# Patient Record
Sex: Male | Born: 1940 | Race: White | Hispanic: No | Marital: Married | Smoking: Former smoker
Health system: Southern US, Community
[De-identification: ages and names within clinical notes are randomized; demographics above are authoritative.]

## PROBLEM LIST (undated history)

## (undated) DIAGNOSIS — M1990 Unspecified osteoarthritis, unspecified site: Secondary | ICD-10-CM

## (undated) DIAGNOSIS — N390 Urinary tract infection, site not specified: Secondary | ICD-10-CM

## (undated) DIAGNOSIS — I10 Essential (primary) hypertension: Secondary | ICD-10-CM

## (undated) DIAGNOSIS — G510 Bell's palsy: Secondary | ICD-10-CM

## (undated) DIAGNOSIS — D3610 Benign neoplasm of peripheral nerves and autonomic nervous system, unspecified: Secondary | ICD-10-CM

## (undated) DIAGNOSIS — H9190 Unspecified hearing loss, unspecified ear: Secondary | ICD-10-CM

## (undated) DIAGNOSIS — C801 Malignant (primary) neoplasm, unspecified: Secondary | ICD-10-CM

## (undated) DIAGNOSIS — N400 Enlarged prostate without lower urinary tract symptoms: Secondary | ICD-10-CM

## (undated) DIAGNOSIS — R42 Dizziness and giddiness: Secondary | ICD-10-CM

## (undated) DIAGNOSIS — N1330 Unspecified hydronephrosis: Secondary | ICD-10-CM

## (undated) DIAGNOSIS — Z87448 Personal history of other diseases of urinary system: Secondary | ICD-10-CM

## (undated) DIAGNOSIS — R350 Frequency of micturition: Secondary | ICD-10-CM

## (undated) DIAGNOSIS — G839 Paralytic syndrome, unspecified: Secondary | ICD-10-CM

## (undated) DIAGNOSIS — Z87898 Personal history of other specified conditions: Secondary | ICD-10-CM

## (undated) DIAGNOSIS — N200 Calculus of kidney: Secondary | ICD-10-CM

## (undated) HISTORY — DX: Personal history of other diseases of urinary system: Z87.448

## (undated) HISTORY — PX: EYE SURGERY: SHX253

## (undated) HISTORY — PX: TRANSURETHRAL RESECTION OF BLADDER TUMOR WITH GYRUS (TURBT-GYRUS): SHX6458

## (undated) HISTORY — DX: Calculus of kidney: N20.0

## (undated) HISTORY — DX: Urinary tract infection, site not specified: N39.0

## (undated) HISTORY — PX: BACK SURGERY: SHX140

## (undated) HISTORY — DX: Personal history of other specified conditions: Z87.898

## (undated) HISTORY — DX: Frequency of micturition: R35.0

## (undated) HISTORY — DX: Unspecified hydronephrosis: N13.30

## (undated) HISTORY — PX: OTHER SURGICAL HISTORY: SHX169

---

## 2010-09-07 HISTORY — PX: BRAIN TUMOR EXCISION: SHX577

## 2011-09-17 DIAGNOSIS — Z981 Arthrodesis status: Secondary | ICD-10-CM | POA: Diagnosis not present

## 2011-09-17 DIAGNOSIS — M5030 Other cervical disc degeneration, unspecified cervical region: Secondary | ICD-10-CM | POA: Diagnosis not present

## 2011-09-17 DIAGNOSIS — M4802 Spinal stenosis, cervical region: Secondary | ICD-10-CM | POA: Diagnosis not present

## 2011-09-21 DIAGNOSIS — R7301 Impaired fasting glucose: Secondary | ICD-10-CM | POA: Diagnosis not present

## 2011-09-21 DIAGNOSIS — G510 Bell's palsy: Secondary | ICD-10-CM | POA: Diagnosis not present

## 2011-09-21 DIAGNOSIS — E119 Type 2 diabetes mellitus without complications: Secondary | ICD-10-CM | POA: Diagnosis not present

## 2011-09-21 DIAGNOSIS — I10 Essential (primary) hypertension: Secondary | ICD-10-CM | POA: Diagnosis not present

## 2011-09-21 DIAGNOSIS — C679 Malignant neoplasm of bladder, unspecified: Secondary | ICD-10-CM | POA: Diagnosis not present

## 2011-09-25 DIAGNOSIS — C679 Malignant neoplasm of bladder, unspecified: Secondary | ICD-10-CM | POA: Diagnosis not present

## 2011-10-08 DIAGNOSIS — H16109 Unspecified superficial keratitis, unspecified eye: Secondary | ICD-10-CM | POA: Diagnosis not present

## 2011-10-08 DIAGNOSIS — H2510 Age-related nuclear cataract, unspecified eye: Secondary | ICD-10-CM | POA: Diagnosis not present

## 2011-10-15 DIAGNOSIS — Z4789 Encounter for other orthopedic aftercare: Secondary | ICD-10-CM | POA: Diagnosis not present

## 2011-12-14 DIAGNOSIS — M5412 Radiculopathy, cervical region: Secondary | ICD-10-CM | POA: Diagnosis not present

## 2011-12-14 DIAGNOSIS — R7301 Impaired fasting glucose: Secondary | ICD-10-CM | POA: Diagnosis not present

## 2011-12-14 DIAGNOSIS — C679 Malignant neoplasm of bladder, unspecified: Secondary | ICD-10-CM | POA: Diagnosis not present

## 2011-12-14 DIAGNOSIS — Z01810 Encounter for preprocedural cardiovascular examination: Secondary | ICD-10-CM | POA: Diagnosis not present

## 2011-12-21 DIAGNOSIS — N139 Obstructive and reflux uropathy, unspecified: Secondary | ICD-10-CM | POA: Diagnosis not present

## 2011-12-21 DIAGNOSIS — N3289 Other specified disorders of bladder: Secondary | ICD-10-CM | POA: Diagnosis not present

## 2011-12-21 DIAGNOSIS — K219 Gastro-esophageal reflux disease without esophagitis: Secondary | ICD-10-CM | POA: Diagnosis not present

## 2011-12-21 DIAGNOSIS — C679 Malignant neoplasm of bladder, unspecified: Secondary | ICD-10-CM | POA: Diagnosis not present

## 2011-12-21 DIAGNOSIS — I10 Essential (primary) hypertension: Secondary | ICD-10-CM | POA: Diagnosis not present

## 2011-12-21 DIAGNOSIS — Z8551 Personal history of malignant neoplasm of bladder: Secondary | ICD-10-CM | POA: Diagnosis not present

## 2011-12-21 DIAGNOSIS — N401 Enlarged prostate with lower urinary tract symptoms: Secondary | ICD-10-CM | POA: Diagnosis not present

## 2011-12-29 DIAGNOSIS — M48061 Spinal stenosis, lumbar region without neurogenic claudication: Secondary | ICD-10-CM | POA: Diagnosis not present

## 2012-01-05 DIAGNOSIS — M6281 Muscle weakness (generalized): Secondary | ICD-10-CM | POA: Diagnosis not present

## 2012-02-02 DIAGNOSIS — M5412 Radiculopathy, cervical region: Secondary | ICD-10-CM | POA: Diagnosis not present

## 2012-03-08 DIAGNOSIS — M4802 Spinal stenosis, cervical region: Secondary | ICD-10-CM | POA: Diagnosis not present

## 2012-03-11 DIAGNOSIS — C675 Malignant neoplasm of bladder neck: Secondary | ICD-10-CM | POA: Diagnosis not present

## 2012-03-11 DIAGNOSIS — C679 Malignant neoplasm of bladder, unspecified: Secondary | ICD-10-CM | POA: Diagnosis not present

## 2012-03-14 DIAGNOSIS — R7301 Impaired fasting glucose: Secondary | ICD-10-CM | POA: Diagnosis not present

## 2012-03-14 DIAGNOSIS — I10 Essential (primary) hypertension: Secondary | ICD-10-CM | POA: Diagnosis not present

## 2012-03-14 DIAGNOSIS — M5412 Radiculopathy, cervical region: Secondary | ICD-10-CM | POA: Diagnosis not present

## 2012-03-14 DIAGNOSIS — Z01810 Encounter for preprocedural cardiovascular examination: Secondary | ICD-10-CM | POA: Diagnosis not present

## 2012-04-01 DIAGNOSIS — R7301 Impaired fasting glucose: Secondary | ICD-10-CM | POA: Diagnosis not present

## 2012-04-01 DIAGNOSIS — I10 Essential (primary) hypertension: Secondary | ICD-10-CM | POA: Diagnosis not present

## 2012-04-01 DIAGNOSIS — M5412 Radiculopathy, cervical region: Secondary | ICD-10-CM | POA: Diagnosis not present

## 2012-04-12 DIAGNOSIS — M5412 Radiculopathy, cervical region: Secondary | ICD-10-CM | POA: Diagnosis not present

## 2012-04-12 DIAGNOSIS — K219 Gastro-esophageal reflux disease without esophagitis: Secondary | ICD-10-CM | POA: Diagnosis not present

## 2012-04-12 DIAGNOSIS — R7301 Impaired fasting glucose: Secondary | ICD-10-CM | POA: Diagnosis not present

## 2012-04-12 DIAGNOSIS — M4802 Spinal stenosis, cervical region: Secondary | ICD-10-CM | POA: Diagnosis not present

## 2012-04-12 DIAGNOSIS — Z01818 Encounter for other preprocedural examination: Secondary | ICD-10-CM | POA: Diagnosis not present

## 2012-04-18 DIAGNOSIS — K219 Gastro-esophageal reflux disease without esophagitis: Secondary | ICD-10-CM | POA: Diagnosis not present

## 2012-04-18 DIAGNOSIS — I10 Essential (primary) hypertension: Secondary | ICD-10-CM | POA: Diagnosis not present

## 2012-04-18 DIAGNOSIS — M5000 Cervical disc disorder with myelopathy, unspecified cervical region: Secondary | ICD-10-CM | POA: Diagnosis not present

## 2012-04-18 DIAGNOSIS — C679 Malignant neoplasm of bladder, unspecified: Secondary | ICD-10-CM | POA: Diagnosis not present

## 2012-04-18 DIAGNOSIS — M4802 Spinal stenosis, cervical region: Secondary | ICD-10-CM | POA: Diagnosis not present

## 2012-04-18 DIAGNOSIS — M5020 Other cervical disc displacement, unspecified cervical region: Secondary | ICD-10-CM | POA: Diagnosis present

## 2012-04-18 DIAGNOSIS — Z8551 Personal history of malignant neoplasm of bladder: Secondary | ICD-10-CM | POA: Diagnosis not present

## 2012-04-18 DIAGNOSIS — E119 Type 2 diabetes mellitus without complications: Secondary | ICD-10-CM | POA: Diagnosis not present

## 2012-06-23 DIAGNOSIS — C679 Malignant neoplasm of bladder, unspecified: Secondary | ICD-10-CM | POA: Diagnosis not present

## 2012-07-29 DIAGNOSIS — Z23 Encounter for immunization: Secondary | ICD-10-CM | POA: Diagnosis not present

## 2012-09-08 DIAGNOSIS — N390 Urinary tract infection, site not specified: Secondary | ICD-10-CM | POA: Diagnosis not present

## 2012-09-08 DIAGNOSIS — C679 Malignant neoplasm of bladder, unspecified: Secondary | ICD-10-CM | POA: Diagnosis not present

## 2012-09-13 DIAGNOSIS — D333 Benign neoplasm of cranial nerves: Secondary | ICD-10-CM | POA: Diagnosis not present

## 2012-09-13 DIAGNOSIS — M5412 Radiculopathy, cervical region: Secondary | ICD-10-CM | POA: Diagnosis not present

## 2012-09-13 DIAGNOSIS — R7301 Impaired fasting glucose: Secondary | ICD-10-CM | POA: Diagnosis not present

## 2012-09-22 DIAGNOSIS — H2510 Age-related nuclear cataract, unspecified eye: Secondary | ICD-10-CM | POA: Diagnosis not present

## 2012-09-22 DIAGNOSIS — H16229 Keratoconjunctivitis sicca, not specified as Sjogren's, unspecified eye: Secondary | ICD-10-CM | POA: Diagnosis not present

## 2012-09-22 DIAGNOSIS — H16109 Unspecified superficial keratitis, unspecified eye: Secondary | ICD-10-CM | POA: Diagnosis not present

## 2012-10-06 DIAGNOSIS — H169 Unspecified keratitis: Secondary | ICD-10-CM | POA: Diagnosis not present

## 2012-10-06 DIAGNOSIS — G510 Bell's palsy: Secondary | ICD-10-CM | POA: Diagnosis not present

## 2012-10-06 DIAGNOSIS — H259 Unspecified age-related cataract: Secondary | ICD-10-CM | POA: Diagnosis not present

## 2012-10-17 DIAGNOSIS — H18459 Nodular corneal degeneration, unspecified eye: Secondary | ICD-10-CM | POA: Diagnosis not present

## 2012-12-08 DIAGNOSIS — C679 Malignant neoplasm of bladder, unspecified: Secondary | ICD-10-CM | POA: Diagnosis not present

## 2012-12-08 DIAGNOSIS — N390 Urinary tract infection, site not specified: Secondary | ICD-10-CM | POA: Diagnosis not present

## 2013-03-01 DIAGNOSIS — Z01810 Encounter for preprocedural cardiovascular examination: Secondary | ICD-10-CM | POA: Diagnosis not present

## 2013-03-01 DIAGNOSIS — I10 Essential (primary) hypertension: Secondary | ICD-10-CM | POA: Diagnosis not present

## 2013-03-01 DIAGNOSIS — C679 Malignant neoplasm of bladder, unspecified: Secondary | ICD-10-CM | POA: Diagnosis not present

## 2013-03-22 DIAGNOSIS — C679 Malignant neoplasm of bladder, unspecified: Secondary | ICD-10-CM | POA: Diagnosis not present

## 2013-03-22 DIAGNOSIS — K219 Gastro-esophageal reflux disease without esophagitis: Secondary | ICD-10-CM | POA: Diagnosis not present

## 2013-03-22 DIAGNOSIS — I10 Essential (primary) hypertension: Secondary | ICD-10-CM | POA: Diagnosis not present

## 2013-03-22 DIAGNOSIS — R7881 Bacteremia: Secondary | ICD-10-CM | POA: Diagnosis not present

## 2013-06-12 DIAGNOSIS — H16109 Unspecified superficial keratitis, unspecified eye: Secondary | ICD-10-CM | POA: Diagnosis not present

## 2013-08-11 DIAGNOSIS — C679 Malignant neoplasm of bladder, unspecified: Secondary | ICD-10-CM | POA: Diagnosis not present

## 2013-08-11 DIAGNOSIS — N390 Urinary tract infection, site not specified: Secondary | ICD-10-CM | POA: Diagnosis not present

## 2013-11-30 DIAGNOSIS — C679 Malignant neoplasm of bladder, unspecified: Secondary | ICD-10-CM | POA: Diagnosis not present

## 2013-11-30 DIAGNOSIS — N390 Urinary tract infection, site not specified: Secondary | ICD-10-CM | POA: Diagnosis not present

## 2014-01-10 DIAGNOSIS — C679 Malignant neoplasm of bladder, unspecified: Secondary | ICD-10-CM | POA: Diagnosis not present

## 2014-01-10 DIAGNOSIS — R339 Retention of urine, unspecified: Secondary | ICD-10-CM | POA: Diagnosis not present

## 2014-01-10 DIAGNOSIS — R319 Hematuria, unspecified: Secondary | ICD-10-CM | POA: Diagnosis not present

## 2014-01-10 DIAGNOSIS — N3000 Acute cystitis without hematuria: Secondary | ICD-10-CM | POA: Diagnosis not present

## 2014-01-10 DIAGNOSIS — R399 Unspecified symptoms and signs involving the genitourinary system: Secondary | ICD-10-CM | POA: Insufficient documentation

## 2014-01-10 DIAGNOSIS — Z8551 Personal history of malignant neoplasm of bladder: Secondary | ICD-10-CM | POA: Diagnosis not present

## 2014-01-10 DIAGNOSIS — N390 Urinary tract infection, site not specified: Secondary | ICD-10-CM | POA: Insufficient documentation

## 2014-01-10 DIAGNOSIS — Z8603 Personal history of neoplasm of uncertain behavior: Secondary | ICD-10-CM | POA: Insufficient documentation

## 2014-01-10 DIAGNOSIS — Z7189 Other specified counseling: Secondary | ICD-10-CM | POA: Diagnosis not present

## 2014-01-10 DIAGNOSIS — R3989 Other symptoms and signs involving the genitourinary system: Secondary | ICD-10-CM | POA: Diagnosis not present

## 2014-01-17 DIAGNOSIS — R109 Unspecified abdominal pain: Secondary | ICD-10-CM | POA: Diagnosis not present

## 2014-01-17 DIAGNOSIS — Z8551 Personal history of malignant neoplasm of bladder: Secondary | ICD-10-CM | POA: Diagnosis not present

## 2014-01-17 DIAGNOSIS — R319 Hematuria, unspecified: Secondary | ICD-10-CM | POA: Diagnosis not present

## 2014-01-17 DIAGNOSIS — N1330 Unspecified hydronephrosis: Secondary | ICD-10-CM | POA: Diagnosis not present

## 2014-01-31 DIAGNOSIS — Z8551 Personal history of malignant neoplasm of bladder: Secondary | ICD-10-CM | POA: Diagnosis not present

## 2014-01-31 DIAGNOSIS — D419 Neoplasm of uncertain behavior of unspecified urinary organ: Secondary | ICD-10-CM | POA: Diagnosis not present

## 2014-04-18 DIAGNOSIS — D496 Neoplasm of unspecified behavior of brain: Secondary | ICD-10-CM | POA: Diagnosis not present

## 2014-04-18 DIAGNOSIS — Z1331 Encounter for screening for depression: Secondary | ICD-10-CM | POA: Diagnosis not present

## 2014-04-18 DIAGNOSIS — I10 Essential (primary) hypertension: Secondary | ICD-10-CM | POA: Diagnosis not present

## 2014-04-18 DIAGNOSIS — Z9181 History of falling: Secondary | ICD-10-CM | POA: Diagnosis not present

## 2014-04-18 DIAGNOSIS — N400 Enlarged prostate without lower urinary tract symptoms: Secondary | ICD-10-CM | POA: Diagnosis not present

## 2014-04-18 DIAGNOSIS — C679 Malignant neoplasm of bladder, unspecified: Secondary | ICD-10-CM | POA: Diagnosis not present

## 2014-05-04 DIAGNOSIS — Z8551 Personal history of malignant neoplasm of bladder: Secondary | ICD-10-CM | POA: Diagnosis not present

## 2014-05-04 DIAGNOSIS — N329 Bladder disorder, unspecified: Secondary | ICD-10-CM | POA: Insufficient documentation

## 2014-05-29 DIAGNOSIS — R3129 Other microscopic hematuria: Secondary | ICD-10-CM | POA: Diagnosis not present

## 2014-05-29 DIAGNOSIS — N3090 Cystitis, unspecified without hematuria: Secondary | ICD-10-CM | POA: Diagnosis not present

## 2014-05-29 DIAGNOSIS — R319 Hematuria, unspecified: Secondary | ICD-10-CM | POA: Diagnosis not present

## 2014-05-29 DIAGNOSIS — N1330 Unspecified hydronephrosis: Secondary | ICD-10-CM | POA: Diagnosis not present

## 2014-05-29 DIAGNOSIS — Z8551 Personal history of malignant neoplasm of bladder: Secondary | ICD-10-CM | POA: Diagnosis not present

## 2014-05-29 DIAGNOSIS — N400 Enlarged prostate without lower urinary tract symptoms: Secondary | ICD-10-CM | POA: Diagnosis not present

## 2014-06-06 ENCOUNTER — Ambulatory Visit: Payer: Self-pay | Admitting: Urology

## 2014-06-06 DIAGNOSIS — Z79899 Other long term (current) drug therapy: Secondary | ICD-10-CM | POA: Diagnosis not present

## 2014-06-06 DIAGNOSIS — R3915 Urgency of urination: Secondary | ICD-10-CM | POA: Diagnosis not present

## 2014-06-06 DIAGNOSIS — Z87891 Personal history of nicotine dependence: Secondary | ICD-10-CM | POA: Diagnosis not present

## 2014-06-06 DIAGNOSIS — C679 Malignant neoplasm of bladder, unspecified: Secondary | ICD-10-CM | POA: Diagnosis not present

## 2014-06-06 DIAGNOSIS — Z889 Allergy status to unspecified drugs, medicaments and biological substances status: Secondary | ICD-10-CM | POA: Diagnosis not present

## 2014-06-06 DIAGNOSIS — N1330 Unspecified hydronephrosis: Secondary | ICD-10-CM | POA: Diagnosis not present

## 2014-06-06 DIAGNOSIS — N400 Enlarged prostate without lower urinary tract symptoms: Secondary | ICD-10-CM | POA: Diagnosis not present

## 2014-06-06 DIAGNOSIS — Z01812 Encounter for preprocedural laboratory examination: Secondary | ICD-10-CM | POA: Diagnosis not present

## 2014-06-06 DIAGNOSIS — I10 Essential (primary) hypertension: Secondary | ICD-10-CM | POA: Diagnosis not present

## 2014-06-06 DIAGNOSIS — R350 Frequency of micturition: Secondary | ICD-10-CM | POA: Diagnosis not present

## 2014-06-06 DIAGNOSIS — Z9889 Other specified postprocedural states: Secondary | ICD-10-CM | POA: Diagnosis not present

## 2014-06-06 DIAGNOSIS — G510 Bell's palsy: Secondary | ICD-10-CM | POA: Diagnosis not present

## 2014-06-06 LAB — URINALYSIS, COMPLETE
BILIRUBIN, UR: NEGATIVE
Bacteria: NONE SEEN
Blood: NEGATIVE
GLUCOSE, UR: NEGATIVE mg/dL (ref 0–75)
Ketone: NEGATIVE
NITRITE: NEGATIVE
Ph: 5 (ref 4.5–8.0)
Protein: NEGATIVE
Specific Gravity: 1.017 (ref 1.003–1.030)
Squamous Epithelial: 2

## 2014-06-06 LAB — CBC
HCT: 45.6 % (ref 40.0–52.0)
HGB: 14.8 g/dL (ref 13.0–18.0)
MCH: 31 pg (ref 26.0–34.0)
MCHC: 32.6 g/dL (ref 32.0–36.0)
MCV: 95 fL (ref 80–100)
PLATELETS: 201 10*3/uL (ref 150–440)
RBC: 4.79 10*6/uL (ref 4.40–5.90)
RDW: 12.9 % (ref 11.5–14.5)
WBC: 9.8 10*3/uL (ref 3.8–10.6)

## 2014-06-06 LAB — PROTIME-INR
INR: 1
Prothrombin Time: 13.3 secs (ref 11.5–14.7)

## 2014-06-06 LAB — BASIC METABOLIC PANEL
Anion Gap: 5 — ABNORMAL LOW (ref 7–16)
BUN: 19 mg/dL — AB (ref 7–18)
Calcium, Total: 8.5 mg/dL (ref 8.5–10.1)
Chloride: 105 mmol/L (ref 98–107)
Co2: 28 mmol/L (ref 21–32)
Creatinine: 1.2 mg/dL (ref 0.60–1.30)
EGFR (African American): >60
EGFR (Non-African Amer.): >60
Glucose: 107 mg/dL — ABNORMAL HIGH (ref 65–99)
Osmolality: 278 (ref 275–301)
POTASSIUM: 4.2 mmol/L (ref 3.5–5.1)
Sodium: 138 mmol/L (ref 136–145)

## 2014-06-06 LAB — APTT: Activated PTT: 27 secs (ref 23.6–35.9)

## 2014-06-08 LAB — URINE CULTURE

## 2014-06-13 ENCOUNTER — Ambulatory Visit: Payer: Self-pay | Admitting: Urology

## 2014-06-13 DIAGNOSIS — Z885 Allergy status to narcotic agent status: Secondary | ICD-10-CM | POA: Diagnosis not present

## 2014-06-13 DIAGNOSIS — N3020 Other chronic cystitis without hematuria: Secondary | ICD-10-CM | POA: Diagnosis not present

## 2014-06-13 DIAGNOSIS — Z809 Family history of malignant neoplasm, unspecified: Secondary | ICD-10-CM | POA: Diagnosis not present

## 2014-06-13 DIAGNOSIS — M1990 Unspecified osteoarthritis, unspecified site: Secondary | ICD-10-CM | POA: Diagnosis not present

## 2014-06-13 DIAGNOSIS — C679 Malignant neoplasm of bladder, unspecified: Secondary | ICD-10-CM | POA: Diagnosis not present

## 2014-06-13 DIAGNOSIS — Z79899 Other long term (current) drug therapy: Secondary | ICD-10-CM | POA: Diagnosis not present

## 2014-06-13 DIAGNOSIS — Z8551 Personal history of malignant neoplasm of bladder: Secondary | ICD-10-CM | POA: Diagnosis not present

## 2014-06-13 DIAGNOSIS — Z87891 Personal history of nicotine dependence: Secondary | ICD-10-CM | POA: Diagnosis not present

## 2014-06-13 DIAGNOSIS — N400 Enlarged prostate without lower urinary tract symptoms: Secondary | ICD-10-CM | POA: Diagnosis not present

## 2014-06-13 DIAGNOSIS — D414 Neoplasm of uncertain behavior of bladder: Secondary | ICD-10-CM | POA: Diagnosis not present

## 2014-06-13 DIAGNOSIS — Z87898 Personal history of other specified conditions: Secondary | ICD-10-CM | POA: Diagnosis not present

## 2014-06-20 LAB — PATHOLOGY REPORT

## 2014-06-22 DIAGNOSIS — N1330 Unspecified hydronephrosis: Secondary | ICD-10-CM | POA: Diagnosis not present

## 2014-06-22 DIAGNOSIS — Z8551 Personal history of malignant neoplasm of bladder: Secondary | ICD-10-CM | POA: Diagnosis not present

## 2014-06-22 DIAGNOSIS — N400 Enlarged prostate without lower urinary tract symptoms: Secondary | ICD-10-CM | POA: Diagnosis not present

## 2014-06-24 ENCOUNTER — Emergency Department: Payer: Self-pay | Admitting: Emergency Medicine

## 2014-06-24 DIAGNOSIS — R319 Hematuria, unspecified: Secondary | ICD-10-CM | POA: Diagnosis not present

## 2014-06-24 DIAGNOSIS — Z87891 Personal history of nicotine dependence: Secondary | ICD-10-CM | POA: Diagnosis not present

## 2014-06-24 LAB — URINALYSIS, COMPLETE
Bacteria: NONE SEEN
SPECIFIC GRAVITY: 1.022 (ref 1.003–1.030)
Squamous Epithelial: NONE SEEN
WBC UR: 803 /HPF (ref 0–5)

## 2014-06-24 LAB — BASIC METABOLIC PANEL
ANION GAP: 7 (ref 7–16)
BUN: 21 mg/dL — AB (ref 7–18)
CALCIUM: 7.8 mg/dL — AB (ref 8.5–10.1)
Chloride: 108 mmol/L — ABNORMAL HIGH (ref 98–107)
Co2: 27 mmol/L (ref 21–32)
Creatinine: 1.34 mg/dL — ABNORMAL HIGH (ref 0.60–1.30)
GFR CALC NON AF AMER: 56 — AB
GLUCOSE: 125 mg/dL — AB (ref 65–99)
OSMOLALITY: 288 (ref 275–301)
POTASSIUM: 3.9 mmol/L (ref 3.5–5.1)
SODIUM: 142 mmol/L (ref 136–145)

## 2014-06-24 LAB — CBC
HCT: 42.5 % (ref 40.0–52.0)
HGB: 13.7 g/dL (ref 13.0–18.0)
MCH: 30.7 pg (ref 26.0–34.0)
MCHC: 32.2 g/dL (ref 32.0–36.0)
MCV: 96 fL (ref 80–100)
Platelet: 164 10*3/uL (ref 150–440)
RBC: 4.46 10*6/uL (ref 4.40–5.90)
RDW: 13.1 % (ref 11.5–14.5)
WBC: 8.7 10*3/uL (ref 3.8–10.6)

## 2014-06-24 LAB — PROTIME-INR
INR: 1
Prothrombin Time: 13.1 secs (ref 11.5–14.7)

## 2014-09-21 DIAGNOSIS — R319 Hematuria, unspecified: Secondary | ICD-10-CM | POA: Diagnosis not present

## 2014-09-21 DIAGNOSIS — N3091 Cystitis, unspecified with hematuria: Secondary | ICD-10-CM | POA: Diagnosis not present

## 2014-09-21 DIAGNOSIS — Z8551 Personal history of malignant neoplasm of bladder: Secondary | ICD-10-CM | POA: Diagnosis not present

## 2014-09-26 ENCOUNTER — Ambulatory Visit: Payer: Self-pay | Admitting: Urology

## 2014-09-26 DIAGNOSIS — K7689 Other specified diseases of liver: Secondary | ICD-10-CM | POA: Diagnosis not present

## 2014-09-26 DIAGNOSIS — K760 Fatty (change of) liver, not elsewhere classified: Secondary | ICD-10-CM | POA: Diagnosis not present

## 2014-09-26 DIAGNOSIS — Z8551 Personal history of malignant neoplasm of bladder: Secondary | ICD-10-CM | POA: Diagnosis not present

## 2014-09-26 DIAGNOSIS — N200 Calculus of kidney: Secondary | ICD-10-CM | POA: Diagnosis not present

## 2014-09-26 DIAGNOSIS — N1330 Unspecified hydronephrosis: Secondary | ICD-10-CM | POA: Diagnosis not present

## 2014-10-23 DIAGNOSIS — Z23 Encounter for immunization: Secondary | ICD-10-CM | POA: Diagnosis not present

## 2014-10-23 DIAGNOSIS — N400 Enlarged prostate without lower urinary tract symptoms: Secondary | ICD-10-CM | POA: Diagnosis not present

## 2014-10-23 DIAGNOSIS — I10 Essential (primary) hypertension: Secondary | ICD-10-CM | POA: Diagnosis not present

## 2014-10-23 DIAGNOSIS — D496 Neoplasm of unspecified behavior of brain: Secondary | ICD-10-CM | POA: Diagnosis not present

## 2014-10-23 DIAGNOSIS — Z6831 Body mass index (BMI) 31.0-31.9, adult: Secondary | ICD-10-CM | POA: Diagnosis not present

## 2014-10-23 DIAGNOSIS — G510 Bell's palsy: Secondary | ICD-10-CM | POA: Diagnosis not present

## 2014-11-13 DIAGNOSIS — R7309 Other abnormal glucose: Secondary | ICD-10-CM | POA: Diagnosis not present

## 2014-11-13 DIAGNOSIS — G5600 Carpal tunnel syndrome, unspecified upper limb: Secondary | ICD-10-CM | POA: Diagnosis not present

## 2014-11-15 DIAGNOSIS — H269 Unspecified cataract: Secondary | ICD-10-CM | POA: Diagnosis not present

## 2014-11-19 DIAGNOSIS — G5601 Carpal tunnel syndrome, right upper limb: Secondary | ICD-10-CM | POA: Diagnosis not present

## 2014-11-19 DIAGNOSIS — G5602 Carpal tunnel syndrome, left upper limb: Secondary | ICD-10-CM | POA: Diagnosis not present

## 2014-12-17 ENCOUNTER — Ambulatory Visit
Admit: 2014-12-17 | Disposition: A | Discharge status: Home / Self Care | Payer: Self-pay | Attending: Gastroenterology | Admitting: Gastroenterology

## 2014-12-17 DIAGNOSIS — Z87891 Personal history of nicotine dependence: Secondary | ICD-10-CM | POA: Diagnosis not present

## 2014-12-17 DIAGNOSIS — D128 Benign neoplasm of rectum: Secondary | ICD-10-CM | POA: Diagnosis not present

## 2014-12-17 DIAGNOSIS — Z79899 Other long term (current) drug therapy: Secondary | ICD-10-CM | POA: Diagnosis not present

## 2014-12-17 DIAGNOSIS — H9190 Unspecified hearing loss, unspecified ear: Secondary | ICD-10-CM | POA: Diagnosis not present

## 2014-12-17 DIAGNOSIS — K621 Rectal polyp: Secondary | ICD-10-CM | POA: Diagnosis not present

## 2014-12-17 DIAGNOSIS — I10 Essential (primary) hypertension: Secondary | ICD-10-CM | POA: Diagnosis not present

## 2014-12-17 DIAGNOSIS — K5730 Diverticulosis of large intestine without perforation or abscess without bleeding: Secondary | ICD-10-CM | POA: Diagnosis not present

## 2014-12-17 DIAGNOSIS — Z888 Allergy status to other drugs, medicaments and biological substances status: Secondary | ICD-10-CM | POA: Diagnosis not present

## 2014-12-17 DIAGNOSIS — G510 Bell's palsy: Secondary | ICD-10-CM | POA: Diagnosis not present

## 2014-12-17 DIAGNOSIS — Z1211 Encounter for screening for malignant neoplasm of colon: Secondary | ICD-10-CM | POA: Diagnosis not present

## 2014-12-17 DIAGNOSIS — K635 Polyp of colon: Secondary | ICD-10-CM | POA: Diagnosis not present

## 2014-12-17 DIAGNOSIS — K640 First degree hemorrhoids: Secondary | ICD-10-CM | POA: Diagnosis not present

## 2014-12-17 DIAGNOSIS — D127 Benign neoplasm of rectosigmoid junction: Secondary | ICD-10-CM | POA: Diagnosis not present

## 2014-12-29 NOTE — Op Note (Signed)
PATIENT NAME:  Justin [name], Justin [name] MR#:  [id number] DATE OF BIRTH:  Jan 10, 1941  DATE OF PROCEDURE:  06/13/2014  PREOPERATIVE DIAGNOSES: History of bladder cancer, bladder lesion.  POSTOPERATIVE DIAGNOSES: History of bladder cancer, bladder lesion.  PROCEDURES PERFORMED: Cystoscopy, bladder biopsy, left retrograde pyelogram, instillation of chemotherapeutic agent mitomycin into the bladder.   ATTENDING SURGEON: Sherlynn Stalls, MD   ANESTHESIA: General anesthesia with mask LMA airway.   ESTIMATED BLOOD LOSS: Minimal.   DRAINS: A 16 French Foley catheter.   SPECIMENS: Bladder biopsy.   COMPLICATIONS: Unable to cannulate right UO.   INDICATION: This is a 74 year old male with a history of high-grade T1 bladder cancer found to have an expanding area of erythema within the bladder concerning for CIS. He was counseled to undergo cystoscopy, bladder biopsy with bilateral retrogrades in the operating room as well as instillation of mitomycin. Risks and benefits of the procedure were explained in detail. The patient agreed to proceed as planned.  DESCRIPTION OF PROCEDURE: The patient was correctly identified in the preoperative holding area and informed consent was obtained. He was brought to the operating suite and placed on the table in the supine position. At this time, universal timeout protocol was performed. All team members were identified. Venodyne boots were placed and he was administered 2 grams of IV Ancef in the perioperative period. He was then placed under general anesthesia and repositioned lower on the bed in the dorsal lithotomy position and prepped and draped in the standard surgical fashion. At this point in time, a rigid cystoscope, 22 French in diameter, was advanced per urethra into the bladder and the bladder was carefully inspected. There was a significantly erythematous patch extending from the right hemitrigone to the mid trigonal area very concerning for CIS. This had a somewhat  stellate appearance, perhaps overlying a previous resection scar from previous TURBT. The right UO was very difficult to identify in this very abnormal erythematous tissue. There were no papillary tumors noted within the entire bladder. This is the only isolated erythematous patch within the bladder. Attention was then turned to the left ureteral orifice, which was clearly identified and cannulated using a 5 [place] open-ended ureteral catheter. At this time, retrograde pyelogram was performed and the left ureter distended normally with no evidence of filling defects. There was no hydronephrosis within the left collecting system or any filling defects within the renal pelvis. At this point in time, an area felt to represent the right UO was identified; however, there was a band of tissue overlying this area with an abnormal contour, again presumably due to previous resection at the site, and it was unable to the cannulated effectively despite several attempts using a wire and open-ended ureteral stent. The decision was made not to resect this area and to pursue alternative upper tract imaging to avoid manipulation of the site. At this time, cold cup biopsy forceps were used to biopsy the erythematous patch. Approximately 5 such biopsies were taken and care was taken to avoid biopsy at the site felt to be the UO. A 26 French Olympus resectoscope was then advanced per urethra into the bladder, after serial dilation to the urethral meatus  in order to accommodate the scope. The loop was used to cauterize the biopsy sites for adequate hemostasis. Care again was taken to avoid cautery of the area felt to be the right UO. At the end of the case, the water was turned off and careful inspection of the area did reveal efflux  of clear urine from the site consistent with a patent ureteral orifice. Hemostasis was then deemed adequate and the scope was removed. A 16 French Foley catheter was advanced per urethra into the bladder  and 40 mg of mitomycin was instilled by the surgeon. The patient was then reversed from anesthesia and taken to the PACU in stable condition. There were no complications in this case. Mitomycin was drained approximately 1 hour after instillation and catheter was removed.    ____________________________ Sherlynn Stalls, MD ajb:TT D: 06/13/2014 17:25:28 ET T: 06/13/2014 20:57:58 ET JOB#: [id number]  cc: Sherlynn Stalls, MD, <Dictator> Sherlynn Stalls MD ELECTRONICALLY SIGNED 06/17/2014 9:04

## 2014-12-31 LAB — SURGICAL PATHOLOGY

## 2015-01-01 DIAGNOSIS — G5601 Carpal tunnel syndrome, right upper limb: Secondary | ICD-10-CM | POA: Diagnosis not present

## 2015-01-01 DIAGNOSIS — G5602 Carpal tunnel syndrome, left upper limb: Secondary | ICD-10-CM | POA: Diagnosis not present

## 2015-01-11 DIAGNOSIS — I10 Essential (primary) hypertension: Secondary | ICD-10-CM | POA: Diagnosis not present

## 2015-01-11 DIAGNOSIS — Z6828 Body mass index (BMI) 28.0-28.9, adult: Secondary | ICD-10-CM | POA: Diagnosis not present

## 2015-01-11 DIAGNOSIS — D333 Benign neoplasm of cranial nerves: Secondary | ICD-10-CM | POA: Diagnosis not present

## 2015-01-15 ENCOUNTER — Other Ambulatory Visit: Payer: Self-pay | Admitting: Neurosurgery

## 2015-01-15 DIAGNOSIS — G5600 Carpal tunnel syndrome, unspecified upper limb: Secondary | ICD-10-CM | POA: Diagnosis not present

## 2015-01-15 DIAGNOSIS — Z6830 Body mass index (BMI) 30.0-30.9, adult: Secondary | ICD-10-CM | POA: Diagnosis not present

## 2015-01-15 DIAGNOSIS — I10 Essential (primary) hypertension: Secondary | ICD-10-CM | POA: Diagnosis not present

## 2015-01-15 DIAGNOSIS — D333 Benign neoplasm of cranial nerves: Secondary | ICD-10-CM | POA: Diagnosis not present

## 2015-01-17 ENCOUNTER — Encounter
Admission: RE | Admit: 2015-01-17 | Discharge: 2015-01-17 | Disposition: A | Discharge status: Home / Self Care | Payer: Medicare Other | Source: Ambulatory Visit | Attending: Specialist | Admitting: Specialist

## 2015-01-17 DIAGNOSIS — Z01812 Encounter for preprocedural laboratory examination: Secondary | ICD-10-CM | POA: Insufficient documentation

## 2015-01-17 DIAGNOSIS — G5600 Carpal tunnel syndrome, unspecified upper limb: Secondary | ICD-10-CM | POA: Insufficient documentation

## 2015-01-17 HISTORY — DX: Bell's palsy: G51.0

## 2015-01-17 HISTORY — DX: Malignant (primary) neoplasm, unspecified: C80.1

## 2015-01-17 HISTORY — DX: Essential (primary) hypertension: I10

## 2015-01-17 HISTORY — DX: Paralytic syndrome, unspecified: G83.9

## 2015-01-17 HISTORY — DX: Benign prostatic hyperplasia without lower urinary tract symptoms: N40.0

## 2015-01-17 LAB — POTASSIUM: Potassium: 4.1 mmol/L (ref 3.5–5.1)

## 2015-01-17 NOTE — OR Nursing (Signed)
CLEARED BY PCP DR [name]

## 2015-01-17 NOTE — Patient Instructions (Signed)
  Your procedure is scheduled on:01/25/15 Report to Day Surgery. To find out your arrival time please call [phone] between 1PM - 3PM on 01/24/15.  Remember: Instructions that are not followed completely may result in serious medical risk, up to and including death, or upon the discretion of your surgeon and anesthesiologist your surgery may need to be rescheduled.    _x___ 1. Do not eat food or drink liquids after midnight. No gum chewing or hard candies.     __x__ 2. No Alcohol for 24 hours before or after surgery.   ____ 3. Bring all medications with you on the day of surgery if instructed.    __x__ 4. Notify your doctor if there is any change in your medical condition     (cold, fever, infections).     Do not wear jewelry, make-up, hairpins, clips or nail polish.  Do not wear lotions, powders, or perfumes. You may wear deodorant.  Do not shave 48 hours prior to surgery. Men may shave face and neck.  Do not bring valuables to the hospital.    [facility] is not responsible for any belongings or valuables.               Contacts, dentures or bridgework may not be worn into surgery.  Leave your suitcase in the car. After surgery it may be brought to your room.  For patients admitted to the hospital, discharge time is determined by your                treatment team.   Patients discharged the day of surgery will not be allowed to drive home.   Please read over the following fact sheets that you were given:   ____ Take these medicines the morning of surgery with A SIP OF WATER:    1.  2.   3.   4.  5.  6.  ____ Fleet Enema (as directed)   ____ Use CHG Soap as directed  ____ Use inhalers on the day of surgery  ____ Stop metformin 2 days prior to surgery    ____ Take 1/2 of usual insulin dose the night before surgery and none on the morning of surgery.   ____ Stop Coumadin/Plavix/aspirin on _x___ Stop Anti-inflammatories on check with Dr Tamala Julian   ____ Stop  supplements until after surgery.    ____ Bring C-Pap to the hospital.

## 2015-01-23 ENCOUNTER — Ambulatory Visit
Admission: RE | Admit: 2015-01-23 | Discharge: 2015-01-23 | Disposition: A | Discharge status: Home / Self Care | Payer: Medicare Other | Source: Ambulatory Visit | Attending: Neurosurgery | Admitting: Neurosurgery

## 2015-01-23 DIAGNOSIS — R51 Headache: Secondary | ICD-10-CM | POA: Diagnosis not present

## 2015-01-23 DIAGNOSIS — J328 Other chronic sinusitis: Secondary | ICD-10-CM | POA: Diagnosis not present

## 2015-01-23 DIAGNOSIS — D333 Benign neoplasm of cranial nerves: Secondary | ICD-10-CM | POA: Diagnosis not present

## 2015-01-23 DIAGNOSIS — D3611 Benign neoplasm of peripheral nerves and autonomic nervous system of face, head, and neck: Secondary | ICD-10-CM | POA: Diagnosis not present

## 2015-01-23 DIAGNOSIS — G9389 Other specified disorders of brain: Secondary | ICD-10-CM | POA: Insufficient documentation

## 2015-01-23 MED ORDER — GADOBENATE DIMEGLUMINE 529 MG/ML IV SOLN
18.0000 mL | Freq: Once | INTRAVENOUS | Status: AC | PRN
  Administered 2015-01-23: 18 mL via INTRAVENOUS

## 2015-01-25 ENCOUNTER — Ambulatory Visit: Payer: Medicare Other | Admitting: Certified Registered Nurse Anesthetist

## 2015-01-25 ENCOUNTER — Encounter
Admission: RE | Disposition: A | Discharge status: Home / Self Care | Payer: Self-pay | Source: Ambulatory Visit | Attending: Specialist

## 2015-01-25 ENCOUNTER — Ambulatory Visit
Admission: RE | Admit: 2015-01-25 | Discharge: 2015-01-25 | Disposition: A | Discharge status: Home / Self Care | Payer: Medicare Other | Source: Ambulatory Visit | Attending: Specialist | Admitting: Specialist

## 2015-01-25 DIAGNOSIS — Z8489 Family history of other specified conditions: Secondary | ICD-10-CM | POA: Diagnosis not present

## 2015-01-25 DIAGNOSIS — Z8551 Personal history of malignant neoplasm of bladder: Secondary | ICD-10-CM | POA: Diagnosis not present

## 2015-01-25 DIAGNOSIS — N400 Enlarged prostate without lower urinary tract symptoms: Secondary | ICD-10-CM | POA: Insufficient documentation

## 2015-01-25 DIAGNOSIS — Z885 Allergy status to narcotic agent status: Secondary | ICD-10-CM | POA: Insufficient documentation

## 2015-01-25 DIAGNOSIS — Z87891 Personal history of nicotine dependence: Secondary | ICD-10-CM | POA: Insufficient documentation

## 2015-01-25 DIAGNOSIS — I10 Essential (primary) hypertension: Secondary | ICD-10-CM | POA: Insufficient documentation

## 2015-01-25 DIAGNOSIS — G5600 Carpal tunnel syndrome, unspecified upper limb: Secondary | ICD-10-CM | POA: Diagnosis not present

## 2015-01-25 DIAGNOSIS — Z823 Family history of stroke: Secondary | ICD-10-CM | POA: Insufficient documentation

## 2015-01-25 DIAGNOSIS — G5602 Carpal tunnel syndrome, left upper limb: Secondary | ICD-10-CM | POA: Diagnosis not present

## 2015-01-25 DIAGNOSIS — Z833 Family history of diabetes mellitus: Secondary | ICD-10-CM | POA: Insufficient documentation

## 2015-01-25 DIAGNOSIS — Z79899 Other long term (current) drug therapy: Secondary | ICD-10-CM | POA: Diagnosis not present

## 2015-01-25 DIAGNOSIS — G5601 Carpal tunnel syndrome, right upper limb: Secondary | ICD-10-CM | POA: Insufficient documentation

## 2015-01-25 DIAGNOSIS — Z8249 Family history of ischemic heart disease and other diseases of the circulatory system: Secondary | ICD-10-CM | POA: Insufficient documentation

## 2015-01-25 DIAGNOSIS — G510 Bell's palsy: Secondary | ICD-10-CM | POA: Insufficient documentation

## 2015-01-25 HISTORY — PX: CARPAL TUNNEL RELEASE: SHX101

## 2015-01-25 SURGERY — CARPAL TUNNEL RELEASE
Anesthesia: General | Laterality: Bilateral | Wound class: Clean

## 2015-01-25 MED ORDER — GLYCOPYRROLATE 0.2 MG/ML IJ SOLN
INTRAMUSCULAR | Status: DC | PRN
  Administered 2015-01-25: 0.2 mg via INTRAVENOUS

## 2015-01-25 MED ORDER — MIDAZOLAM HCL 2 MG/2ML IJ SOLN
INTRAMUSCULAR | Status: DC | PRN
  Administered 2015-01-25: 2 mg via INTRAVENOUS

## 2015-01-25 MED ORDER — ONDANSETRON HCL 4 MG/2ML IJ SOLN: 4.0000 mg | Freq: Once | INTRAMUSCULAR | Status: DC | PRN

## 2015-01-25 MED ORDER — LIDOCAINE HCL (CARDIAC) 20 MG/ML IV SOLN
INTRAVENOUS | Status: DC | PRN
  Administered 2015-01-25: 100 mg via INTRAVENOUS

## 2015-01-25 MED ORDER — FENTANYL CITRATE (PF) 100 MCG/2ML IJ SOLN
INTRAMUSCULAR | Status: DC | PRN
  Administered 2015-01-25 (×6): 25 ug via INTRAVENOUS

## 2015-01-25 MED ORDER — LACTATED RINGERS IV SOLN
INTRAVENOUS | Status: DC
  Administered 2015-01-25 (×2): via INTRAVENOUS

## 2015-01-25 MED ORDER — ONDANSETRON HCL 4 MG/2ML IJ SOLN
INTRAMUSCULAR | Status: DC | PRN
  Administered 2015-01-25: 4 mg via INTRAVENOUS

## 2015-01-25 MED ORDER — FENTANYL CITRATE (PF) 100 MCG/2ML IJ SOLN: 25.0000 ug | INTRAMUSCULAR | Status: DC | PRN

## 2015-01-25 MED ORDER — KETOROLAC TROMETHAMINE 30 MG/ML IJ SOLN
INTRAMUSCULAR | Status: DC | PRN
  Administered 2015-01-25: 30 mg via INTRAVENOUS

## 2015-01-25 MED ORDER — HYDROCODONE-ACETAMINOPHEN 5-325 MG PO TABS: 1.0000 | ORAL_TABLET | Freq: Four times a day (QID) | ORAL | Status: DC | PRN

## 2015-01-25 MED ORDER — PHENYLEPHRINE HCL 10 MG/ML IJ SOLN
INTRAMUSCULAR | Status: DC | PRN
  Administered 2015-01-25: 100 ug via INTRAVENOUS
  Administered 2015-01-25 (×2): 200 ug via INTRAVENOUS
  Administered 2015-01-25: 100 ug via INTRAVENOUS
  Administered 2015-01-25 (×2): 200 ug via INTRAVENOUS

## 2015-01-25 MED ORDER — BUPIVACAINE HCL (PF) 0.5 % IJ SOLN
INTRAMUSCULAR | Status: AC
  Filled 2015-01-25: qty 30

## 2015-01-25 MED ORDER — PROPOFOL 10 MG/ML IV BOLUS
INTRAVENOUS | Status: DC | PRN
  Administered 2015-01-25: 140 mg via INTRAVENOUS

## 2015-01-25 MED ORDER — BUPIVACAINE HCL 0.5 % IJ SOLN
INTRAMUSCULAR | Status: DC | PRN
Start: 2015-01-25 — End: 2015-01-25
  Administered 2015-01-25: 12 mL

## 2015-01-25 SURGICAL SUPPLY — 25 items
BANDAGE ELASTIC 3 CLIP NS LF (GAUZE/BANDAGES/DRESSINGS) ×2 IMPLANT
BANDAGE ELASTIC 3 CLIP ST LF (GAUZE/BANDAGES/DRESSINGS) ×2 IMPLANT
BNDG ESMARK 4X12 TAN STRL LF (GAUZE/BANDAGES/DRESSINGS) ×2 IMPLANT
CANISTER SUCT 1200ML W/VALVE (MISCELLANEOUS) ×2 IMPLANT
CAST PADDING 3X4FT ST 30246 (SOFTGOODS) ×2
CHLORAPREP W/TINT 26ML (MISCELLANEOUS) ×4 IMPLANT
CUFF TOURN 18 STER (MISCELLANEOUS) ×2 IMPLANT
DRSG TEGADERM 2-3/8X2-3/4 SM (GAUZE/BANDAGES/DRESSINGS) ×2 IMPLANT
GAUZE PETRO XEROFOAM 1X8 (MISCELLANEOUS) ×2 IMPLANT
GAUZE SPONGE 4X4 12PLY STRL (GAUZE/BANDAGES/DRESSINGS) ×2 IMPLANT
GLOVE BIO SURGEON STRL SZ7.5 (GLOVE) ×4 IMPLANT
GOWN STRL REUS W/ TWL LRG LVL3 (GOWN DISPOSABLE) ×2 IMPLANT
GOWN STRL REUS W/TWL LRG LVL3 (GOWN DISPOSABLE) ×2
KIT RM TURNOVER STRD PROC AR (KITS) ×2 IMPLANT
NS IRRIG 500ML POUR BTL (IV SOLUTION) ×2 IMPLANT
PACK EXTREMITY ARMC (MISCELLANEOUS) ×2 IMPLANT
PAD CAST CTTN 3X4 STRL (SOFTGOODS) ×2 IMPLANT
PAD GROUND ADULT SPLIT (MISCELLANEOUS) ×2 IMPLANT
PAD PREP 24X41 OB/GYN DISP (PERSONAL CARE ITEMS) IMPLANT
PADDING CAST 3IN STRL (MISCELLANEOUS) ×2
PADDING CAST BLEND 3X4 STRL (MISCELLANEOUS) ×2 IMPLANT
STOCKINETTE STRL 4IN 9604848 (GAUZE/BANDAGES/DRESSINGS) ×2 IMPLANT
SUT ETHILON 4-0 (SUTURE) ×1
SUT ETHILON 4-0 FS2 18XMFL BLK (SUTURE) ×1
SUTURE ETHLN 4-0 FS2 18XMF BLK (SUTURE) ×1 IMPLANT

## 2015-01-25 NOTE — Anesthesia Postprocedure Evaluation (Signed)
  Anesthesia Post-op Note  Patient: Justin [name]  Procedure(s) Performed: Procedure(s): CARPAL TUNNEL RELEASE (Bilateral)  Anesthesia type:General LMA  Patient location: PACU  Post pain: Pain level controlled  Post assessment: Post-op Vital signs reviewed, Patient's Cardiovascular Status Stable, Respiratory Function Stable, Patent Airway and No signs of Nausea or vomiting  Post vital signs: Reviewed and stable  Last Vitals:  Filed Vitals:   01/25/15 1124  BP: 143/111  Pulse: 64  Temp:   Resp: 18    Level of consciousness: awake, alert  and patient cooperative  Complications: No apparent anesthesia complications

## 2015-01-25 NOTE — Discharge Instructions (Addendum)
Elevate arms at all times May remove entire bandages in  24 hours, bathe,get wet, apply Bandaid and wear braces prn  AMBULATORY SURGERY  DISCHARGE INSTRUCTIONS   1) The drugs that you were given will stay in your system until tomorrow so for the next 24 hours you should not:  A) Drive an automobile B) Make any legal decisions C) Drink any alcoholic beverage   2) You may resume regular meals tomorrow.  Today it is better to start with liquids and gradually work up to solid foods.  You may eat anything you prefer, but it is better to start with liquids, then soup and crackers, and gradually work up to solid foods.   3) Please notify your doctor immediately if you have any unusual bleeding, trouble breathing, redness and pain at the surgery site, drainage, fever, or pain not relieved by medication.

## 2015-01-25 NOTE — Transfer of Care (Signed)
Immediate Anesthesia Transfer of Care Note  Patient: Justin [name]  Procedure(s) Performed: Procedure(s): CARPAL TUNNEL RELEASE (Bilateral)  Patient Location: PACU  Anesthesia Type:General  Level of Consciousness: awake, alert  and oriented  Airway & Oxygen Therapy: Patient Spontanous Breathing and Patient connected to face mask oxygen  Post-op Assessment: Report given to RN and Post -op Vital signs reviewed and stable  Post vital signs: Reviewed and stable  Last Vitals:  Filed Vitals:   01/25/15 0956  BP: 159/81  Pulse: 66  Temp: 36.3 C  Resp:     Complications: No apparent anesthesia complications

## 2015-01-25 NOTE — Op Note (Signed)
[name], [name] NO.:  [id number]  MEDICAL RECORD NO.:  [id number]  LOCATION:  [facility]                         FACILITY:  [facility]  PHYSICIAN:  Margaretmary Eddy, MD        DATE OF BIRTH:  11/30/1940  DATE OF PROCEDURE:  01/25/2015 DATE OF DISCHARGE:  01/25/2015                              OPERATIVE REPORT   PREOPERATIVE DIAGNOSIS:  Bilateral carpal tunnel syndrome.  POSTOPERATIVE DIAGNOSIS:  Bilateral carpal tunnel syndrome.  PROCEDURE PERFORMED: 1. Right carpal tunnel release. 2. Left carpal tunnel release.  SURGEON:  Margaretmary Eddy, MD  ANESTHESIA:  General.  COMPLICATIONS:  None.  TOURNIQUET TIME:  16 minutes on the right and 21 minutes on the left.  DESCRIPTION OF PROCEDURE:  After adequate induction of general anesthesia, both upper extremities were  thoroughly prepped with alcohol and ChloraPrep, and draped in standard sterile fashion.  Identical procedures were performed on each side.  The extremity is wrapped out with Esmarch bandage and pneumatic tourniquet elevated to 250 mmHg. Standard volar carpal tunnel incision is made under loupe magnification and the dissection carried down to the transverse retinacular ligament. Incision in the midportion is made with the knife, and then a distal release is performed with the small scissors, and the proximal release is performed with the small scissors and the carpal tunnel scissors. There is seen to be severe compression of the median nerve on each side. Careful check for a mass lesion or other problem is done and none are seen. Careful check is made both proximally and distally to ensure that complete release has been obtained.  The wound is thoroughly irrigated multiple times.  Skin edges are closed with 4-0 nylon.  Subcutaneous tissue is infiltrated with 0.5% plain Marcaine.  A soft bulky dressing is applied, tourniquet is released.  The patient is returned to the recovery room in satisfactory condition  having tolerated the procedure quite well.          ______________________________ Margaretmary Eddy, MD    CS/MEDQ  D:  01/25/2015  T:  01/25/2015  Job:  [id number]

## 2015-01-25 NOTE — Brief Op Note (Signed)
01/25/2015  10:03 AM  PATIENT:  Micael Hampshire Stavely  74 y.o. male  PRE-OPERATIVE DIAGNOSIS:  carpal tunnel syndrome  POST-OPERATIVE DIAGNOSIS:  carpal tunnel syndrome  PROCEDURE:  Procedure(s): CARPAL TUNNEL RELEASE (Bilateral)  SURGEON:  Surgeon(s) and Role:    * Christophe Louis, MD - Primary  PHYSICIAN ASSISTANT:   ASSISTANTS: none   ANESTHESIA:   general  EBL:  Total I/O In: 1000 [I.V.:1000] Out: 5 [Blood:5]  BLOOD ADMINISTERED:none  DRAINS: none   LOCAL MEDICATIONS USED:  MARCAINE     SPECIMEN:  No Specimen  DISPOSITION OF SPECIMEN:  N/A  COUNTS:  YES  TOURNIQUET:    DICTATION: .Other Dictation: Dictation Number [id number]  PLAN OF CARE: Discharge to home after PACU  PATIENT DISPOSITION:  PACU - hemodynamically stable.   Delay start of Pharmacological VTE agent (>24hrs) due to surgical blood loss or risk of bleeding: not applicable

## 2015-01-25 NOTE — Anesthesia Preprocedure Evaluation (Signed)
Anesthesia Evaluation  Patient identified by MRN, date of birth, ID band Patient awake    Reviewed: Allergy & Precautions, H&P , NPO status , Patient's Chart, lab work & pertinent test results, reviewed documented beta blocker date and time   Airway Mallampati: III  TM Distance: >3 FB Neck ROM: full  Mouth opening: Limited Mouth Opening  Dental  (+) Edentulous Upper   Pulmonary Current Smoker, former smoker,          Cardiovascular hypertension, Rhythm:regular Rate:Normal     Neuro/Psych    GI/Hepatic   Endo/Other    Renal/GU      Musculoskeletal   Abdominal   Peds  Hematology   Anesthesia Other Findings   Reproductive/Obstetrics                             Anesthesia Physical Anesthesia Plan  ASA: III  Anesthesia Plan: General LMA   Post-op Pain Management:    Induction:   Airway Management Planned:   Additional Equipment:   Intra-op Plan:   Post-operative Plan:   Informed Consent: I have reviewed the patients History and Physical, chart, labs and discussed the procedure including the risks, benefits and alternatives for the proposed anesthesia with the patient or authorized representative who has indicated his/her understanding and acceptance.     Plan Discussed with: CRNA  Anesthesia Plan Comments:         Anesthesia Quick Evaluation

## 2015-01-25 NOTE — H&P (Signed)
  74 year old male with bilateral carpal tunnel syndrome as documented in H and P appended to chart in the form of his office note.  Procedure fully explained to patient including expected result and post op rehab.  Heart and lungs are clear.  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  Plan: bilateral carpal tunnel release this am.

## 2015-01-28 ENCOUNTER — Encounter: Payer: Self-pay | Admitting: Specialist

## 2015-02-01 DIAGNOSIS — Z6828 Body mass index (BMI) 28.0-28.9, adult: Secondary | ICD-10-CM | POA: Diagnosis not present

## 2015-02-01 DIAGNOSIS — D333 Benign neoplasm of cranial nerves: Secondary | ICD-10-CM | POA: Diagnosis not present

## 2015-03-21 ENCOUNTER — Encounter: Payer: Self-pay | Admitting: *Deleted

## 2015-03-22 ENCOUNTER — Encounter: Payer: Self-pay | Admitting: Urology

## 2015-03-22 ENCOUNTER — Ambulatory Visit (INDEPENDENT_AMBULATORY_CARE_PROVIDER_SITE_OTHER): Payer: Medicare Other | Admitting: Urology

## 2015-03-22 VITALS — BP 143/82 | HR 77 | Resp 18 | Ht 70.0 in | Wt 203.1 lb

## 2015-03-22 DIAGNOSIS — N1330 Unspecified hydronephrosis: Secondary | ICD-10-CM

## 2015-03-22 DIAGNOSIS — N200 Calculus of kidney: Secondary | ICD-10-CM

## 2015-03-22 DIAGNOSIS — Z8551 Personal history of malignant neoplasm of bladder: Secondary | ICD-10-CM

## 2015-03-22 DIAGNOSIS — R319 Hematuria, unspecified: Secondary | ICD-10-CM | POA: Diagnosis not present

## 2015-03-22 DIAGNOSIS — N400 Enlarged prostate without lower urinary tract symptoms: Secondary | ICD-10-CM

## 2015-03-22 LAB — URINALYSIS, COMPLETE
Bilirubin, UA: NEGATIVE
Glucose, UA: NEGATIVE
Ketones, UA: NEGATIVE
Nitrite, UA: NEGATIVE
PH UA: 5 (ref 5.0–7.5)
Protein, UA: NEGATIVE
Specific Gravity, UA: 1.015 (ref 1.005–1.030)
Urobilinogen, Ur: 0.2 mg/dL (ref 0.2–1.0)

## 2015-03-22 LAB — MICROSCOPIC EXAMINATION

## 2015-03-22 MED ORDER — CIPROFLOXACIN HCL 500 MG PO TABS
500.0000 mg | ORAL_TABLET | Freq: Once | ORAL | Status: AC
  Administered 2015-03-22: 500 mg via ORAL

## 2015-03-22 MED ORDER — LIDOCAINE HCL 2 % EX GEL
1.0000 "application " | Freq: Once | CUTANEOUS | Status: AC
  Administered 2015-03-22: 1 via URETHRAL

## 2015-03-22 NOTE — Progress Notes (Signed)
03/22/2015 10:29 AM   Justin [name] 03/02/1941 [id number]  Referring provider: [name], PA-C [place], [place] [address]  Chief Complaint  Patient presents with  . Cysto    HPI: 74yo M dx bladder cancer in 07/2011, HgT1 and recurrent with LgTa 03/2013.  He returns today for routine surveillance cystoscopy.   He was formally a patient of Dr. Phil Dopp in [facility]). He underwent TURBT in 07/2011 for a tumor on the right lateral wall which revealed HG T1. Records do not indicate repeat TUR or BCG. He had a recurrence of LG Ta in 03/2013 identified on quarterly surveillance cystoscopy above the right UO.   He was also managed by Dr. Harrison Mons upon moving to [facility]. Work up included IVP and RUS showing mild right hydroureteronephrosis and possible bilateral non obstructing stones. He also underwent cystoscopy in 01/2014 which showed possible erythema on the right trigone and UO was difficult to identify. Urine cytology from that time was atypical. He elected to follow this lesion and cystoscopy on 05/04/14 showed progression of this lesions noting a 2x2 area in the midline of the bladder extending off to the right trigone with surrounding neovascularity concerning for CIS.   He was taken by myself to OR on 06/13/14 for left retrograde, bladder biopsy, and instillation of mitomycin. There was significant erythema around the right UO and trigone which was biopsied extensively. The right UO was identified but noted to have a small mucosal flap overlying consistent with previous resection of the UO and was unable to be cannulated for right RTG. Efflux of urine was seen from this ureter consent with patency. Pathology showed chronic inflammation but NO evidence of CIS. Urine cytology negative x 2. Follow up CT Urogram on 09/26/14 showed bilateral lower pole nonobstructing stones measuring 2 mm in the left and 3 on the right.  He was also  noted to have very mild bilateral hydroureteronephrsois, right greater than left.  He does have history of smoking, 2-3 ppd x 4-5 years, quit 40 years ago.   He also has a history of BPH on longstanding terazosin and finasteride. Symptoms stable.   No recent gross hematuria or dysuria.     PMH: Past Medical History  Diagnosis Date  . BPH (benign prostatic hyperplasia)   . Palsy     chronic facial nerve  . Hypertension   . Bell's palsy   . Cancer     bladder  . Urinary frequency   . Urinary tract infection   . History of urinary urgency   . Kidney stone   . Hydronephrosis, bilateral     Surgical History: Past Surgical History  Procedure Laterality Date  . Gamma knife      for acoustic neuroma  . Back surgery      cervical fusion  . Cervial fusion    . Carpal tunnel release Bilateral 01/25/2015    Procedure: CARPAL TUNNEL RELEASE;  Surgeon: Christophe Louis, MD;  Location: [facility] ORS;  Service: Orthopedics;  Laterality: Bilateral;  . Plate in head    . Brain tumor excision    . Transurethral resection of bladder tumor with gyrus (turbt-gyrus)    . Eye surgery      Home Medications:    Medication List       This list is accurate as of: 03/22/15 11:59 PM.  Always use your most recent med list.  finasteride 5 MG tablet  Commonly known as:  PROSCAR  Take 5 mg by mouth daily. pm     HYDROcodone-acetaminophen 5-325 MG per tablet  Commonly known as:  NORCO  Take 1 tablet by mouth every 6 (six) hours as needed for moderate pain.     lisinopril 20 MG tablet  Commonly known as:  PRINIVIL,ZESTRIL  Take 20 mg by mouth daily. pm     terazosin 5 MG capsule  Commonly known as:  HYTRIN  Take 5 mg by mouth at bedtime. pm     TH CRANBERRY CONCENTRATE PO  Take by mouth.        Allergies:  Allergies  Allergen Reactions  . Percocet [Oxycodone-Acetaminophen] Rash    Family History: Family History  Problem Relation Age of Onset  . Cancer  Mother     Social History:  reports that he quit smoking about 36 years ago. He has never used smokeless tobacco. He reports that he does not drink alcohol or use illicit drugs.   Physical Exam: BP 143/82 mmHg  Pulse 77  Resp 18  Ht 5\' 10"  (1.778 m)  Wt 203 lb 1.6 oz (92.126 kg)  BMI 29.14 kg/m2  Constitutional:  Alert and oriented, No acute distress. HEENT: [place] AT, moist mucus membranes.  Trachea midline, no masses. Cardiovascular: No clubbing, cyanosis, or edema. Respiratory: Normal respiratory effort, no increased work of breathing. GI: Abdomen is soft, nontender, nondistended, no abdominal masses GU: No CVA tenderness. Normal phallus, orthotopic meatus. Skin: No rashes, bruises or suspicious lesions. Neurologic: Grossly intact, no focal deficits, moving all 4 extremities. Psychiatric: Normal mood and affect.  Laboratory Data: Lab Results  Component Value Date   WBC 8.7 06/24/2014   HGB 13.7 06/24/2014   HCT 42.5 06/24/2014   MCV 96 06/24/2014   PLT 164 06/24/2014    Lab Results  Component Value Date   CREATININE 1.34* 06/24/2014    Urinalysis Results for orders placed or performed in visit on 03/22/15  Microscopic Examination  Result Value Ref Range   WBC, UA 11-30 (A) 0 -  5 /hpf   RBC, UA 3-10 (A) 0 -  2 /hpf   Epithelial Cells (non renal) 0-10 0 - 10 /hpf   Bacteria, UA Moderate (A) None seen/Few  Urinalysis, Complete  Result Value Ref Range   Specific Gravity, UA 1.015 1.005 - 1.030   pH, UA 5.0 5.0 - 7.5   Color, UA Yellow Yellow   Appearance Ur Clear Clear   Leukocytes, UA 1+ (A) Negative   Protein, UA Negative Negative/Trace   Glucose, UA Negative Negative   Ketones, UA Negative Negative   RBC, UA 1+ (A) Negative   Bilirubin, UA Negative Negative   Urobilinogen, Ur 0.2 0.2 - 1.0 mg/dL   Nitrite, UA Negative Negative   Microscopic Examination See below:     Pertinent Imaging: As above, CT Urogram 09/26/14  Cystoscopy Procedure Note  Patient  identification was confirmed, informed consent was obtained, and patient was prepped using Betadine solution.  Lidocaine jelly was administered per urethral meatus.    Preoperative abx where received prior to procedure.     Pre-Procedure: - Inspection reveals a normal caliber ureteral meatus.  Procedure: The flexible cystoscope was introduced without difficulty - No urethral strictures/lesions are present. - Enlarged prostate  - Normal bladder neck - Bilateral ureteral orifices identified - Bladder mucosa  reveals persistent chronic patchy erythema on the posterior wall bladder extending towards right trigone. There is a mucosal  flap over the right UO was noted at the time of previous biopsy. The lesions appear to be highly suspicious for CIS as previously noted s/p negative biopsy.   No papillary tumors. - No bladder stones - No trabeculation  Retroflexion in remarkable.   Post-Procedure: - Patient tolerated the procedure well   Assessment & Plan:    1. History of bladder cancer Cystoscopy today again concerning for CIS although previous biopsy was negative. Will  repeat urine cytology. Given appearance of bladder, would prefer to repeat cystoscopy in 3 months rather than 6 months. -f/u urine cytology- Will call with cytology results - Urinalysis, Complete - ciprofloxacin (CIPRO) tablet 500 mg; Take 1 tablet (500 mg total) by mouth once. - lidocaine (XYLOCAINE) 2 % jelly 1 application; Place 1 application into the urethra once.  2. BPH (benign prostatic hyperplasia) Stable symptoms on finasteride in terazosin. Continue these medications.  3. Kidney stones Icidental small bilateral lower pole stones. Asymptomatic. No history of previous nephrolithiasis. We'll continue to follow.  4. 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  No filling defects. Suspect this is related to previous  resection of the UOs on previous TURBT. Renal function preserved and stable.  Will continue to follow.   Return in about 3 months (around 06/22/2015) for cystoscopy.  Hollice Espy, MD  [facility] [address], [place], [place] [address] [phone]

## 2015-03-24 ENCOUNTER — Encounter: Payer: Self-pay | Admitting: Urology

## 2015-04-22 ENCOUNTER — Other Ambulatory Visit: Payer: Self-pay | Admitting: Urology

## 2015-04-23 ENCOUNTER — Telehealth: Payer: Self-pay

## 2015-04-23 NOTE — Telephone Encounter (Signed)
-----   Message from Hollice Espy, MD sent at 04/23/2015  9:49 AM EDT ----- Cytology negative.  Will plan for cysto in 3 months as scheduled.  Please let patient know.    Hollice Espy, MD

## 2015-04-23 NOTE — Telephone Encounter (Signed)
Spoke with pt in reference to cytology results. Pt voiced understanding.  

## 2015-05-16 DIAGNOSIS — H2513 Age-related nuclear cataract, bilateral: Secondary | ICD-10-CM | POA: Diagnosis not present

## 2015-05-20 DIAGNOSIS — I10 Essential (primary) hypertension: Secondary | ICD-10-CM | POA: Diagnosis not present

## 2015-05-20 DIAGNOSIS — N400 Enlarged prostate without lower urinary tract symptoms: Secondary | ICD-10-CM | POA: Diagnosis not present

## 2015-05-20 DIAGNOSIS — Z6832 Body mass index (BMI) 32.0-32.9, adult: Secondary | ICD-10-CM | POA: Diagnosis not present

## 2015-05-20 DIAGNOSIS — Z125 Encounter for screening for malignant neoplasm of prostate: Secondary | ICD-10-CM | POA: Diagnosis not present

## 2015-05-20 DIAGNOSIS — Q845 Enlarged and hypertrophic nails: Secondary | ICD-10-CM | POA: Diagnosis not present

## 2015-05-20 DIAGNOSIS — D333 Benign neoplasm of cranial nerves: Secondary | ICD-10-CM | POA: Diagnosis not present

## 2015-05-20 DIAGNOSIS — Z1389 Encounter for screening for other disorder: Secondary | ICD-10-CM | POA: Diagnosis not present

## 2015-05-20 DIAGNOSIS — Z9181 History of falling: Secondary | ICD-10-CM | POA: Diagnosis not present

## 2015-05-28 DIAGNOSIS — H2513 Age-related nuclear cataract, bilateral: Secondary | ICD-10-CM | POA: Diagnosis not present

## 2015-05-30 ENCOUNTER — Encounter: Payer: Self-pay | Admitting: *Deleted

## 2015-06-04 ENCOUNTER — Ambulatory Visit (INDEPENDENT_AMBULATORY_CARE_PROVIDER_SITE_OTHER): Payer: Medicare Other | Admitting: Podiatry

## 2015-06-04 ENCOUNTER — Encounter: Payer: Self-pay | Admitting: Podiatry

## 2015-06-04 VITALS — BP 163/82 | HR 80 | Resp 18

## 2015-06-04 DIAGNOSIS — B351 Tinea unguium: Secondary | ICD-10-CM

## 2015-06-04 DIAGNOSIS — M79676 Pain in unspecified toe(s): Secondary | ICD-10-CM

## 2015-06-04 NOTE — Progress Notes (Signed)
   Subjective:    Patient ID: Justin [name], male    DOB: 26-Jul-1941, 74 y.o.   MRN: [id number]  HPI  74 year old male presents the office with concerns of right toenail thickening, discoloration which causes irritation shoe gear and pain. He states that he's had multiple treatments for nail fungus without any relief. His been ongoing for approximate 30 years and has been progressive. He also states that the remainder of his nails are also thick, discolored and painful. He is unable to trim the nails himself. No recent redness or drainage. No recent injury or trauma. No other complaints at this time.  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      Objective:   Physical Exam AAO x3, NAD DP/PT pulses palpable bilaterally, CRT less than 3 seconds Protective sensation intact with Simms Weinstein monofilament, vibratory sensation intact, Achilles tendon reflex intact Nails are hypertrophic, dystrophic, brittle, discolored, elongated 10. Protective the right hallux nail significantly hypertrophic, dystrophic. There is tenderness in nails 1-5 bilaterally. No surrounding erythema or drainage. No other areas of tenderness to bilateral lower extremities. MMT 5/5, ROM WNL.  No open lesions or pre-ulcerative lesions.  No overlying edema, erythema, increase in warmth to bilateral lower extremities.  No pain with calf compression, swelling, warmth, erythema bilaterally.      Assessment & Plan:  74 year old male with symptomatic onychomycosis, onychodystrophy -Treatment options discussed including all alternatives, risks, and complications -Nail sharply debrided 10 without complication/bleeding. -I discussed and nail removal of the right hallux nail given the deformity. He wishes to hold off on that at this time. -If he would like to proceed with further treatment which she was inquiring about, I  discussed them Fungi-Nail. -Follow-up in 3 months or sooner if any problems arise. In the meantime, encouraged to call the office with any questions, concerns, change in symptoms.   Celesta Gentile, DPM

## 2015-06-04 NOTE — Patient Instructions (Signed)
Over the counter medication for nail fungus is called fungi-nail

## 2015-06-06 ENCOUNTER — Encounter
Admission: RE | Disposition: A | Discharge status: Home / Self Care | Payer: Self-pay | Source: Ambulatory Visit | Attending: Ophthalmology

## 2015-06-06 ENCOUNTER — Ambulatory Visit
Admission: RE | Admit: 2015-06-06 | Discharge: 2015-06-06 | Disposition: A | Discharge status: Home / Self Care | Payer: Medicare Other | Source: Ambulatory Visit | Attending: Ophthalmology | Admitting: Ophthalmology

## 2015-06-06 ENCOUNTER — Ambulatory Visit: Payer: Medicare Other | Admitting: Certified Registered Nurse Anesthetist

## 2015-06-06 ENCOUNTER — Encounter: Payer: Self-pay | Admitting: *Deleted

## 2015-06-06 DIAGNOSIS — Z885 Allergy status to narcotic agent status: Secondary | ICD-10-CM | POA: Diagnosis not present

## 2015-06-06 DIAGNOSIS — R42 Dizziness and giddiness: Secondary | ICD-10-CM | POA: Diagnosis not present

## 2015-06-06 DIAGNOSIS — Z8551 Personal history of malignant neoplasm of bladder: Secondary | ICD-10-CM | POA: Insufficient documentation

## 2015-06-06 DIAGNOSIS — H2513 Age-related nuclear cataract, bilateral: Secondary | ICD-10-CM | POA: Diagnosis not present

## 2015-06-06 DIAGNOSIS — K219 Gastro-esophageal reflux disease without esophagitis: Secondary | ICD-10-CM | POA: Diagnosis not present

## 2015-06-06 DIAGNOSIS — G510 Bell's palsy: Secondary | ICD-10-CM | POA: Insufficient documentation

## 2015-06-06 DIAGNOSIS — H9191 Unspecified hearing loss, right ear: Secondary | ICD-10-CM | POA: Diagnosis not present

## 2015-06-06 DIAGNOSIS — H2512 Age-related nuclear cataract, left eye: Secondary | ICD-10-CM | POA: Diagnosis not present

## 2015-06-06 DIAGNOSIS — Z87891 Personal history of nicotine dependence: Secondary | ICD-10-CM | POA: Diagnosis not present

## 2015-06-06 DIAGNOSIS — I10 Essential (primary) hypertension: Secondary | ICD-10-CM | POA: Diagnosis not present

## 2015-06-06 DIAGNOSIS — D333 Benign neoplasm of cranial nerves: Secondary | ICD-10-CM | POA: Diagnosis not present

## 2015-06-06 DIAGNOSIS — M1990 Unspecified osteoarthritis, unspecified site: Secondary | ICD-10-CM | POA: Insufficient documentation

## 2015-06-06 HISTORY — DX: Benign neoplasm of peripheral nerves and autonomic nervous system, unspecified: D36.10

## 2015-06-06 HISTORY — DX: Unspecified hearing loss, unspecified ear: H91.90

## 2015-06-06 HISTORY — DX: Unspecified osteoarthritis, unspecified site: M19.90

## 2015-06-06 HISTORY — PX: CATARACT EXTRACTION W/PHACO: SHX586

## 2015-06-06 HISTORY — DX: Dizziness and giddiness: R42

## 2015-06-06 SURGERY — PHACOEMULSIFICATION, CATARACT, WITH IOL INSERTION
Anesthesia: Monitor Anesthesia Care | Site: Eye | Laterality: Left | Wound class: Clean

## 2015-06-06 MED ORDER — BSS IO SOLN
INTRAOCULAR | Status: DC | PRN
  Administered 2015-06-06: 200 mL via INTRAOCULAR

## 2015-06-06 MED ORDER — SODIUM CHLORIDE 0.9 % IV SOLN
INTRAVENOUS | Status: DC
  Administered 2015-06-06: 08:00:00 via INTRAVENOUS

## 2015-06-06 MED ORDER — MIDAZOLAM HCL 2 MG/2ML IJ SOLN
INTRAMUSCULAR | Status: DC | PRN
  Administered 2015-06-06: 1 mg via INTRAVENOUS

## 2015-06-06 MED ORDER — CYCLOPENTOLATE HCL 2 % OP SOLN
1.0000 [drp] | OPHTHALMIC | Status: AC | PRN
  Administered 2015-06-06 (×4): 1 [drp] via OPHTHALMIC

## 2015-06-06 MED ORDER — MOXIFLOXACIN HCL 0.5 % OP SOLN
OPHTHALMIC | Status: DC | PRN
  Administered 2015-06-06: 1 [drp] via OPHTHALMIC

## 2015-06-06 MED ORDER — TETRACAINE HCL 0.5 % OP SOLN
1.0000 [drp] | OPHTHALMIC | Status: DC | PRN
  Administered 2015-06-06: 1 [drp] via OPHTHALMIC

## 2015-06-06 MED ORDER — TETRACAINE HCL 0.5 % OP SOLN
OPHTHALMIC | Status: AC
  Administered 2015-06-06: 1 [drp] via OPHTHALMIC
  Filled 2015-06-06: qty 2

## 2015-06-06 MED ORDER — CEFUROXIME OPHTHALMIC INJECTION 1 MG/0.1 ML
INJECTION | OPHTHALMIC | Status: AC
  Filled 2015-06-06: qty 0.1

## 2015-06-06 MED ORDER — CYCLOPENTOLATE HCL 2 % OP SOLN
OPHTHALMIC | Status: AC
  Administered 2015-06-06: 1 [drp] via OPHTHALMIC
  Filled 2015-06-06: qty 2

## 2015-06-06 MED ORDER — EPINEPHRINE HCL 1 MG/ML IJ SOLN
INTRAMUSCULAR | Status: AC
  Filled 2015-06-06: qty 1

## 2015-06-06 MED ORDER — PHENYLEPHRINE HCL 10 % OP SOLN
OPHTHALMIC | Status: AC
  Administered 2015-06-06: 1 [drp] via OPHTHALMIC
  Filled 2015-06-06: qty 5

## 2015-06-06 MED ORDER — NA HYALUR & NA CHOND-NA HYALUR 0.4-0.35 ML IO KIT
PACK | INTRAOCULAR | Status: DC | PRN
  Administered 2015-06-06: .75 mL via INTRAOCULAR

## 2015-06-06 MED ORDER — PHENYLEPHRINE HCL 10 % OP SOLN
1.0000 [drp] | OPHTHALMIC | Status: AC | PRN
  Administered 2015-06-06 (×4): 1 [drp] via OPHTHALMIC

## 2015-06-06 MED ORDER — LIDOCAINE HCL (PF) 4 % IJ SOLN
INTRAOCULAR | Status: DC | PRN
  Administered 2015-06-06: 4 mL via OPHTHALMIC

## 2015-06-06 MED ORDER — MOXIFLOXACIN HCL 0.5 % OP SOLN
OPHTHALMIC | Status: AC
  Administered 2015-06-06: 1 [drp] via OPHTHALMIC
  Filled 2015-06-06: qty 3

## 2015-06-06 MED ORDER — CEFUROXIME OPHTHALMIC INJECTION 1 MG/0.1 ML
INJECTION | OPHTHALMIC | Status: DC | PRN
  Administered 2015-06-06: 0.1 mL via INTRACAMERAL

## 2015-06-06 MED ORDER — FENTANYL CITRATE (PF) 100 MCG/2ML IJ SOLN
INTRAMUSCULAR | Status: DC | PRN
  Administered 2015-06-06: 25 ug via INTRAVENOUS

## 2015-06-06 MED ORDER — MOXIFLOXACIN HCL 0.5 % OP SOLN
1.0000 [drp] | OPHTHALMIC | Status: AC | PRN
  Administered 2015-06-06 (×3): 1 [drp] via OPHTHALMIC

## 2015-06-06 MED ORDER — NA HYALUR & NA CHOND-NA HYALUR 0.55-0.5 ML IO KIT
PACK | INTRAOCULAR | Status: AC
  Filled 2015-06-06: qty 1.05

## 2015-06-06 MED ORDER — LIDOCAINE HCL (PF) 4 % IJ SOLN
INTRAMUSCULAR | Status: AC
  Filled 2015-06-06: qty 5

## 2015-06-06 SURGICAL SUPPLY — 22 items
CANNULA ANT/CHMB 27GA (MISCELLANEOUS) ×6 IMPLANT
CUP MEDICINE 2OZ PLAST GRAD ST (MISCELLANEOUS) ×3 IMPLANT
GLOVE BIO SURGEON STRL SZ7 (GLOVE) ×3 IMPLANT
GLOVE SURG LX 6.5 MICRO (GLOVE) ×4
GLOVE SURG LX STRL 6.5 MICRO (GLOVE) ×2 IMPLANT
GOWN STRL REUS W/ TWL LRG LVL3 (GOWN DISPOSABLE) ×2 IMPLANT
GOWN STRL REUS W/TWL LRG LVL3 (GOWN DISPOSABLE) ×4
LENS IOL ACRSF IQ PC 19.5 (Intraocular Lens) ×1 IMPLANT
LENS IOL ACRYSOF IQ POST 19.5 (Intraocular Lens) ×3 IMPLANT
NEEDLE FILTER BLUNT 18X 1/2SAF (NEEDLE) ×2
NEEDLE FILTER BLUNT 18X1 1/2 (NEEDLE) ×1 IMPLANT
PACK CATARACT (MISCELLANEOUS) ×3 IMPLANT
PACK CATARACT BRASINGTON LX (MISCELLANEOUS) ×3 IMPLANT
PACK EYE AFTER SURG (MISCELLANEOUS) ×3 IMPLANT
SOL BSS BAG (MISCELLANEOUS) ×3
SOL PREP PVP 2OZ (MISCELLANEOUS) ×3
SOLUTION BSS BAG (MISCELLANEOUS) ×1 IMPLANT
SOLUTION PREP PVP 2OZ (MISCELLANEOUS) ×1 IMPLANT
SYR 3ML LL SCALE MARK (SYRINGE) ×6 IMPLANT
SYR TB 1ML 27GX1/2 LL (SYRINGE) ×3 IMPLANT
WATER STERILE IRR 1000ML POUR (IV SOLUTION) ×3 IMPLANT
WIPE NON LINTING 3.25X3.25 (MISCELLANEOUS) ×3 IMPLANT

## 2015-06-06 NOTE — Discharge Instructions (Signed)
POST OPERATIVE INSTRUCTIONS  °DAY OF CATARACT SURGERY °Your surgery went well.  °Today, take it easy and protect the eye.  °Don’t bend over at the waist. °Don’t lift objects heavier than a jug of milk (10lbs). °Don’t let anything get in the eye other than the drops we give you. °Keep the eye shield on at all times except to put in the drops. °You may have a mild headache, soreness or scratchy sensation after surgery. °EYE DROPS AFTER SURGERY ° °        Durezol °Steroid (SHAKE WELL) Ilevro °Anti-inflammatory Vigamox °Antibiotic °  °    2 times a day Once daily 4 times a day  °   ° Wait 5 minutes between drops  °If you have questions, call [facility] °We will see you tomorrow in the eye clinic. ° °

## 2015-06-06 NOTE — Op Note (Signed)
06/06/2015  PRE-OP DIAGNOSIS: Cataract (ICD-10 H25.12) Nuclear sclerotic cataract, LEFT EYE  Post operative diagnosis: Cataract (ICD-10 H25.12) Nuclear sclerotic cataract, LEFT EYE  Procedure: Phacoemulsification with introcular lens [id number])   SURGEON: Surgeon(s) and Role:    * Lyla Glassing, MD - Primary  ANESTHESIA: Topical   ESTIMATED BLOOD LOSS: MINIMAL  COMPLICATIONS: None  OPERATIVE DESCRIPTION:  Therapeutic options were discussed with the patient preoperatively, including a discussion of risks and benefits of surgery.  Informed consent was obtained. A dilated fundus exam was performed within 6 months.   The patient was premedicated and brought to the operating room and placed on the operating table in the supine position.  Topical tetracaine was instilled.  After adequate anesthesia, the patient was prepped and draped in the usual fashion.  A wire lid speculum was inserted and the microscope was positioned.  A sideport was used to create a paracentesis site and a mixture of preservative-free lidocaine, BSS, and epinephrine was was instilled into the anterior chamber, followed by viscoelastic.  A clear corneal incision was created using a keratome blade.  Capsulorrhexis was then performed.  In situ phacoemulsification was performed.  Cortical material was removed with the irrigation-aspiration unit.  Viscoelastic was instilled to open the capsular bag.  A posterior chamber intraocular lens, model 19.5 diopters, was inserted and positioned.  Irrigation-aspiration was used to remove all viscoelastic. Intracameral cefuroxime was injected into the eye. Wounds were checked for leakage and confirmed to be secure.  Lid speculum was removed and a shield was placed over the eye.  Patient was returned to the recovery room in stable condition.  IMPLANTS:   Implant Name Type Inv. Item Serial No. Manufacturer Lot No. LRB No. Used  IMPLANT LENS - [id number] Intraocular Lens IMPLANT LENS  [id number] ALCON   Left 1     Postoperative care and discharge medication counseling was discussed with the patient or the parents prior to discharge

## 2015-06-06 NOTE — Anesthesia Postprocedure Evaluation (Signed)
  Anesthesia Post-op Note  Patient: Justin [name]  Procedure(s) Performed: Procedure(s) with comments: CATARACT EXTRACTION PHACO AND INTRAOCULAR LENS PLACEMENT (IOC) (Left) - [place]: 01:59.3 AP%: 13.7 CDE: 16.45 Lot # [id number]  Anesthesia type:MAC  Patient location: PACU  Post pain: Pain level controlled  Post assessment: Post-op Vital signs reviewed, Patient's Cardiovascular Status Stable, Respiratory Function Stable, Patent Airway and No signs of Nausea or vomiting  Post vital signs: Reviewed and stable  Last Vitals:  Filed Vitals:   06/06/15 1102  BP: 136/77  Pulse: 56  Temp: 36.7 C  Resp: 16    Level of consciousness: awake, alert  and patient cooperative  Complications: No apparent anesthesia complications

## 2015-06-06 NOTE — Anesthesia Procedure Notes (Signed)
Procedure Name: MAC Performed by: BEANE, CATHERINE Pre-anesthesia Checklist: Patient identified, Emergency Drugs available, Suction available, Patient being monitored and Timeout performed Oxygen Delivery Method: Nasal cannula       

## 2015-06-06 NOTE — Transfer of Care (Signed)
Immediate Anesthesia Transfer of Care Note  Patient: Justin [name]  Procedure(s) Performed: Procedure(s) with comments: CATARACT EXTRACTION PHACO AND INTRAOCULAR LENS PLACEMENT (IOC) (Left) - [place]: 01:59.3 AP%: 13.7 CDE: 16.45 Lot # [id number]  Patient Location: PACU  Anesthesia Type:MAC  Level of Consciousness: awake, alert  and oriented  Airway & Oxygen Therapy: Patient Spontanous Breathing  Post-op Assessment: Report given to RN and Post -op Vital signs reviewed and stable  Post vital signs: Reviewed and stable  Last Vitals:  Filed Vitals:   06/06/15 0822  BP: 134/68  Pulse: 65  Temp: 37 C  Resp: 18    Complications: No anesthesia complications

## 2015-06-06 NOTE — H&P (Signed)
The history and physical was faxed to the hospital. The history and physical was reviewed by me and no changes have occurred.   

## 2015-06-06 NOTE — Anesthesia Preprocedure Evaluation (Signed)
Anesthesia Evaluation  Patient identified by MRN, date of birth, ID band Patient awake    Reviewed: Allergy & Precautions, H&P , NPO status , Patient's Chart, lab work & pertinent test results  Airway Mallampati: III  TM Distance: >3 FB Neck ROM: limited    Dental  (+) Poor Dentition, Chipped   Pulmonary neg shortness of breath, sleep apnea , former smoker,    Pulmonary exam normal breath sounds clear to auscultation       Cardiovascular Exercise Tolerance: Good hypertension, (-) Past MI Normal cardiovascular exam Rhythm:regular Rate:Normal     Neuro/Psych negative neurological ROS  negative psych ROS   GI/Hepatic Neg liver ROS, neg GERD  ,  Endo/Other  negative endocrine ROS  Renal/GU Renal disease  negative genitourinary   Musculoskeletal  (+) Arthritis ,   Abdominal   Peds  Hematology negative hematology ROS (+)   Anesthesia Other Findings Past Medical History:   BPH (benign prostatic hyperplasia)                           Palsy                                                          Comment:chronic facial nerve   Hypertension                                                 Bell's palsy                                                 Cancer                                                         Comment:bladder   Urinary frequency                                            Urinary tract infection                                      History of urinary urgency                                   Kidney stone                                                 Hydronephrosis, bilateral  Deaf                                                           Comment:right ear   Neuroma                                                        Comment:acoustic   Dizziness                                                    Arthritis                                                    Reproductive/Obstetrics negative OB ROS                             Anesthesia Physical Anesthesia Plan  ASA: III  Anesthesia Plan: MAC   Post-op Pain Management:    Induction:   Airway Management Planned:   Additional Equipment:   Intra-op Plan:   Post-operative Plan:   Informed Consent: I have reviewed the patients History and Physical, chart, labs and discussed the procedure including the risks, benefits and alternatives for the proposed anesthesia with the patient or authorized representative who has indicated his/her understanding and acceptance.   Dental Advisory Given  Plan Discussed with: Anesthesiologist, CRNA and Surgeon  Anesthesia Plan Comments:         Anesthesia Quick Evaluation

## 2015-06-28 ENCOUNTER — Ambulatory Visit (INDEPENDENT_AMBULATORY_CARE_PROVIDER_SITE_OTHER): Payer: Medicare Other | Admitting: Urology

## 2015-06-28 ENCOUNTER — Encounter: Payer: Self-pay | Admitting: Urology

## 2015-06-28 VITALS — BP 123/70 | HR 81 | Ht 70.0 in | Wt 201.1 lb

## 2015-06-28 DIAGNOSIS — Z8603 Personal history of neoplasm of uncertain behavior: Secondary | ICD-10-CM

## 2015-06-28 DIAGNOSIS — Z87448 Personal history of other diseases of urinary system: Secondary | ICD-10-CM

## 2015-06-28 DIAGNOSIS — N400 Enlarged prostate without lower urinary tract symptoms: Secondary | ICD-10-CM

## 2015-06-28 LAB — MICROSCOPIC EXAMINATION
EPITHELIAL CELLS (NON RENAL): NONE SEEN /HPF (ref 0–10)
RENAL EPITHEL UA: NONE SEEN /HPF

## 2015-06-28 LAB — URINALYSIS, COMPLETE
BILIRUBIN UA: NEGATIVE
Glucose, UA: NEGATIVE
Ketones, UA: NEGATIVE
Nitrite, UA: NEGATIVE
PH UA: 5 (ref 5.0–7.5)
Protein, UA: NEGATIVE
Specific Gravity, UA: 1.02 (ref 1.005–1.030)
UUROB: 0.2 mg/dL (ref 0.2–1.0)

## 2015-06-28 MED ORDER — LIDOCAINE HCL 2 % EX GEL
1.0000 "application " | Freq: Once | CUTANEOUS | Status: AC
  Administered 2015-06-28: 1 via URETHRAL

## 2015-06-28 MED ORDER — CIPROFLOXACIN HCL 500 MG PO TABS
500.0000 mg | ORAL_TABLET | Freq: Once | ORAL | Status: AC
  Administered 2015-06-28: 500 mg via ORAL

## 2015-06-28 NOTE — Progress Notes (Signed)
Patient here for routine follow-up EC the bladder was high-grade. Previous notes outlining his history of his TCC in good detail. Today I see no overt tumors. There is one area of redness on the left portion of the trigone which patient states is been there or ever. His previous treatments have been in the [facility] area. He is being followed by Dr. Erlene Quan who will see him again in 3 months for his next cystoscopy. He has no complaints in regards to his outlet of obstruction which is being treated with tamsulosin finasteride. His prostate showed mostly lateral lobe hypertrophy on cystoscopy without a lot of lower lobe hypertrophy is a good candidate for tamsulosin and finasteride treatment and is asymptomatic of same. Bladder had slight trabeculation. Areas or resection on the lateral wall were seen is also is status, history of renal calculi bilaterally but nonobstructive next cystoscopy is in 3 months.

## 2015-06-28 NOTE — Progress Notes (Signed)
    Cystoscopy Procedure Note  Patient identification was confirmed, informed consent was obtained, and patient was prepped using Betadine solution.  Lidocaine jelly was administered per urethral meatus.    Preoperative abx where received prior to procedure.     Pre-Procedure: - Inspection reveals a normal caliber ureteral meatus.  Procedure: The flexible cystoscope was introduced without difficulty - No urethral strictures/lesions are present. -  prostate mostly lateral lobe enlargement -  bladder neck - Bilateral ureteral orifices identified - Bladder mucosa  reveals no ulcers, tumors, or lesions - No bladder stones - No trabeculation -  Small area of erythema on the left trigone. Had these areas of erythema and they have never indicated C IS but should be looked at again in 3 months. Retroflexion shows no tumors masses or growths.   Post-Procedure: - Patient tolerated the procedure well

## 2015-07-11 DIAGNOSIS — G510 Bell's palsy: Secondary | ICD-10-CM | POA: Diagnosis not present

## 2015-07-15 DIAGNOSIS — Z86018 Personal history of other benign neoplasm: Secondary | ICD-10-CM | POA: Diagnosis not present

## 2015-07-15 DIAGNOSIS — I10 Essential (primary) hypertension: Secondary | ICD-10-CM | POA: Insufficient documentation

## 2015-07-15 DIAGNOSIS — Z8551 Personal history of malignant neoplasm of bladder: Secondary | ICD-10-CM | POA: Diagnosis not present

## 2015-07-15 DIAGNOSIS — N400 Enlarged prostate without lower urinary tract symptoms: Secondary | ICD-10-CM | POA: Diagnosis not present

## 2015-07-15 DIAGNOSIS — Z01818 Encounter for other preprocedural examination: Secondary | ICD-10-CM | POA: Diagnosis not present

## 2015-07-16 DIAGNOSIS — R0683 Snoring: Secondary | ICD-10-CM | POA: Diagnosis not present

## 2015-07-16 DIAGNOSIS — Z87891 Personal history of nicotine dependence: Secondary | ICD-10-CM | POA: Diagnosis not present

## 2015-07-16 DIAGNOSIS — I10 Essential (primary) hypertension: Secondary | ICD-10-CM | POA: Diagnosis not present

## 2015-07-16 DIAGNOSIS — Z885 Allergy status to narcotic agent status: Secondary | ICD-10-CM | POA: Diagnosis not present

## 2015-07-16 DIAGNOSIS — H02201 Unspecified lagophthalmos right upper eyelid: Secondary | ICD-10-CM | POA: Diagnosis not present

## 2015-07-16 DIAGNOSIS — G510 Bell's palsy: Secondary | ICD-10-CM | POA: Diagnosis not present

## 2015-07-16 DIAGNOSIS — N400 Enlarged prostate without lower urinary tract symptoms: Secondary | ICD-10-CM | POA: Diagnosis not present

## 2015-09-03 ENCOUNTER — Encounter: Payer: Self-pay | Admitting: Podiatry

## 2015-09-03 ENCOUNTER — Ambulatory Visit (INDEPENDENT_AMBULATORY_CARE_PROVIDER_SITE_OTHER): Payer: Medicare Other | Admitting: Podiatry

## 2015-09-03 DIAGNOSIS — B351 Tinea unguium: Secondary | ICD-10-CM | POA: Diagnosis not present

## 2015-09-03 DIAGNOSIS — M79676 Pain in unspecified toe(s): Secondary | ICD-10-CM

## 2015-09-03 NOTE — Progress Notes (Signed)
Patient ID: Justin [name], male   DOB: 01/26/1941, 74 y.o.   MRN: [id number]  Subjective: 74 y.o. returns the office today for painful, elongated, thickened toenails which they cannot trim themself. Denies any redness or drainage around the nails. Denies any acute changes since last appointment and no new complaints today. Denies any systemic complaints such as fevers, chills, nausea, vomiting.   Objective: AAO 3, NAD DP/PT pulses palpable, CRT less than 3 seconds Protective sensation intact with Simms Weinstein monofilament, Achilles tendon reflex intact.  Nails hypertrophic, dystrophic, elongated, brittle, discolored 10. There is tenderness overlying the nails 1-5 bilaterally. There is no surrounding erythema or drainage along the nail sites. No open lesions or pre-ulcerative lesions are identified. No other areas of tenderness bilateral lower extremities. No overlying edema, erythema, increased warmth. No pain with calf compression, swelling, warmth, erythema.  Assessment: Patient presents with symptomatic onychomycosis  Plan: -Treatment options including alternatives, risks, complications were discussed -Nails sharply debrided 10 without complication/bleeding. -Discussed daily foot inspection. If there are any changes, to call the office immediately.  -Follow-up in 3 months or sooner if any problems are to arise. In the meantime, encouraged to call the office with any questions, concerns, changes symptoms.  Celesta Gentile, DPM

## 2015-09-30 ENCOUNTER — Ambulatory Visit (INDEPENDENT_AMBULATORY_CARE_PROVIDER_SITE_OTHER): Payer: Medicare Other | Admitting: Urology

## 2015-09-30 ENCOUNTER — Encounter: Payer: Self-pay | Admitting: Urology

## 2015-09-30 VITALS — BP 144/80 | HR 90 | Ht 71.0 in | Wt 204.2 lb

## 2015-09-30 DIAGNOSIS — R319 Hematuria, unspecified: Secondary | ICD-10-CM | POA: Diagnosis not present

## 2015-09-30 DIAGNOSIS — N400 Enlarged prostate without lower urinary tract symptoms: Secondary | ICD-10-CM

## 2015-09-30 DIAGNOSIS — Z8603 Personal history of neoplasm of uncertain behavior: Secondary | ICD-10-CM

## 2015-09-30 DIAGNOSIS — R8299 Other abnormal findings in urine: Secondary | ICD-10-CM | POA: Diagnosis not present

## 2015-09-30 DIAGNOSIS — N1339 Other hydronephrosis: Secondary | ICD-10-CM

## 2015-09-30 DIAGNOSIS — N200 Calculus of kidney: Secondary | ICD-10-CM

## 2015-09-30 DIAGNOSIS — Z87448 Personal history of other diseases of urinary system: Secondary | ICD-10-CM

## 2015-09-30 LAB — MICROSCOPIC EXAMINATION: WBC, UA: >30 /hpf — ABNORMAL HIGH (ref 0–?)

## 2015-09-30 LAB — URINALYSIS, COMPLETE
BILIRUBIN UA: NEGATIVE
Glucose, UA: NEGATIVE
KETONES UA: NEGATIVE
Nitrite, UA: NEGATIVE
PROTEIN UA: NEGATIVE
SPEC GRAV UA: 1.02 (ref 1.005–1.030)
UUROB: 0.2 mg/dL (ref 0.2–1.0)
pH, UA: 5 (ref 5.0–7.5)

## 2015-09-30 MED ORDER — LIDOCAINE HCL 2 % EX GEL
1.0000 "application " | Freq: Once | CUTANEOUS | Status: AC
  Administered 2015-09-30: 1 via URETHRAL

## 2015-09-30 MED ORDER — CIPROFLOXACIN HCL 500 MG PO TABS
500.0000 mg | ORAL_TABLET | Freq: Once | ORAL | Status: AC
  Administered 2015-09-30: 500 mg via ORAL

## 2015-09-30 NOTE — Progress Notes (Signed)
4:09 PM  09/30/2015   Justin [name] 1940-10-28 [id number]  Referring provider: [name], PA-C [address] [place], [place] [address]  Chief Complaint  Patient presents with  . 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  He returns today for routine surveillance cystoscopy.   He was formally a patient of Dr. Phil Dopp in [facility]). He underwent TURBT in 07/2011 for a tumor on the right lateral wall which revealed HG T1. Records do not indicate repeat TUR or BCG. He had a recurrence of LG Ta in 03/2013 identified on quarterly surveillance cystoscopy above the right UO.   He was also managed by Dr. Harrison Mons upon moving to [facility]. Work up included IVP and RUS showing mild right hydroureteronephrosis and possible bilateral non obstructing stones. He also underwent cystoscopy in 01/2014 which showederythema on the right trigone and UO was difficult to identify. Urine cytology from that time was atypical. He elected to follow this lesion and cystoscopy on 05/04/14 showed progression of this lesions noting a 2x2 area in the midline of the bladder extending off to the right trigone with surrounding neovascularity concerning for CIS.   He was taken by myself to OR on 06/13/14 for left retrograde, bladder biopsy, and instillation of mitomycin. There was significant erythema around the right UO and trigone which was biopsied extensively. The right UO was identified but noted to have a small mucosal flap overlying consistent with previous resection of the UO and was unable to be cannulated for right RTG. Efflux of urine was seen from this ureter consent with patency. Pathology showed chronic inflammation but NO evidence of CIS. Urine cytology negative x 2. Follow up CT Urogram on 09/26/14 showed bilateral lower pole nonobstructing stones measuring 2 mm in the left and 3 on the right.  He was also noted to  have very mild bilateral hydroureteronephrsois, right greater than left.    He continues to have right trigonal patchy of erythema (previously biopsied, negative) which was remained stable on serial cystoscopy.    He does have history of smoking, 2-3 ppd x 4-5 years, quit 40 years ago.   He also has a history of BPH on longstanding terazosin and finasteride. Symptoms stable.   No recent gross hematuria or dysuria.   No flank pain.   PMH: Past Medical History  Diagnosis Date  . BPH (benign prostatic hyperplasia)   . Palsy ([place])     chronic facial nerve  . Hypertension   . Bell's palsy   . Cancer ([place])     bladder  . Urinary frequency   . Urinary tract infection   . History of urinary urgency   . Kidney stone   . Hydronephrosis, bilateral   . Deaf     right ear  . Neuroma     acoustic  . Dizziness   . Arthritis     Surgical History: Past Surgical History  Procedure Laterality Date  . Gamma knife      for acoustic neuroma  . Back surgery      cervical fusion  . Cervial fusion    . Carpal tunnel release Bilateral 01/25/2015    Procedure: CARPAL TUNNEL RELEASE;  Surgeon: Christophe Louis, MD;  Location: [facility] ORS;  Service: Orthopedics;  Laterality: Bilateral;  . Plate in head    . Brain tumor excision    . Transurethral resection of bladder tumor with gyrus (  turbt-gyrus)    . Eye surgery    . Cataract extraction w/phaco Left 06/06/2015    Procedure: CATARACT EXTRACTION PHACO AND INTRAOCULAR LENS PLACEMENT (IOC);  Surgeon: Lyla Glassing, MD;  Location: [facility] ORS;  Service: Ophthalmology;  Laterality: Left;  [place]: 01:59.3 AP%: 13.7 CDE: 16.45 Lot # [id number]    Home Medications:    Medication List       This list is accurate as of: 09/30/15  4:09 PM.  Always use your most recent med list.               DUREZOL 0.05 % Emul  Generic drug:  Difluprednate     finasteride 5 MG tablet  Commonly known as:  PROSCAR  Take 5 mg by mouth daily. pm     ILEVRO 0.3  % ophthalmic suspension  Generic drug:  nepafenac     lisinopril 20 MG tablet  Commonly known as:  PRINIVIL,ZESTRIL  Take 20 mg by mouth daily. pm     terazosin 5 MG capsule  Commonly known as:  HYTRIN  Take 2 mg by mouth at bedtime. pm     TH CRANBERRY CONCENTRATE PO  Take by mouth.        Allergies:  Allergies  Allergen Reactions  . Percocet [Oxycodone-Acetaminophen] Rash    Family History: Family History  Problem Relation Age of Onset  . Cancer Mother     Social History:  reports that he quit smoking about 36 years ago. He has never used smokeless tobacco. He reports that he drinks alcohol. He reports that he does not use illicit drugs.   Physical Exam: BP 144/80 mmHg  Pulse 90  Ht 5\' 11"  (1.803 m)  Wt 204 lb 3.2 oz (92.625 kg)  BMI 28.49 kg/m2  Constitutional:  Alert and oriented, No acute distress. HEENT: [place] AT, moist mucus membranes.  Trachea midline, no masses. Cardiovascular: No clubbing, cyanosis, or edema. Respiratory: Normal respiratory effort, no increased work of breathing. GI: Abdomen is soft, nontender, nondistended, no abdominal masses GU: No CVA tenderness. Normal phallus, orthotopic meatus. Skin: No rashes, bruises or suspicious lesions. Neurologic: Grossly intact, no focal deficits, moving all 4 extremities. Psychiatric: Normal mood and affect.  Laboratory Data: Lab Results  Component Value Date   WBC 8.7 06/24/2014   HGB 13.7 06/24/2014   HCT 42.5 06/24/2014   MCV 96 06/24/2014   PLT 164 06/24/2014    Lab Results  Component Value Date   CREATININE 1.34* 06/24/2014    Urinalysis UA with   Pertinent Imaging: As above, CT Urogram 09/26/14  Cystoscopy Procedure Note  Patient identification was confirmed, informed consent was obtained, and patient was prepped using Betadine solution.  Lidocaine jelly was administered per urethral meatus.    Preoperative abx where received prior to procedure.     Pre-Procedure: - Inspection  reveals a normal caliber ureteral meatus.  Procedure: The flexible cystoscope was introduced without difficulty - No urethral strictures/lesions are present. - Enlarged prostate  - Normal bladder neck - Bilateral ureteral orifices identified - Bladder mucosa  reveals persistent chronic patchy erythema on the posterior wall bladder extending towards right trigone. There is a mucosal flap over the right UO was noted at the time of previous biopsy. The lesions appear to be suspicious for CIS as previously noted, stable from previous cystoscopy s/p negative biopsy/ urine cytology.   No papillary tumors. - No bladder stones - No trabeculation  Retroflexion in remarkable.   Post-Procedure: - Patient tolerated the procedure  well   Assessment & Plan:    1. History of bladder cancer Cystoscopy today with stable right trigonal erythema s/p negative previous biopsy/ urine cytologies.  Will  repeat urine cytology. Given appearance of bladder, would prefer to repeat cystoscopy in 4 months rather than 6 months. -f/u urine cytology- Will call with cytology results if suspicious or positive. - Urinalysis, Complete - ciprofloxacin (CIPRO) tablet 500 mg; Take 1 tablet (500 mg total) by mouth once. - lidocaine (XYLOCAINE) 2 % jelly 1 application; Place 1 application into the urethra once.  2. BPH (benign prostatic hyperplasia) Stable symptoms on finasteride in terazosin. Continue these medications.  3. Kidney stones Icidental small bilateral lower pole stones. Asymptomatic. No history of previous nephrolithiasis. We'll continue to follow.  4. 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  No filling defects. Suspect this is related to previous resection of the UOs on previous TURBT.  -ordered f/u RUS today given 1 year since last imaging to assess for stability -BUN/Cr today  Return in about 4 months (around  01/28/2016) for cystoscopy.   Will call with RUS results.  Hollice Espy, MD  [facility] [address] [place], [place] [address] [phone]

## 2015-10-01 ENCOUNTER — Telehealth: Payer: Self-pay

## 2015-10-01 ENCOUNTER — Other Ambulatory Visit: Payer: Self-pay | Admitting: Urology

## 2015-10-01 LAB — BUN+CREAT
BUN / CREAT RATIO: 21 (ref 10–22)
BUN: 24 mg/dL (ref 8–27)
Creatinine, Ser: 1.16 mg/dL (ref 0.76–1.27)
GFR calc Af Amer: 71 mL/min/{1.73_m2} (ref >59)
GFR calc non Af Amer: 62 mL/min/{1.73_m2} (ref >59)

## 2015-10-01 NOTE — Telephone Encounter (Signed)
Spoke with pt in reference to renal function. Pt voiced happiness and understanding.

## 2015-10-01 NOTE — Telephone Encounter (Signed)
-----   Message from Hollice Espy, MD sent at 10/01/2015  7:25 AM EST ----- Renal function stable.  Please make patient aware.    Hollice Espy, MD

## 2015-10-08 ENCOUNTER — Encounter: Payer: Self-pay | Admitting: Urology

## 2015-10-14 ENCOUNTER — Ambulatory Visit
Admission: RE | Admit: 2015-10-14 | Discharge: 2015-10-14 | Disposition: A | Discharge status: Home / Self Care | Payer: Medicare Other | Source: Ambulatory Visit | Attending: Urology | Admitting: Urology

## 2015-10-14 DIAGNOSIS — N281 Cyst of kidney, acquired: Secondary | ICD-10-CM | POA: Diagnosis not present

## 2015-10-14 DIAGNOSIS — Z8551 Personal history of malignant neoplasm of bladder: Secondary | ICD-10-CM | POA: Diagnosis not present

## 2015-10-14 DIAGNOSIS — N1339 Other hydronephrosis: Secondary | ICD-10-CM | POA: Insufficient documentation

## 2015-10-14 DIAGNOSIS — N1330 Unspecified hydronephrosis: Secondary | ICD-10-CM | POA: Diagnosis not present

## 2015-10-21 ENCOUNTER — Telehealth: Payer: Self-pay

## 2015-10-21 NOTE — Telephone Encounter (Signed)
-----   Message from Hollice Espy, MD sent at 10/20/2015 12:01 PM EST ----- Please let this patient noticed that his renal ultrasound looks fantastic. He he has no hydronephrosis or swelling of either kidney which is great. We will see him back as scheduled. Of note, his ultrasound did show that he was not emptying his bladder all the way so please instruct him to double void as needed.  Hollice Espy, MD

## 2015-10-21 NOTE — Telephone Encounter (Signed)
Spoke with pt in reference renal u/s results and f/u. Pt voiced understanding.

## 2015-10-28 ENCOUNTER — Other Ambulatory Visit: Payer: Self-pay

## 2015-10-28 DIAGNOSIS — N400 Enlarged prostate without lower urinary tract symptoms: Secondary | ICD-10-CM

## 2015-10-28 MED ORDER — FINASTERIDE 5 MG PO TABS: 5.0000 mg | ORAL_TABLET | Freq: Every day | ORAL | Status: AC

## 2015-11-18 DIAGNOSIS — Z6832 Body mass index (BMI) 32.0-32.9, adult: Secondary | ICD-10-CM | POA: Diagnosis not present

## 2015-11-18 DIAGNOSIS — E669 Obesity, unspecified: Secondary | ICD-10-CM | POA: Diagnosis not present

## 2015-11-18 DIAGNOSIS — Z0000 Encounter for general adult medical examination without abnormal findings: Secondary | ICD-10-CM | POA: Diagnosis not present

## 2015-11-18 DIAGNOSIS — D333 Benign neoplasm of cranial nerves: Secondary | ICD-10-CM | POA: Diagnosis not present

## 2015-11-18 DIAGNOSIS — Z23 Encounter for immunization: Secondary | ICD-10-CM | POA: Diagnosis not present

## 2015-11-18 DIAGNOSIS — C679 Malignant neoplasm of bladder, unspecified: Secondary | ICD-10-CM | POA: Diagnosis not present

## 2015-11-18 DIAGNOSIS — I10 Essential (primary) hypertension: Secondary | ICD-10-CM | POA: Diagnosis not present

## 2015-11-18 DIAGNOSIS — N400 Enlarged prostate without lower urinary tract symptoms: Secondary | ICD-10-CM | POA: Diagnosis not present

## 2015-12-03 ENCOUNTER — Ambulatory Visit: Payer: Medicare Other | Admitting: Podiatry

## 2015-12-12 ENCOUNTER — Ambulatory Visit (INDEPENDENT_AMBULATORY_CARE_PROVIDER_SITE_OTHER): Payer: Medicare Other | Admitting: Podiatry

## 2015-12-12 ENCOUNTER — Encounter: Payer: Self-pay | Admitting: Podiatry

## 2015-12-12 DIAGNOSIS — B351 Tinea unguium: Secondary | ICD-10-CM | POA: Diagnosis not present

## 2015-12-12 DIAGNOSIS — M79676 Pain in unspecified toe(s): Secondary | ICD-10-CM

## 2015-12-12 NOTE — Progress Notes (Signed)
Patient ID: Justin [name], male   DOB: 1941/04/19, 75 y.o.   MRN: [id number]  Subjective: 75 y.o. returns the office today for painful, elongated, thickened toenails which they cannot trim themself. Denies any redness or drainage around the nails. He states that he has had multiple treatments for toenail fungus over 65 years and not had any relief. Denies any acute changes since last appointment and no new complaints today. Denies any systemic complaints such as fevers, chills, nausea, vomiting.   Objective: AAO 3, NAD DP/PT pulses palpable, CRT less than 3 seconds Protective sensation intact with Simms Weinstein monofilament, Achilles tendon reflex intact.  Nails hypertrophic, dystrophic, elongated, brittle, discolored 10. There is tenderness overlying the nails 1-5 bilaterally. There is no surrounding erythema or drainage along the nail sites. No open lesions or pre-ulcerative lesions are identified. No other areas of tenderness bilateral lower extremities. No overlying edema, erythema, increased warmth. No pain with calf compression, swelling, warmth, erythema.  Assessment: Patient presents with symptomatic onychomycosis  Plan: -Treatment options including alternatives, risks, complications were discussed -Nails sharply debrided 10 without complication/bleeding. -Discussed multiple treatment options for nail fungus. He will restart topical OTC.  -Discussed daily foot inspection. If there are any changes, to call the office immediately.  -Follow-up in 3 months or sooner if any problems are to arise. In the meantime, encouraged to call the office with any questions, concerns, changes symptoms.  Celesta Gentile, DPM

## 2016-01-30 ENCOUNTER — Other Ambulatory Visit: Payer: Self-pay | Admitting: Neurosurgery

## 2016-01-30 ENCOUNTER — Other Ambulatory Visit (HOSPITAL_COMMUNITY): Payer: Self-pay | Admitting: Neurosurgery

## 2016-01-30 DIAGNOSIS — D333 Benign neoplasm of cranial nerves: Secondary | ICD-10-CM

## 2016-01-31 ENCOUNTER — Ambulatory Visit (INDEPENDENT_AMBULATORY_CARE_PROVIDER_SITE_OTHER): Payer: Medicare Other | Admitting: Urology

## 2016-01-31 ENCOUNTER — Encounter: Payer: Self-pay | Admitting: Urology

## 2016-01-31 VITALS — BP 147/82 | HR 73 | Ht 70.0 in | Wt 199.0 lb

## 2016-01-31 DIAGNOSIS — N390 Urinary tract infection, site not specified: Secondary | ICD-10-CM | POA: Diagnosis not present

## 2016-01-31 DIAGNOSIS — N3090 Cystitis, unspecified without hematuria: Secondary | ICD-10-CM | POA: Diagnosis not present

## 2016-01-31 DIAGNOSIS — N400 Enlarged prostate without lower urinary tract symptoms: Secondary | ICD-10-CM | POA: Diagnosis not present

## 2016-01-31 DIAGNOSIS — Z87448 Personal history of other diseases of urinary system: Secondary | ICD-10-CM

## 2016-01-31 DIAGNOSIS — R399 Unspecified symptoms and signs involving the genitourinary system: Secondary | ICD-10-CM | POA: Diagnosis not present

## 2016-01-31 DIAGNOSIS — Z8603 Personal history of neoplasm of uncertain behavior: Secondary | ICD-10-CM

## 2016-01-31 LAB — URINALYSIS, COMPLETE
Bilirubin, UA: NEGATIVE
Glucose, UA: NEGATIVE
Ketones, UA: NEGATIVE
Nitrite, UA: POSITIVE — AB
PH UA: 7 (ref 5.0–7.5)
PROTEIN UA: NEGATIVE
Specific Gravity, UA: 1.02 (ref 1.005–1.030)
UUROB: 0.2 mg/dL (ref 0.2–1.0)

## 2016-01-31 LAB — MICROSCOPIC EXAMINATION: RBC, UA: >30 /hpf — ABNORMAL HIGH (ref 0–?)

## 2016-01-31 MED ORDER — CIPROFLOXACIN HCL 500 MG PO TABS
500.0000 mg | ORAL_TABLET | Freq: Once | ORAL | Status: AC
  Administered 2016-01-31: 500 mg via ORAL

## 2016-01-31 MED ORDER — LIDOCAINE HCL 2 % EX GEL: 1.0000 "application " | Freq: Once | CUTANEOUS | Status: DC

## 2016-01-31 NOTE — Progress Notes (Signed)
01/31/2016  Cc: history of bladder cancer  HPI: 75 yo M with history of bladder cancer in 07/2011, HgT1 and recurrent with LgTa 03/2013. He returns today for routine surveillance cystoscopy.   UA today is highly suspicious for infection today with >30 WBC, >RBC, many bacteria, nitrite positive.    He is otherwise asymptomatic.  No gross hematuria, dysuria, fevers, or confusion.    Exam:  Blood pressure 147/82, pulse 73, height 5\' 10"  (1.778 m), weight 199 lb (90.266 kg). NAD.  A&O. Wife present today. No suprapubic tenderness.  UA See results in epic  A/P:   1. History of neoplasm of bladder Defer cysto today due to active UTI UCx- currently asymptomatic, will defer treatment until UCx results  Advised to call if becomes symptomatic Reschedule cysto in 2 weeks - Urinalysis, Complete - ciprofloxacin (CIPRO) tablet 500 mg; Take 1 tablet (500 mg total) by mouth once. - CULTURE, URINE COMPREHENSIVE  2. Cystitis As above

## 2016-02-04 ENCOUNTER — Telehealth: Payer: Self-pay | Admitting: Urology

## 2016-02-04 NOTE — Telephone Encounter (Signed)
Patient called the office to get urine culture results from Friday.  I advised him that we only had the preliminary results and would call him when the final results were complete.

## 2016-02-06 ENCOUNTER — Telehealth: Payer: Self-pay

## 2016-02-06 DIAGNOSIS — N390 Urinary tract infection, site not specified: Secondary | ICD-10-CM

## 2016-02-06 LAB — CULTURE, URINE COMPREHENSIVE

## 2016-02-06 MED ORDER — SULFAMETHOXAZOLE-TRIMETHOPRIM 800-160 MG PO TABS: 1.0000 | ORAL_TABLET | Freq: Two times a day (BID) | ORAL | Status: AC

## 2016-02-06 NOTE — Telephone Encounter (Signed)
Spoke with pt in reference to +ucx. Made aware abx have been sent to pharmacy. Also made pt aware will need to reschedule cysto. Pt voiced understanding and was transferred to the front to r/s appt.

## 2016-02-06 NOTE — Telephone Encounter (Signed)
-----   Message from Hollice Espy, MD sent at 02/05/2016  5:12 PM EDT ----- Please treat with bactrim ds bid x 7 days prior to rescheduled cystoscopy  Hollice Espy, MD

## 2016-02-11 ENCOUNTER — Ambulatory Visit
Admission: RE | Admit: 2016-02-11 | Discharge: 2016-02-11 | Disposition: A | Discharge status: Home / Self Care | Payer: Medicare Other | Source: Ambulatory Visit | Attending: Neurosurgery | Admitting: Neurosurgery

## 2016-02-11 ENCOUNTER — Telehealth: Payer: Self-pay

## 2016-02-11 DIAGNOSIS — Z01812 Encounter for preprocedural laboratory examination: Secondary | ICD-10-CM | POA: Insufficient documentation

## 2016-02-11 DIAGNOSIS — D333 Benign neoplasm of cranial nerves: Secondary | ICD-10-CM | POA: Diagnosis not present

## 2016-02-11 LAB — POCT I-STAT CREATININE: CREATININE: 1.7 mg/dL — AB (ref 0.61–1.24)

## 2016-02-11 MED ORDER — GADOBENATE DIMEGLUMINE 529 MG/ML IV SOLN
10.0000 mL | Freq: Once | INTRAVENOUS | Status: AC | PRN
  Administered 2016-02-11: 9 mL via INTRAVENOUS

## 2016-02-11 NOTE — Telephone Encounter (Signed)
Pt called stating he is going to have a brain MRI today for a tumor. Pt wanted to know if the contrast would interact with current abx for UTI. Reinforced with pt the two medications should not interact with each other. Pt voiced understanding.

## 2016-02-12 ENCOUNTER — Other Ambulatory Visit: Payer: Self-pay | Admitting: Urology

## 2016-02-19 ENCOUNTER — Ambulatory Visit (INDEPENDENT_AMBULATORY_CARE_PROVIDER_SITE_OTHER): Payer: Medicare Other | Admitting: Urology

## 2016-02-19 ENCOUNTER — Encounter: Payer: Self-pay | Admitting: Urology

## 2016-02-19 VITALS — BP 130/74 | HR 73 | Ht 69.0 in | Wt 196.0 lb

## 2016-02-19 DIAGNOSIS — N200 Calculus of kidney: Secondary | ICD-10-CM | POA: Diagnosis not present

## 2016-02-19 DIAGNOSIS — N390 Urinary tract infection, site not specified: Secondary | ICD-10-CM | POA: Diagnosis not present

## 2016-02-19 DIAGNOSIS — R399 Unspecified symptoms and signs involving the genitourinary system: Secondary | ICD-10-CM | POA: Diagnosis not present

## 2016-02-19 DIAGNOSIS — N1330 Unspecified hydronephrosis: Secondary | ICD-10-CM

## 2016-02-19 DIAGNOSIS — Z87448 Personal history of other diseases of urinary system: Secondary | ICD-10-CM

## 2016-02-19 DIAGNOSIS — N400 Enlarged prostate without lower urinary tract symptoms: Secondary | ICD-10-CM

## 2016-02-19 DIAGNOSIS — Z8603 Personal history of neoplasm of uncertain behavior: Secondary | ICD-10-CM

## 2016-02-19 LAB — URINALYSIS, COMPLETE
BILIRUBIN UA: NEGATIVE
Glucose, UA: NEGATIVE
KETONES UA: NEGATIVE
Leukocytes, UA: NEGATIVE
NITRITE UA: NEGATIVE
PH UA: 5.5 (ref 5.0–7.5)
Protein, UA: NEGATIVE
SPEC GRAV UA: 1.025 (ref 1.005–1.030)
Urobilinogen, Ur: 0.2 mg/dL (ref 0.2–1.0)

## 2016-02-19 LAB — MICROSCOPIC EXAMINATION
Bacteria, UA: NONE SEEN
WBC, UA: NONE SEEN /hpf (ref 0–?)

## 2016-02-19 MED ORDER — LIDOCAINE HCL 2 % EX GEL
1.0000 "application " | Freq: Once | CUTANEOUS | Status: AC
  Administered 2016-02-19: 1 via URETHRAL

## 2016-02-19 MED ORDER — CIPROFLOXACIN HCL 500 MG PO TABS
500.0000 mg | ORAL_TABLET | Freq: Once | ORAL | Status: AC
  Administered 2016-02-19: 500 mg via ORAL

## 2016-02-19 NOTE — Progress Notes (Signed)
9:12 AM  02/19/2016   Justin [name] [birth year] [id number]  Referring provider: [name], PA-C [address] [place], [place] [address]  Chief Complaint  Patient presents with  . 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  He returns today for routine surveillance cystoscopy.   He was formally a patient of Dr. Phil Dopp in [facility]). He underwent TURBT in 07/2011 for a tumor on the right lateral wall which revealed HG T1. Records do not indicate repeat TUR or BCG. He had a recurrence of LG Ta in 03/2013 identified on quarterly surveillance cystoscopy above the right UO.   He was also managed by Dr. Harrison Mons upon moving to [facility]. Work up included IVP and RUS showing mild right hydroureteronephrosis and possible bilateral non obstructing stones. He also underwent cystoscopy in 01/2014 which showed erythema on the right trigone and UO was difficult to identify. Urine cytology from that time was atypical. He elected to follow this lesion and cystoscopy on 05/04/14 showed progression of this lesions noting a 2x2 area in the midline of the bladder extending off to the right trigone with surrounding neovascularity concerning for CIS.   He was taken by myself to OR on 06/13/14 for left retrograde, bladder biopsy, and instillation of mitomycin. There was significant erythema around the right UO and trigone which was biopsied extensively. The right UO was identified but noted to have a small mucosal flap overlying consistent with previous resection of the UO and was unable to be cannulated for right RTG. Efflux of urine was seen from this ureter consent with patency. Pathology showed chronic inflammation but NO evidence of CIS. Urine cytology negative x 2. Follow up CT Urogram on 09/26/14 showed bilateral lower pole nonobstructing stones measuring 2 mm in the left and 3 on the right.  He was also noted to have very mild  bilateral hydroureteronephrsois, right greater than left.   Most recent RUS on 2/17 unremarkable, midly elevated PVR but no overt hydro, Cr stable at 1.16.  He continues to have right trigonal patchy of erythema (previously biopsied, negative) which was remained stable on serial cystoscopy which I have been following. .   Since last cysto in 09/2015, he was treated for an asymptomatic UTI.    He does have history of smoking, 2-3 ppd x 4-5 years, quit 40 years ago.   He also has a history of BPH on longstanding terazosin and finasteride. Symptoms stable.    PMH: Past Medical History  Diagnosis Date  . BPH (benign prostatic hyperplasia)   . 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 Hypertension   . Bell's palsy   . Cancer ([place])     bladder  . Urinary frequency   . Urinary tract infection   . History of urinary urgency   . Kidney stone   . Hydronephrosis, bilateral   . Deaf     right ear  . Neuroma     acoustic  . Dizziness   . Arthritis     Surgical History: Past Surgical History  Procedure Laterality Date  . Gamma knife      for acoustic neuroma  . Back surgery      cervical fusion  . Cervial fusion    . Carpal tunnel release Bilateral 01/25/2015    Procedure: CARPAL TUNNEL RELEASE;  Surgeon: Christophe Louis, MD;  Location: [facility] ORS;  Service: Orthopedics;  Laterality:  Bilateral;  . Plate in head    . Brain tumor excision    . Transurethral resection of bladder tumor with gyrus (turbt-gyrus)    . Eye surgery    . Cataract extraction w/phaco Left 06/06/2015    Procedure: CATARACT EXTRACTION PHACO AND INTRAOCULAR LENS PLACEMENT (IOC);  Surgeon: Lyla Glassing, MD;  Location: [facility] ORS;  Service: Ophthalmology;  Laterality: Left;  [place]: 01:59.3 AP%: 13.7 CDE: 16.45 Lot # [id number]    Home Medications:    Medication List       This list is accurate as of: 02/19/16  9:12 AM.  Always use your most recent med list.               DUREZOL 0.05 % Emul  Generic drug:   Difluprednate     finasteride 5 MG tablet  Commonly known as:  PROSCAR  Take 1 tablet (5 mg total) by mouth daily. pm     ILEVRO 0.3 % ophthalmic suspension  Generic drug:  nepafenac     lisinopril 20 MG tablet  Commonly known as:  PRINIVIL,ZESTRIL  Take 20 mg by mouth daily. pm     terazosin 5 MG capsule  Commonly known as:  HYTRIN  Take 2 mg by mouth at bedtime. pm     TH CRANBERRY CONCENTRATE PO  Take by mouth.        Allergies:  Allergies  Allergen Reactions  . Percocet [Oxycodone-Acetaminophen] Rash    Family History: Family History  Problem Relation Age of Onset  . Cancer Mother     Social History:  reports that he quit smoking about 37 years ago. He has never used smokeless tobacco. He reports that he drinks alcohol. He reports that he does not use illicit drugs.   Physical Exam: BP 130/74 mmHg  Pulse 73  Ht 5\' 9"  (1.753 m)  Wt 196 lb (88.905 kg)  BMI 28.93 kg/m2  Constitutional:  Alert and oriented, No acute distress. HEENT:  AT, moist mucus membranes.  Trachea midline, no masses. Cardiovascular: No clubbing, cyanosis, or edema. RRR. Respiratory: Normal respiratory effort, no increased work of breathing.  CTAB. GI: Abdomen is soft, nontender, nondistended, no abdominal masses GU: No CVA tenderness. Normal phallus, orthotopic meatus. Skin: No rashes, bruises or suspicious lesions. Neurologic: Grossly intact, no focal deficits, moving all 4 extremities. Psychiatric: Normal mood and affect.  Laboratory Data: Lab Results  Component Value Date   WBC 8.7 06/24/2014   HGB 13.7 06/24/2014   HCT 42.5 06/24/2014   MCV 96 06/24/2014   PLT 164 06/24/2014    Lab Results  Component Value Date   CREATININE 1.70* 02/11/2016    Urinalysis UA Unremarkable other than microscopic hematuria.   Pertinent Imaging: As above, CT Urogram 09/26/14  RUS 2/16 revewed  Cystoscopy Procedure Note  Patient identification was confirmed, informed consent was  obtained, and patient was prepped using Betadine solution.  Lidocaine jelly was administered per urethral meatus.    Preoperative abx where received prior to procedure.     Pre-Procedure: - Inspection reveals a normal caliber ureteral meatus.  Procedure: The flexible cystoscope was introduced without difficulty - No urethral strictures/lesions are present. - Enlarged prostate  - Normal bladder neck - Bilateral ureteral orifices identified - Bladder mucosa  reveals persistent chronic patchy erythema on the posterior wall bladder extending towards right trigone. There is a mucosal flap over the right UO was noted at the time of previous biopsy. The lesions appear to be suspicious for CIS  as previously noted, stable from previous cystoscopy s/p negative biopsy/ urine cytology.  There is now an additional erythematous patch on the contralateral trigone, friable and suspicious. No papillary tumors. - No bladder stones - No trabeculation  Retroflexion with mild intravesical protrusion.   Post-Procedure: - Patient tolerated the procedure well   Assessment & Plan:    1. History of bladder cancer Cystoscopy today with stable right trigonal erythema s/p negative previous biopsy/ urine cytologies and NEW lesion similar in appearence on the left hemitrigone.    Given what appears to be progression of unclear possibly pathologic process within the bladder, I would recommend return to the OR for repeat bladder biopsies. We'll also plan to evaluate the upper tracts as possible with bilateral retrogrades.  Reviewed the risks and benefits of this in detail. All of his questions were answered. He is willing to proceed. Plan for preoperative culture.  - Urinalysis, Complete -UCx (preop) - ciprofloxacin (CIPRO) tablet 500 mg; Take 1 tablet (500 mg total) by mouth once. - lidocaine (XYLOCAINE) 2 % jelly 1 application; Place 1 application into the urethra once.  2. BPH (benign prostatic  hyperplasia) Stable symptoms on finasteride in terazosin. Continue these medications.  3. Kidney stones Incidental small bilateral lower pole stones. Asymptomatic. No history of previous nephrolithiasis. We'll continue to follow.  4. 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  No filling defects. Suspect this is related to previous resection of the UOs on previous TURBT. No overt hydronephrosis on follow-up renal ultrasound on 10/14/2015.  Recent incidentally noted rising creatinine up to 1.7 from previous 1.16 months ago. We will evaluate the kidneys with RTG if pssiblea t the time of bladder bx.  Schedule bladder bx, fulguration, bilateral RTG   Hollice Espy, MD  [facility] [address] [place], [place] [address] [phone]

## 2016-02-21 ENCOUNTER — Telehealth: Payer: Self-pay | Admitting: Radiology

## 2016-02-21 LAB — CULTURE, URINE COMPREHENSIVE

## 2016-02-21 NOTE — Telephone Encounter (Signed)
Notified pt of surgery scheduled 03/11/16, pre-admit testing appt on 02/26/16 @9 :45 & to call 03/09/16 for arrival time to SDS. Pt voices understanding.

## 2016-02-26 ENCOUNTER — Encounter
Admission: RE | Admit: 2016-02-26 | Discharge: 2016-02-26 | Disposition: A | Discharge status: Home / Self Care | Payer: Medicare Other | Source: Ambulatory Visit | Attending: Urology | Admitting: Urology

## 2016-02-26 ENCOUNTER — Encounter: Payer: Self-pay | Admitting: *Deleted

## 2016-02-26 DIAGNOSIS — Z01812 Encounter for preprocedural laboratory examination: Secondary | ICD-10-CM | POA: Insufficient documentation

## 2016-02-26 DIAGNOSIS — Z6828 Body mass index (BMI) 28.0-28.9, adult: Secondary | ICD-10-CM | POA: Diagnosis not present

## 2016-02-26 DIAGNOSIS — I10 Essential (primary) hypertension: Secondary | ICD-10-CM | POA: Diagnosis not present

## 2016-02-26 DIAGNOSIS — D333 Benign neoplasm of cranial nerves: Secondary | ICD-10-CM | POA: Diagnosis not present

## 2016-02-26 DIAGNOSIS — Z01810 Encounter for preprocedural cardiovascular examination: Secondary | ICD-10-CM | POA: Insufficient documentation

## 2016-02-26 LAB — BASIC METABOLIC PANEL
ANION GAP: 5 (ref 5–15)
BUN: 21 mg/dL — ABNORMAL HIGH (ref 6–20)
CHLORIDE: 106 mmol/L (ref 101–111)
CO2: 27 mmol/L (ref 22–32)
CREATININE: 1.3 mg/dL — AB (ref 0.61–1.24)
Calcium: 9 mg/dL (ref 8.9–10.3)
GFR calc non Af Amer: 52 mL/min — ABNORMAL LOW (ref >60)
Glucose, Bld: 127 mg/dL — ABNORMAL HIGH (ref 65–99)
Potassium: 4.4 mmol/L (ref 3.5–5.1)
SODIUM: 138 mmol/L (ref 135–145)

## 2016-02-26 NOTE — Pre-Procedure Instructions (Signed)
MEDICAL CLEARANCE REQUEST/EKG,AS INSTRUCTED BY DR P CARROLL CALLED AND FAXED TO DR [name]. SPOKE WITH ELLEN ALSO CALLED AND FAXED TO AMY AT [facility] OFFICE. SPOKE WITH LISA

## 2016-02-26 NOTE — Patient Instructions (Signed)
  Your procedure is scheduled on March 11, 2016 (Wednesday) Report to Day Surgery. To find out your arrival time please call [phone] between 1PM - 3PM on March 09, 2016 (Monday).  Remember: Instructions that are not followed completely may result in serious medical risk, up to and including death, or upon the discretion of your surgeon and anesthesiologist your surgery may need to be rescheduled.    _x___ 1. Do not eat food or drink liquids after midnight. No gum chewing or hard candies.     _x___ 2. No Alcohol for 24 hours before or after surgery.   _x___ 3. Do Not Smoke For 24 Hours Prior to Your Surgery.   ____ 4. Bring all medications with you on the day of surgery if instructed.    _x___ 5. Notify your doctor if there is any change in your medical condition     (cold, fever, infections).       Do not wear jewelry, make-up, hairpins, clips or nail polish.  Do not wear lotions, powders, or perfumes. You may wear deodorant.  Do not shave 48 hours prior to surgery. Men may shave face and neck.  Do not bring valuables to the hospital.    [facility] is not responsible for any belongings or valuables.               Contacts, dentures or bridgework may not be worn into surgery.  Leave your suitcase in the car. After surgery it may be brought to your room.  For patients admitted to the hospital, discharge time is determined by your                treatment team.   Patients discharged the day of surgery will not be allowed to drive home.   Please read over the following fact sheets that you were given:   Surgical Site Infection Prevention   ____ Take these medicines the morning of surgery with A SIP OF WATER:    1.   2.   3.   4.  5.  6.  ____ Fleet Enema (as directed)   ____ Use CHG Soap as directed  ____ Use inhalers on the day of surgery  ____ Stop metformin 2 days prior to surgery    ____ Take 1/2 of usual insulin dose the night before surgery and none on the  morning of surgery.   _x___ Stop Coumadin/Plavix/aspirin on (NO ASPIRIN OR ASPIRIN PRODUCTS)  _x___ Stop Anti-inflammatories on (NO NSAIDS) TYLENOL OK TO TAKE FOR PAIN IF NEEDED   _x___ Stop supplements until after surgery.  (STOP CRANBERRY, VIT C,E NOW)  ____ Bring C-Pap to the hospital.

## 2016-02-28 DIAGNOSIS — Z01818 Encounter for other preprocedural examination: Secondary | ICD-10-CM | POA: Diagnosis not present

## 2016-02-28 DIAGNOSIS — R9431 Abnormal electrocardiogram [ECG] [EKG]: Secondary | ICD-10-CM | POA: Diagnosis not present

## 2016-02-28 DIAGNOSIS — I10 Essential (primary) hypertension: Secondary | ICD-10-CM | POA: Diagnosis not present

## 2016-02-28 DIAGNOSIS — C679 Malignant neoplasm of bladder, unspecified: Secondary | ICD-10-CM | POA: Diagnosis not present

## 2016-03-03 NOTE — Pre-Procedure Instructions (Signed)
[facility] 02/28/16

## 2016-03-11 ENCOUNTER — Ambulatory Visit
Admission: RE | Admit: 2016-03-11 | Discharge: 2016-03-11 | Disposition: A | Discharge status: Home / Self Care | Payer: Medicare Other | Source: Ambulatory Visit | Attending: Urology | Admitting: Urology

## 2016-03-11 ENCOUNTER — Ambulatory Visit: Payer: Medicare Other | Admitting: Certified Registered"

## 2016-03-11 ENCOUNTER — Encounter
Admission: RE | Disposition: A | Discharge status: Home / Self Care | Payer: Self-pay | Source: Ambulatory Visit | Attending: Urology

## 2016-03-11 ENCOUNTER — Encounter: Payer: Self-pay | Admitting: *Deleted

## 2016-03-11 DIAGNOSIS — Z8603 Personal history of neoplasm of uncertain behavior: Secondary | ICD-10-CM

## 2016-03-11 DIAGNOSIS — Z885 Allergy status to narcotic agent status: Secondary | ICD-10-CM | POA: Diagnosis not present

## 2016-03-11 DIAGNOSIS — H9191 Unspecified hearing loss, right ear: Secondary | ICD-10-CM | POA: Diagnosis not present

## 2016-03-11 DIAGNOSIS — N3289 Other specified disorders of bladder: Secondary | ICD-10-CM | POA: Insufficient documentation

## 2016-03-11 DIAGNOSIS — Z8551 Personal history of malignant neoplasm of bladder: Secondary | ICD-10-CM | POA: Diagnosis not present

## 2016-03-11 DIAGNOSIS — D494 Neoplasm of unspecified behavior of bladder: Secondary | ICD-10-CM | POA: Diagnosis not present

## 2016-03-11 DIAGNOSIS — M1990 Unspecified osteoarthritis, unspecified site: Secondary | ICD-10-CM | POA: Diagnosis not present

## 2016-03-11 DIAGNOSIS — L538 Other specified erythematous conditions: Secondary | ICD-10-CM | POA: Diagnosis not present

## 2016-03-11 DIAGNOSIS — N400 Enlarged prostate without lower urinary tract symptoms: Secondary | ICD-10-CM | POA: Insufficient documentation

## 2016-03-11 DIAGNOSIS — Z79899 Other long term (current) drug therapy: Secondary | ICD-10-CM | POA: Diagnosis not present

## 2016-03-11 DIAGNOSIS — I10 Essential (primary) hypertension: Secondary | ICD-10-CM | POA: Insufficient documentation

## 2016-03-11 DIAGNOSIS — Z87442 Personal history of urinary calculi: Secondary | ICD-10-CM | POA: Diagnosis not present

## 2016-03-11 DIAGNOSIS — N3000 Acute cystitis without hematuria: Secondary | ICD-10-CM | POA: Diagnosis not present

## 2016-03-11 DIAGNOSIS — L539 Erythematous condition, unspecified: Secondary | ICD-10-CM | POA: Diagnosis not present

## 2016-03-11 DIAGNOSIS — Z87891 Personal history of nicotine dependence: Secondary | ICD-10-CM | POA: Diagnosis not present

## 2016-03-11 DIAGNOSIS — N3080 Other cystitis without hematuria: Secondary | ICD-10-CM | POA: Insufficient documentation

## 2016-03-11 HISTORY — PX: CYSTOSCOPY W/ RETROGRADES: SHX1426

## 2016-03-11 HISTORY — PX: CYSTOSCOPY WITH FULGERATION: SHX6638

## 2016-03-11 SURGERY — CYSTOSCOPY, WITH RETROGRADE PYELOGRAM
Anesthesia: General | Wound class: Clean Contaminated

## 2016-03-11 MED ORDER — SODIUM CHLORIDE 0.9 % IV SOLN
INTRAVENOUS | Status: DC
  Administered 2016-03-11: 10:00:00 via INTRAVENOUS

## 2016-03-11 MED ORDER — LIDOCAINE HCL (CARDIAC) 20 MG/ML IV SOLN
INTRAVENOUS | Status: DC | PRN
  Administered 2016-03-11: 100 mg via INTRAVENOUS

## 2016-03-11 MED ORDER — FAMOTIDINE 20 MG PO TABS
ORAL_TABLET | ORAL | Status: AC
  Administered 2016-03-11: 20 mg via ORAL
  Filled 2016-03-11: qty 1

## 2016-03-11 MED ORDER — PROPOFOL 10 MG/ML IV BOLUS
INTRAVENOUS | Status: DC | PRN
  Administered 2016-03-11: 140 mg via INTRAVENOUS
  Administered 2016-03-11: 30 mg via INTRAVENOUS

## 2016-03-11 MED ORDER — DEXTROSE 5 % IV SOLN
1.0000 g | Freq: Once | INTRAVENOUS | Status: AC
  Administered 2016-03-11: 1 g via INTRAVENOUS
  Filled 2016-03-11: qty 10

## 2016-03-11 MED ORDER — FENTANYL CITRATE (PF) 100 MCG/2ML IJ SOLN
25.0000 ug | INTRAMUSCULAR | Status: DC | PRN
  Administered 2016-03-11 (×3): 25 ug via INTRAVENOUS

## 2016-03-11 MED ORDER — ONDANSETRON HCL 4 MG/2ML IJ SOLN: 4.0000 mg | Freq: Once | INTRAMUSCULAR | Status: DC | PRN

## 2016-03-11 MED ORDER — FAMOTIDINE 20 MG PO TABS
20.0000 mg | ORAL_TABLET | Freq: Once | ORAL | Status: AC
  Administered 2016-03-11: 20 mg via ORAL

## 2016-03-11 MED ORDER — DOCUSATE SODIUM 100 MG PO CAPS: 100.0000 mg | ORAL_CAPSULE | Freq: Two times a day (BID) | ORAL | Status: DC

## 2016-03-11 MED ORDER — FENTANYL CITRATE (PF) 100 MCG/2ML IJ SOLN
INTRAMUSCULAR | Status: DC | PRN
  Administered 2016-03-11 (×2): 25 ug via INTRAVENOUS
  Administered 2016-03-11: 50 ug via INTRAVENOUS

## 2016-03-11 MED ORDER — IOTHALAMATE MEGLUMINE 43 % IV SOLN
INTRAVENOUS | Status: DC | PRN
  Administered 2016-03-11: 35 mL

## 2016-03-11 MED ORDER — FENTANYL CITRATE (PF) 100 MCG/2ML IJ SOLN
INTRAMUSCULAR | Status: AC
  Filled 2016-03-11: qty 2

## 2016-03-11 MED ORDER — HYDROCODONE-ACETAMINOPHEN 5-325 MG PO TABS: 1.0000 | ORAL_TABLET | Freq: Four times a day (QID) | ORAL | Status: DC | PRN

## 2016-03-11 SURGICAL SUPPLY — 24 items
BAG DRAIN CYSTO-URO LG1000N (MISCELLANEOUS) ×4 IMPLANT
CATH URETL 5X70 OPEN END (CATHETERS) ×4 IMPLANT
CONRAY 43 FOR UROLOGY 50M (MISCELLANEOUS) ×4 IMPLANT
DRAPE UTILITY 15X26 TOWEL STRL (DRAPES) ×4 IMPLANT
GLOVE BIO SURGEON STRL SZ 6.5 (GLOVE) ×3 IMPLANT
GLOVE BIO SURGEON STRL SZ7 (GLOVE) ×4 IMPLANT
GLOVE BIO SURGEONS STRL SZ 6.5 (GLOVE) ×1
GOWN STRL REUS W/ TWL LRG LVL3 (GOWN DISPOSABLE) ×4 IMPLANT
GOWN STRL REUS W/TWL LRG LVL3 (GOWN DISPOSABLE) ×4
INTRODUCER DILATOR DOUBLE (INTRODUCER) IMPLANT
KIT RM TURNOVER CYSTO AR (KITS) ×4 IMPLANT
PACK CYSTO AR (MISCELLANEOUS) ×4 IMPLANT
PREP PVP WINGED SPONGE (MISCELLANEOUS) IMPLANT
SENSORWIRE 0.038 NOT ANGLED (WIRE) ×4
SET CYSTO W/LG BORE CLAMP LF (SET/KITS/TRAYS/PACK) ×4 IMPLANT
SHEATH URETERAL 12FRX35CM (MISCELLANEOUS) IMPLANT
SOL .9 NS 3000ML IRR  AL (IV SOLUTION)
SOL .9 NS 3000ML IRR UROMATIC (IV SOLUTION) IMPLANT
SOL PREP PVP 2OZ (MISCELLANEOUS)
SOLUTION PREP PVP 2OZ (MISCELLANEOUS) IMPLANT
SURGILUBE 2OZ TUBE FLIPTOP (MISCELLANEOUS) ×4 IMPLANT
WATER STERILE IRR 1000ML POUR (IV SOLUTION) ×4 IMPLANT
WATER STERILE IRR 3000ML UROMA (IV SOLUTION) ×4 IMPLANT
WIRE SENSOR 0.038 NOT ANGLED (WIRE) ×2 IMPLANT

## 2016-03-11 NOTE — Anesthesia Postprocedure Evaluation (Signed)
Anesthesia Post Note  Patient: Justin [name]  Procedure(s) Performed: Procedure(s) (LRB): CYSTOSCOPY WITH RETROGRADE PYELOGRAM (Bilateral) CYSTOSCOPY, BLADDER BIOPSY WITH FULGERATION (N/A)  Patient location during evaluation: PACU Anesthesia Type: General Level of consciousness: awake Pain management: satisfactory to patient Vital Signs Assessment: post-procedure vital signs reviewed and stable Cardiovascular status: blood pressure returned to baseline Anesthetic complications: no    Last Vitals:  Filed Vitals:   03/11/16 1430 03/11/16 1445  BP: 111/68 114/69  Pulse: 57 63  Temp:    Resp: 11 14    Last Pain:  Filed Vitals:   03/11/16 1447  PainSc: 0-No pain                 VAN STAVEREN,Jaidynn Balster

## 2016-03-11 NOTE — H&P (View-Only) (Signed)
9:12 AM  02/19/2016   Justin [name] 1940/09/12 [id number]  Referring provider: [name], PA-C [address] [place], [name] [address]  Chief Complaint  Patient presents with  . 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  He returns today for routine surveillance cystoscopy.   He was formally a patient of Dr. Phil Dopp in [facility]). He underwent TURBT in 07/2011 for a tumor on the right lateral wall which revealed HG T1. Records do not indicate repeat TUR or BCG. He had a recurrence of LG Ta in 03/2013 identified on quarterly surveillance cystoscopy above the right UO.   He was also managed by Dr. Harrison Mons upon moving to [facility]. Work up included IVP and RUS showing mild right hydroureteronephrosis and possible bilateral non obstructing stones. He also underwent cystoscopy in 01/2014 which showed erythema on the right trigone and UO was difficult to identify. Urine cytology from that time was atypical. He elected to follow this lesion and cystoscopy on 05/04/14 showed progression of this lesions noting a 2x2 area in the midline of the bladder extending off to the right trigone with surrounding neovascularity concerning for CIS.   He was taken by myself to OR on 06/13/14 for left retrograde, bladder biopsy, and instillation of mitomycin. There was significant erythema around the right UO and trigone which was biopsied extensively. The right UO was identified but noted to have a small mucosal flap overlying consistent with previous resection of the UO and was unable to be cannulated for right RTG. Efflux of urine was seen from this ureter consent with patency. Pathology showed chronic inflammation but NO evidence of CIS. Urine cytology negative x 2. Follow up CT Urogram on 09/26/14 showed bilateral lower pole nonobstructing stones measuring 2 mm in the left and 3 on the right.  He was also noted to have very mild  bilateral hydroureteronephrsois, right greater than left.   Most recent RUS on 2/17 unremarkable, midly elevated PVR but no overt hydro, Cr stable at 1.16.  He continues to have right trigonal patchy of erythema (previously biopsied, negative) which was remained stable on serial cystoscopy which I have been following. .   Since last cysto in 09/2015, he was treated for an asymptomatic UTI.    He does have history of smoking, 2-3 ppd x 4-5 years, quit 40 years ago.   He also has a history of BPH on longstanding terazosin and finasteride. Symptoms stable.    PMH: Past Medical History  Diagnosis Date  . BPH (benign prostatic hyperplasia)   . 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 Hypertension   . Bell's palsy   . Cancer ([place])     bladder  . Urinary frequency   . Urinary tract infection   . History of urinary urgency   . Kidney stone   . Hydronephrosis, bilateral   . Deaf     right ear  . Neuroma     acoustic  . Dizziness   . Arthritis     Surgical History: Past Surgical History  Procedure Laterality Date  . Gamma knife      for acoustic neuroma  . Back surgery      cervical fusion  . Cervial fusion    . Carpal tunnel release Bilateral 01/25/2015    Procedure: CARPAL TUNNEL RELEASE;  Surgeon: Christophe Louis, MD;  Location: [facility] ORS;  Service: Orthopedics;  Laterality:  Bilateral;  . Plate in head    . Brain tumor excision    . Transurethral resection of bladder tumor with gyrus (turbt-gyrus)    . Eye surgery    . Cataract extraction w/phaco Left 06/06/2015    Procedure: CATARACT EXTRACTION PHACO AND INTRAOCULAR LENS PLACEMENT (IOC);  Surgeon: Lyla Glassing, MD;  Location: [facility] ORS;  Service: Ophthalmology;  Laterality: Left;  [place]: 01:59.3 AP%: 13.7 CDE: 16.45 Lot # [id number]    Home Medications:    Medication List       This list is accurate as of: 02/19/16  9:12 AM.  Always use your most recent med list.               DUREZOL 0.05 % Emul  Generic drug:   Difluprednate     finasteride 5 MG tablet  Commonly known as:  PROSCAR  Take 1 tablet (5 mg total) by mouth daily. pm     ILEVRO 0.3 % ophthalmic suspension  Generic drug:  nepafenac     lisinopril 20 MG tablet  Commonly known as:  PRINIVIL,ZESTRIL  Take 20 mg by mouth daily. pm     terazosin 5 MG capsule  Commonly known as:  HYTRIN  Take 2 mg by mouth at bedtime. pm     TH CRANBERRY CONCENTRATE PO  Take by mouth.        Allergies:  Allergies  Allergen Reactions  . Percocet [Oxycodone-Acetaminophen] Rash    Family History: Family History  Problem Relation Age of Onset  . Cancer Mother     Social History:  reports that he quit smoking about 37 years ago. He has never used smokeless tobacco. He reports that he drinks alcohol. He reports that he does not use illicit drugs.   Physical Exam: BP 130/74 mmHg  Pulse 73  Ht 5\' 9"  (1.753 m)  Wt 196 lb (88.905 kg)  BMI 28.93 kg/m2  Constitutional:  Alert and oriented, No acute distress. HEENT:  AT, moist mucus membranes.  Trachea midline, no masses. Cardiovascular: No clubbing, cyanosis, or edema. RRR. Respiratory: Normal respiratory effort, no increased work of breathing.  CTAB. GI: Abdomen is soft, nontender, nondistended, no abdominal masses GU: No CVA tenderness. Normal phallus, orthotopic meatus. Skin: No rashes, bruises or suspicious lesions. Neurologic: Grossly intact, no focal deficits, moving all 4 extremities. Psychiatric: Normal mood and affect.  Laboratory Data: Lab Results  Component Value Date   WBC 8.7 06/24/2014   HGB 13.7 06/24/2014   HCT 42.5 06/24/2014   MCV 96 06/24/2014   PLT 164 06/24/2014    Lab Results  Component Value Date   CREATININE 1.70* 02/11/2016    Urinalysis UA Unremarkable other than microscopic hematuria.   Pertinent Imaging: As above, CT Urogram 09/26/14  RUS 2/16 revewed  Cystoscopy Procedure Note  Patient identification was confirmed, informed consent was  obtained, and patient was prepped using Betadine solution.  Lidocaine jelly was administered per urethral meatus.    Preoperative abx where received prior to procedure.     Pre-Procedure: - Inspection reveals a normal caliber ureteral meatus.  Procedure: The flexible cystoscope was introduced without difficulty - No urethral strictures/lesions are present. - Enlarged prostate  - Normal bladder neck - Bilateral ureteral orifices identified - Bladder mucosa  reveals persistent chronic patchy erythema on the posterior wall bladder extending towards right trigone. There is a mucosal flap over the right UO was noted at the time of previous biopsy. The lesions appear to be suspicious for CIS  as previously noted, stable from previous cystoscopy s/p negative biopsy/ urine cytology.  There is now an additional erythematous patch on the contralateral trigone, friable and suspicious. No papillary tumors. - No bladder stones - No trabeculation  Retroflexion with mild intravesical protrusion.   Post-Procedure: - Patient tolerated the procedure well   Assessment & Plan:    1. History of bladder cancer Cystoscopy today with stable right trigonal erythema s/p negative previous biopsy/ urine cytologies and NEW lesion similar in appearence on the left hemitrigone.    Given what appears to be progression of unclear possibly pathologic process within the bladder, I would recommend return to the OR for repeat bladder biopsies. We'll also plan to evaluate the upper tracts as possible with bilateral retrogrades.  Reviewed the risks and benefits of this in detail. All of his questions were answered. He is willing to proceed. Plan for preoperative culture.  - Urinalysis, Complete -UCx (preop) - ciprofloxacin (CIPRO) tablet 500 mg; Take 1 tablet (500 mg total) by mouth once. - lidocaine (XYLOCAINE) 2 % jelly 1 application; Place 1 application into the urethra once.  2. BPH (benign prostatic  hyperplasia) Stable symptoms on finasteride in terazosin. Continue these medications.  3. Kidney stones Incidental small bilateral lower pole stones. Asymptomatic. No history of previous nephrolithiasis. We'll continue to follow.  4. 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  No filling defects. Suspect this is related to previous resection of the UOs on previous TURBT. No overt hydronephrosis on follow-up renal ultrasound on 10/14/2015.  Recent incidentally noted rising creatinine up to 1.7 from previous 1.16 months ago. We will evaluate the kidneys with RTG if pssiblea t the time of bladder bx.  Schedule bladder bx, fulguration, bilateral RTG   Hollice Espy, MD  [facility] [address], [place], [place] [address] [phone]

## 2016-03-11 NOTE — Anesthesia Procedure Notes (Signed)
Procedure Name: LMA Insertion Performed by: Dionne Bucy Pre-anesthesia Checklist: Patient identified, Emergency Drugs available, Suction available, Patient being monitored and Timeout performed Patient Re-evaluated:Patient Re-evaluated prior to inductionOxygen Delivery Method: Circle system utilized Preoxygenation: Pre-oxygenation with 100% oxygen Intubation Type: IV induction LMA: LMA inserted LMA Size: 5.0 Number of attempts: 1 Tube secured with: Tape Dental Injury: Teeth and Oropharynx as per pre-operative assessment

## 2016-03-11 NOTE — Anesthesia Preprocedure Evaluation (Addendum)
Anesthesia Evaluation  Patient identified by MRN, date of birth, ID band Patient awake    Reviewed: Allergy & Precautions, NPO status , Patient's Chart, lab work & pertinent test results  History of Anesthesia Complications Negative for: history of anesthetic complications  Airway Mallampati: III       Dental  (+) Missing   Pulmonary neg pulmonary ROS, former smoker,           Cardiovascular hypertension, Pt. on medications      Neuro/Psych negative neurological ROS     GI/Hepatic negative GI ROS, Neg liver ROS,   Endo/Other  negative endocrine ROS  Renal/GU      Musculoskeletal   Abdominal   Peds  Hematology negative hematology ROS (+)   Anesthesia Other Findings   Reproductive/Obstetrics                            Anesthesia Physical Anesthesia Plan  ASA: III  Anesthesia Plan: General   Post-op Pain Management:    Induction: Intravenous  Airway Management Planned: LMA  Additional Equipment:   Intra-op Plan:   Post-operative Plan:   Informed Consent: I have reviewed the patients History and Physical, chart, labs and discussed the procedure including the risks, benefits and alternatives for the proposed anesthesia with the patient or authorized representative who has indicated his/her understanding and acceptance.     Plan Discussed with:   Anesthesia Plan Comments:         Anesthesia Quick Evaluation

## 2016-03-11 NOTE — Discharge Instructions (Signed)
Transurethral Resection of Bladder Tumor (TURBT) or Bladder Biopsy ° ° °Definition: ° Transurethral Resection of the Bladder Tumor is a surgical procedure used to diagnose and remove tumors within the bladder. TURBT is the most common treatment for early stage bladder cancer. ° °General instructions: °   ° Your recent bladder surgery requires very little post hospital care but some definite precautions. ° °Despite the fact that no skin incisions were used, the area around the bladder incisions are raw and covered with scabs to promote healing and prevent bleeding. Certain precautions are needed to insure that the scabs are not disturbed over the next 2-4 weeks while the healing proceeds. ° °Because the raw surface inside your bladder and the irritating effects of urine you may expect frequency of urination and/or urgency (a stronger desire to urinate) and perhaps even getting up at night more often. This will usually resolve or improve slowly over the healing period. You may see some blood in your urine over the first 6 weeks. Do not be alarmed, even if the urine was clear for a while. Get off your feet and drink lots of fluids until clearing occurs. If you start to pass clots or don't improve call us. ° °Diet: ° °You may return to your normal diet immediately. Because of the raw surface of your bladder, alcohol, spicy foods, foods high in acid and drinks with caffeine may cause irritation or frequency and should be used in moderation. To keep your urine flowing freely and avoid constipation, drink plenty of fluids during the day (8-10 glasses). Tip: Avoid cranberry juice because it is very acidic. ° °Activity: ° °Your physical activity doesn't need to be restricted. However, if you are very active, you may see some blood in the urine. We suggest that you reduce your activity under the circumstances until the bleeding has stopped. ° °Bowels: ° °It is important to keep your bowels regular during the postoperative  period. Straining with bowel movements can cause bleeding. A bowel movement every other day is reasonable. Use a mild laxative if needed, such as milk of magnesia 2-3 tablespoons, or 2 Dulcolax tablets. Call if you continue to have problems. If you had been taking narcotics for pain, before, during or after your surgery, you may be constipated. Take a laxative if necessary. ° ° ° °Medication: ° °You should resume your pre-surgery medications unless told not to. In addition you may be given an antibiotic to prevent or treat infection. Antibiotics are not always necessary. All medication should be taken as prescribed until the bottles are finished unless you are having an unusual reaction to one of the drugs. ° ° °[facility] °[address], [address] °[place], [place] [address] °[phone] ° ° °AMBULATORY SURGERY  °DISCHARGE INSTRUCTIONS ° ° °1) The drugs that you were given will stay in your system until tomorrow so for the next 24 hours you should not: ° °A) Drive an automobile °B) Make any legal decisions °C) Drink any alcoholic beverage ° ° °2) You may resume regular meals tomorrow.  Today it is better to start with liquids and gradually work up to solid foods. ° °You may eat anything you prefer, but it is better to start with liquids, then soup and crackers, and gradually work up to solid foods. ° ° °3) Please notify your doctor immediately if you have any unusual bleeding, trouble breathing, redness and pain at the surgery site, drainage, fever, or pain not relieved by medication. ° ° ° °4)   4) Additional Instructions:        Please contact your physician with any problems or Same Day Surgery at [phone], Monday through Friday 6 am to 4 pm, or [place] at [facility] number at [phone]. AMBULATORY SURGERY  DISCHARGE INSTRUCTIONS   5) The drugs that you were given will stay in your system until tomorrow so for the next 24 hours you should not:  D) Drive an  automobile E) Make any legal decisions F) Drink any alcoholic beverage   6) You may resume regular meals tomorrow.  Today it is better to start with liquids and gradually work up to solid foods.  You may eat anything you prefer, but it is better to start with liquids, then soup and crackers, and gradually work up to solid foods.   7) Please notify your doctor immediately if you have any unusual bleeding, trouble breathing, redness and pain at the surgery site, drainage, fever, or pain not relieved by medication.    8) Additional Instructions:        Please contact your physician with any problems or Same Day Surgery at [phone], Monday through Friday 6 am to 4 pm, or [place] at [facility] number at [phone].AMBULATORY SURGERY  DISCHARGE INSTRUCTIONS   9) The drugs that you were given will stay in your system until tomorrow so for the next 24 hours you should not:  G) Drive an automobile H) Make any legal decisions I) Drink any alcoholic beverage   10) You may resume regular meals tomorrow.  Today it is better to start with liquids and gradually work up to solid foods.  You may eat anything you prefer, but it is better to start with liquids, then soup and crackers, and gradually work up to solid foods.   11) Please notify your doctor immediately if you have any unusual bleeding, trouble breathing, redness and pain at the surgery site, drainage, fever, or pain not relieved by medication.    12) Additional Instructions:        Please contact your physician with any problems or Same Day Surgery at [phone], Monday through Friday 6 am to 4 pm, or  at [facility] number at [phone].

## 2016-03-11 NOTE — Op Note (Signed)
Date of procedure: 03/11/2016  Preoperative diagnosis:  1. History of bladder cancer 2. Bladder erythema   Postoperative diagnosis:  1. Same as above   Procedure: 1. Cystoscopy 2. Bilateral retrograde pyelogram 3. Bladder biopsy times multiple  Surgeon: Hollice Espy, MD  Anesthesia: General  Complications: None  Intraoperative findings: 2 cm area patchy erythema persistent adjacent to right UO. There is also 2 additional patches on the trigone and beyond the left UO which have progressed. Bilateral retrograde was essentially unremarkable.  EBL: Minimal  Specimens: Bladder biopsy  Drains: None  Indication: Justin [name] is a 75 y.o. 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  After reviewing the management options for treatment, he elected to proceed with the above surgical procedure(s). We have discussed the potential benefits and risks of the procedure, side effects of the proposed treatment, the likelihood of the patient achieving the goals of the procedure, and any potential problems that might occur during the procedure or recuperation. Informed consent has been obtained.  Description of procedure:  The patient was taken to the operating room and general anesthesia was induced.  The patient was placed in the dorsal lithotomy position, prepped and draped in the usual sterile fashion, and preoperative antibiotics were administered. A preoperative time-out was performed.   A 21 French scope was advanced per urethra into the bladder. Of note, the prostatic length was relatively short and not particularly obstructive in appearance. Within the bladder, careful cystoscopy was performed which revealed a 2 cm erythematous patch adjacent to a stellate scar just beyond the right UO. The right UO has an unusual appearance consistent with previous resection presumably on the  side. There is also an erythematous patch in the midline of the trigone and a new erythematous patch just beyond the left UO which had not been previously appreciated. There were no other lesions within the bladder, papillary tumor, or stones. Minimal bladder trabeculation.  Attention was then turned to the left ureteral orifice which was cannulated using a sensor wire and a 5 [place] open-ended ureteral catheter just within the UO. Contrast was injected gently which revealed a delicate appearing ureter with mild physiologic dilation in the mid ureter without filling defects or other lesions. There is no hydronephrosis or upper tract abnormalities. Attention was then turned to the right ureteral orifice. Cannulating this UO was somewhat difficult given its shape, but I was eventually able to do so. Potential retrograde pyelogram was performed on this side again revealing a mildly full collecting system without any overt hydroureteronephrosis, filling defects, or any other concerning lesions.  Next, call cup biopsy forceps rate used to biopsy the right-sided erythematous patch in approximate 4 locations. The trigone was also biopsied along with the mucosa just beyond the left UO. A total of approximately 7 or so biopsies were performed. These were all passed off together as specimen. The base of each of the biopsy sites were fulgurated using Bugbee electrocautery. After adequate hemostasis was achieved, the cystoscope was removed and the patient was reversed from anesthesia. He was repositioned supine position, taken to the PACU in stable condition.  Plan: Plan for follow-up for pathology review in 2 weeks. Incidentally, the patient notes worsening of his urinary symptoms were the past week. He would like to discuss this as well as follow-up.  Hollice Espy, M.D.  2

## 2016-03-11 NOTE — Transfer of Care (Signed)
Immediate Anesthesia Transfer of Care Note  Patient: Justin [name]  Procedure(s) Performed: Procedure(s): CYSTOSCOPY WITH RETROGRADE PYELOGRAM (Bilateral) CYSTOSCOPY, BLADDER BIOPSY WITH FULGERATION (N/A)  Patient Location: PACU  Anesthesia Type:General  Level of Consciousness: sedated  Airway & Oxygen Therapy: Patient Spontanous Breathing and Patient connected to face mask oxygen  Post-op Assessment: Report given to RN  Post vital signs: Reviewed and stable  Last Vitals:  Filed Vitals:   03/11/16 0933 03/11/16 1415  BP: 135/80 96/55  Pulse: 71 59  Temp: 36.7 C 36.5 C  Resp: 18 12    Last Pain:  Filed Vitals:   03/11/16 1418  PainSc: 0-No pain         Complications: No apparent anesthesia complications

## 2016-03-11 NOTE — Progress Notes (Signed)
Pain level 3 on discharge

## 2016-03-11 NOTE — Interval H&P Note (Signed)
History and Physical Interval Note:  03/11/2016 1:11 PM  Justin [name]  has presented today for surgery, with the diagnosis of BLADDER ERYTHEMA, H/O NEOPLASM OF BLADDER  The various methods of treatment have been discussed with the patient and family. After consideration of risks, benefits and other options for treatment, the patient has consented to  Procedure(s): CYSTOSCOPY WITH RETROGRADE PYELOGRAM (Bilateral) CYSTOSCOPY, BLADDER BIOPSY WITH FULGERATION (N/A) as a surgical intervention .  The patient's history has been reviewed, patient examined, no change in status, stable for surgery.  I have reviewed the patient's chart and labs.  Questions were answered to the patient's satisfaction.     Hollice Espy

## 2016-03-12 ENCOUNTER — Encounter: Payer: Self-pay | Admitting: Urology

## 2016-03-12 ENCOUNTER — Telehealth: Payer: Self-pay | Admitting: Urology

## 2016-03-12 NOTE — Telephone Encounter (Signed)
Pt called and stating he is passing straight blood after procedure with Dr. Erlene Quan yesterday.  He was not doing this before.  He would like a call back A.S.A.[phone]

## 2016-03-12 NOTE — Telephone Encounter (Signed)
Spoke with patient and explained to him that seeing blood in his urine post cysto bladder bx and fulguration is normal and to be expected. Patient described his urine to be a dark red/brown color and did pass some clots earlier today.  Patient is not having pain or any other urinary symptoms. It was explained to the patient that passing clots and having blood in the urine (used food coloring reference) should be expected for the next few days. He is to contact the office if he has increasing urinary symptoms, dark merlot with thick clotted blood in the urine, bladder pain/spasms, or unable to urinate.  Patient verbalized understanding and is in agreement with this plan.

## 2016-03-16 LAB — SURGICAL PATHOLOGY

## 2016-03-19 ENCOUNTER — Ambulatory Visit: Payer: Medicare Other | Admitting: Podiatry

## 2016-03-25 ENCOUNTER — Ambulatory Visit: Payer: Self-pay | Admitting: Urology

## 2016-03-26 ENCOUNTER — Encounter: Payer: Self-pay | Admitting: Podiatry

## 2016-03-26 ENCOUNTER — Ambulatory Visit (INDEPENDENT_AMBULATORY_CARE_PROVIDER_SITE_OTHER): Payer: Medicare Other | Admitting: Podiatry

## 2016-03-26 DIAGNOSIS — M79676 Pain in unspecified toe(s): Secondary | ICD-10-CM | POA: Diagnosis not present

## 2016-03-26 DIAGNOSIS — B351 Tinea unguium: Secondary | ICD-10-CM

## 2016-03-26 NOTE — Progress Notes (Signed)
Patient ID: Justin [name], male   DOB: June 12, 1941, 75 y.o.   MRN: [id number]  Subjective: 75 y.o. returns the office today for painful, elongated, thickened toenails which they cannot trim themself. Denies any redness or drainage around the nails. He is asking for other treatment options for nail fungus. Denies any acute changes since last appointment and no new complaints today. Denies any systemic complaints such as fevers, chills, nausea, vomiting.   Objective: AAO 3, NAD DP/PT pulses palpable, CRT less than 3 seconds Nails hypertrophic, dystrophic, elongated, brittle, discolored 10. There is tenderness overlying the nails 1-5 bilaterally. There is no surrounding erythema or drainage along the nail sites. No open lesions or pre-ulcerative lesions are identified. No other areas of tenderness bilateral lower extremities. No overlying edema, erythema, increased warmth. No pain with calf compression, swelling, warmth, erythema.  Assessment: Patient presents with symptomatic onychomycosis  Plan: -Treatment options including alternatives, risks, complications were discussed -Nails sharply debrided 10 without complication/bleeding. -Ordered compound cream for onychomycosis.   -Discussed daily foot inspection. If there are any changes, to call the office immediately.  -Follow-up in 3 months or sooner if any problems are to arise. In the meantime, encouraged to call the office with any questions, concerns, changes symptoms.  Celesta Gentile, DPM

## 2016-03-27 ENCOUNTER — Ambulatory Visit (INDEPENDENT_AMBULATORY_CARE_PROVIDER_SITE_OTHER): Payer: Medicare Other | Admitting: Urology

## 2016-03-27 VITALS — BP 116/70 | HR 74 | Ht 70.0 in | Wt 194.0 lb

## 2016-03-27 DIAGNOSIS — Z8551 Personal history of malignant neoplasm of bladder: Secondary | ICD-10-CM | POA: Diagnosis not present

## 2016-03-27 DIAGNOSIS — R351 Nocturia: Secondary | ICD-10-CM

## 2016-03-27 DIAGNOSIS — R3914 Feeling of incomplete bladder emptying: Secondary | ICD-10-CM

## 2016-03-27 DIAGNOSIS — N401 Enlarged prostate with lower urinary tract symptoms: Secondary | ICD-10-CM | POA: Diagnosis not present

## 2016-03-27 DIAGNOSIS — N400 Enlarged prostate without lower urinary tract symptoms: Secondary | ICD-10-CM | POA: Diagnosis not present

## 2016-03-27 LAB — BLADDER SCAN AMB NON-IMAGING: SCAN RESULT: 152

## 2016-03-27 MED ORDER — TAMSULOSIN HCL 0.4 MG PO CAPS: 0.4000 mg | ORAL_CAPSULE | Freq: Every day | ORAL | Status: DC

## 2016-03-27 NOTE — Progress Notes (Signed)
03/27/2016 10:55 AM   Justin [name] 07-Dec-1940 [id number]  Referring provider: [name], PA-C [address] [place], [place] [address]  Chief Complaint  Patient presents with  . Routine Post Op    path results    HPI: 75 year old male   History of bladder cancer  dx bladder cancer in 07/2011, HgT1 and recurrent with LgTa 03/2013. TURBT in 07/2011 for a tumor on the right lateral wall which revealed HG T1. Records do not indicate repeat TUR or BCG  Recurrence of LG Ta in 03/2013 identified on quarterly surveillance cystoscopy above the right UO.   He was also managed by Dr. Harrison Mons upon moving to [facility]. Work up included IVP and RUS showing mild right hydroureteronephrosis and possible bilateral non obstructing stones. He also underwent cystoscopy in 01/2014 which showederythema on the right trigone and UO was difficult to identify. Urine cytology from that time was atypical. He elected to follow this lesion and cystoscopy on 05/04/14 showed progression of this lesions noting a 2x2 area in the midline of the bladder extending off to the right trigone with surrounding neovascularity concerning for CIS.   He was taken by myself to OR on 06/13/14 for left retrograde, bladder biopsy, and instillation of mitomycin. There was significant erythema around the right UO and trigone which was biopsied extensively. The right UO was identified but noted to have a small mucosal flap overlying consistent with previous resection of the UO and was unable to be cannulated for right RTG. Efflux of urine was seen from this ureter consent with patency. Pathology showed chronic inflammation but NO evidence of CIS. Urine cytology negative x 2. Follow up CT Urogram on 09/26/14 showed bilateral lower pole nonobstructing stones measuring 2 mm in the left and 3 on the right.  He was also noted to have very mild bilateral hydroureteronephrsois, right greater than left.    He continued to have right  trigonal patchy of erythema which was remained stable on serial cystoscopy and most recently progress across the trigone.  He returned to the OR on  03/11/16 for bladder biopsy and bilateral retrograde pyelogram.  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  Retrograde pyelogram bilaterally was normal.  He does have history of smoking, 2-3 ppd x 4-5 years, quit 40 years ago.    BPH with LUTS/ incomplete bladder emptying/ nocturia He also has a history of BPH on longstanding terazosin and finasteride.    Over the past few months, he reports significant worsening of his urinary symptoms.He reports during the daytime frequency ~q2 hours with some mild urgency with not incontinence.  He has severe hestiancy with a weak stream and does not feel like he is able to empty fully.  Nocturia q1 hourly at night.  He does snore.  He has never had a sleep study.    PVR today elevated 152 cc   PMH: Past Medical History  Diagnosis Date  . BPH (benign prostatic hyperplasia)   . 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 Hypertension   . Urinary frequency   . Urinary tract infection   . History of urinary urgency   . Kidney stone   . Hydronephrosis, bilateral   . Deaf     right ear  . Neuroma     acoustic  . Dizziness   . Arthritis   . Bell's palsy   . Cancer ([place])  bladder    Surgical History: Past Surgical History  Procedure Laterality Date  . Gamma knife      for acoustic neuroma  . Back surgery      cervical fusion  . Cervial fusion    . Carpal tunnel release Bilateral 01/25/2015    Procedure: CARPAL TUNNEL RELEASE;  Surgeon: Christophe Louis, MD;  Location: [facility] ORS;  Service: Orthopedics;  Laterality: Bilateral;  . Plate in head    . Transurethral resection of bladder tumor with gyrus (turbt-gyrus)    . Cataract extraction w/phaco Left 06/06/2015    Procedure: CATARACT EXTRACTION PHACO AND  INTRAOCULAR LENS PLACEMENT (IOC);  Surgeon: Lyla Glassing, MD;  Location: [facility] ORS;  Service: Ophthalmology;  Laterality: Left;  [place]: 01:59.3 AP%: 13.7 CDE: 16.45 Lot # [id number]  . Brain tumor excision  2012    Radiation therapy  . Eye surgery Left     Cataract extraction with IOL  . Cystoscopy w/ retrogrades Bilateral 03/11/2016    Procedure: CYSTOSCOPY WITH RETROGRADE PYELOGRAM;  Surgeon: Hollice Espy, MD;  Location: [facility] ORS;  Service: Urology;  Laterality: Bilateral;  . Cystoscopy with fulgeration N/A 03/11/2016    Procedure: CYSTOSCOPY, BLADDER BIOPSY WITH FULGERATION;  Surgeon: Hollice Espy, MD;  Location: [facility] ORS;  Service: Urology;  Laterality: N/A;    Home Medications:    Medication List       This list is accurate as of: 03/27/16 10:55 AM.  Always use your most recent med list.               finasteride 5 MG tablet  Commonly known as:  PROSCAR  Take 1 tablet (5 mg total) by mouth daily. pm     lisinopril 20 MG tablet  Commonly known as:  PRINIVIL,ZESTRIL  Take 20 mg by mouth at bedtime. pm     terazosin 5 MG capsule  Commonly known as:  HYTRIN  Take 5 mg by mouth at bedtime.     TH CRANBERRY CONCENTRATE PO  Take 1 tablet by mouth daily.        Allergies:  Allergies  Allergen Reactions  . Percocet [Oxycodone-Acetaminophen] Rash    Family History: Family History  Problem Relation Age of Onset  . Cancer Mother     Social History:  reports that he quit smoking about 37 years ago. His smoking use included Cigarettes. He smoked 2.00 packs per day. He has never used smokeless tobacco. He reports that he drinks alcohol. He reports that he does not use illicit drugs.  ROS: UROLOGY Frequent Urination?: Yes Hard to postpone urination?: No Burning/pain with urination?: No Get up at night to urinate?: Yes Leakage of urine?: Yes Urine stream starts and stops?: No Trouble starting stream?: No Do you have to strain to urinate?: No Blood in urine?:  Yes Urinary tract infection?: No Sexually transmitted disease?: No Injury to kidneys or bladder?: No Painful intercourse?: No Weak stream?: No Erection problems?: No Penile pain?: No  Gastrointestinal Nausea?: No Vomiting?: No Indigestion/heartburn?: No Diarrhea?: No Constipation?: No  Constitutional Fever: No Night sweats?: No Weight loss?: No Fatigue?: No  Skin Skin rash/lesions?: No Itching?: No  Eyes Blurred vision?: No Double vision?: No  Ears/Nose/Throat Sore throat?: No Sinus problems?: No  Hematologic/Lymphatic Swollen glands?: No Easy bruising?: No  Cardiovascular Leg swelling?: No Chest pain?: No  Respiratory Cough?: No Shortness of breath?: No  Endocrine Excessive thirst?: No  Musculoskeletal Back pain?: No Joint pain?: No  Neurological Headaches?: No Dizziness?: No  Psychologic Depression?:  No Anxiety?: No  Physical Exam: BP 116/70 mmHg  Pulse 74  Ht 5\' 10"  (1.778 m)  Wt 194 lb (87.998 kg)  BMI 27.84 kg/m2  Constitutional:  Alert and oriented, No acute distress.   Wife today. HEENT: [place] AT, moist mucus membranes.  Trachea midline, no masses. Cardiovascular: No clubbing, cyanosis, or edema.  RRR. Respiratory: Normal respiratory effort, no increased work of breathing.  CTAB. GI: Abdomen is soft, nontender, nondistended, no abdominal masses GU: No CVA tenderness. Rectal: Normal sphincter tone, 30 cc prostate, irregular prostatic contour particularly at the apex but no discrete firm nodules Skin: No rashes, bruises or suspicious lesions. Neurologic: Grossly intact, no focal deficits, moving all 4 extremities. Psychiatric: Normal mood and affect.  Laboratory Data: Lab Results  Component Value Date   WBC 8.7 06/24/2014   HGB 13.7 06/24/2014   HCT 42.5 06/24/2014   MCV 96 06/24/2014   PLT 164 06/24/2014    Lab Results  Component Value Date   CREATININE 1.30* 02/26/2016    PSA drawn today   Pertinent  Imaging: Reviewed cross-sectional imaging in the form of CT abdomen with and without pelvis on 09/2014 reviewed personally today. Prostate is enlarged but pierced appropriate size for transurethral resection of the prostate.  Assessment & Plan:    1. Benign prostatic hypertrophy (BPH) with incomplete bladder emptying We delivered the discussion today about how to proceed with treatment of his worsening urinary symptoms. He is quite frustrated today with his progressing in terms. In order to optimize his medications, I recommended continuation of finasteride and change in his medications from terazosin 5 mg to Flomax 0.4 mg.  Given his history of dizziness due to an ear issues, in order to optimize his toes, we'll make the switch to Flomax. This may help potentially some with his progressing symptoms while awaiting further treatment.  We had a lengthy discussion today about consideration of surgical management. We discussed the possibility of transurethral resection of the prostate to help reduce his outlet. He does have significant obstructive urinary symptoms including incomplete bladder emptying, urinary hesitancy, and weak stream. I explained that an outlet procedure will primarily help with his obstructive voiding symptoms and may not improve his urinary frequency and especially not as nocturia.    We discussed the potential risk and benefits of TURP procedure including the preoperative, intraoperative, and postoperative course. All his questions were answered. Risks including bleeding, infection, need for further procedures, damage to surrounding structures, stress urinary incontinence, retrograde ejaculation, need for overnight continuous bladder irrigation, and failure to improve his symptoms were discussed in detail. He understands these risks and is willing to proceed as planned.  - PSA - BLADDER SCAN AMB NON-IMAGING  2. Nocturia Discussed today that his progressive nighttime symptoms may not  resolve with an outlet procedure. He does snore at night and I suspect he has undiagnosed sleep apnea. I'm encouraged him to follow up with his primary care for further discussion of a sleep study. We discussed the physiology as it relates to nighttime voiding symptoms.   3. History of bladder cancer Status post bladder biopsy 2 for a suspicious patchy erythematous spot on the trigone. Each of these in the negative. His last documented recurrence was in 2014. As such, I recommended cystoscopy twice a year and then annually as per surveillance protocol.   Hollice Espy, MD  [facility] [address], [place], [place] [address] [phone]

## 2016-03-28 LAB — PSA: Prostate Specific Ag, Serum: 0.6 ng/mL (ref 0.0–4.0)

## 2016-03-29 ENCOUNTER — Encounter: Payer: Self-pay | Admitting: Urology

## 2016-03-30 ENCOUNTER — Telehealth: Payer: Self-pay | Admitting: Radiology

## 2016-03-30 ENCOUNTER — Telehealth: Payer: Self-pay | Admitting: *Deleted

## 2016-03-30 NOTE — Telephone Encounter (Signed)
Have him come in for a biospy of the nail before going with oral treatment to determine which would be best for him.

## 2016-03-30 NOTE — Telephone Encounter (Addendum)
Pt states he found out why his medication was not covered.  Pt states the medication would be covered as a tablet and is not covered for an ointment.  Pt asked if he could have his medication changed to the oral form. 03/31/2016-Pt states he had decided to go with the topical onychomycosis medication, and sent the pharmacy a check.

## 2016-03-30 NOTE — Telephone Encounter (Signed)
Spoke with pt regarding surgery with Dr Erlene Quan. Pt would like to get a second opinion & will call back later this week to discuss whether he wishes to proceed.

## 2016-04-02 DIAGNOSIS — C679 Malignant neoplasm of bladder, unspecified: Secondary | ICD-10-CM | POA: Diagnosis not present

## 2016-04-02 DIAGNOSIS — Z6831 Body mass index (BMI) 31.0-31.9, adult: Secondary | ICD-10-CM | POA: Diagnosis not present

## 2016-04-02 DIAGNOSIS — Z79899 Other long term (current) drug therapy: Secondary | ICD-10-CM | POA: Diagnosis not present

## 2016-04-02 DIAGNOSIS — N400 Enlarged prostate without lower urinary tract symptoms: Secondary | ICD-10-CM | POA: Diagnosis not present

## 2016-05-17 IMAGING — MR MR HEAD WO/W CM
10 of 11 series · 38 of 48 positions shown · IV contrast (multihance)
Comparison: Outside cervical spine MRI 09/17/2011.

CLINICAL DATA: 74-year-old male with history of gamma knife
treatment for acoustic neuroma 5 years ago. Dizziness, headaches and
chronic right side hearing loss. Subsequent encounter.

EXAM:
MRI HEAD WITHOUT AND WITH CONTRAST
TECHNIQUE: Multiplanar, multiecho pulse sequences of the brain and surrounding
structures were obtained without and with intravenous contrast.
CONTRAST:  18mL MULTIHANCE GADOBENATE DIMEGLUMINE 529 MG/ML IV SOLN

[Series 2: T1 · sagittal · 5.0mm · 0.45mm/px · 5 of 29 slices shown (1 of 5)]
[im 1/29]
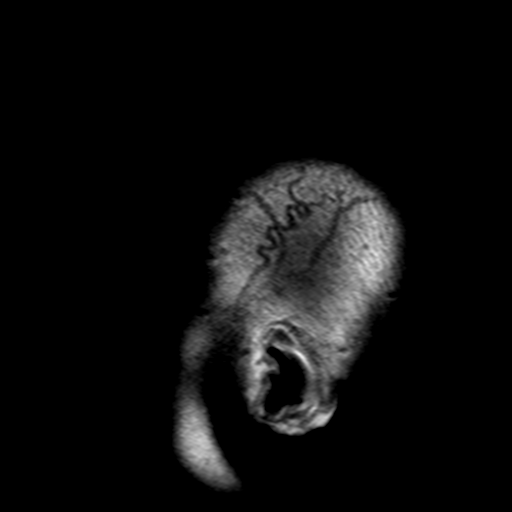
[im 8/29]
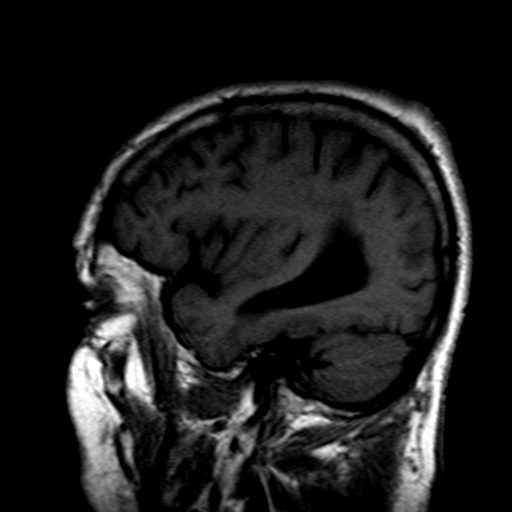
[im 15/29]
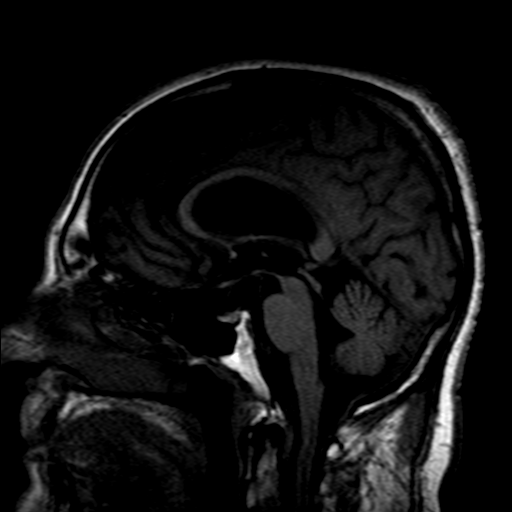
[im 22/29]
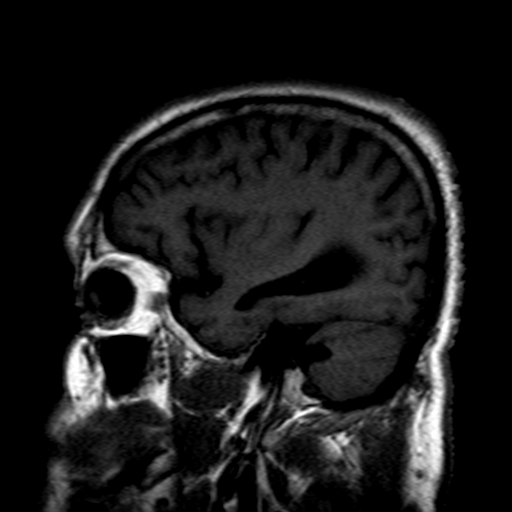
[im 29/29]
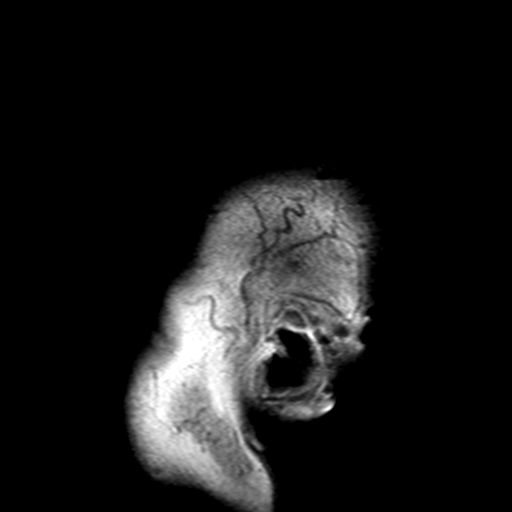

[Series 3: DWI · axial · 4.0mm · 1.80mm/px · z∈[-51,+115]mm · 9 of 98 slices shown (1 of 2)]
[im 1/98]
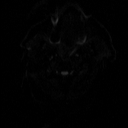
[im 18/98]
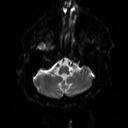
[im 27/98]
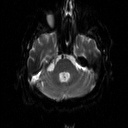
[im 45/98]
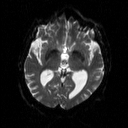
[im 53/98]
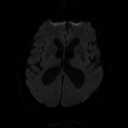
[im 71/98]
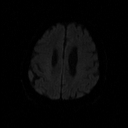
[im 80/98]
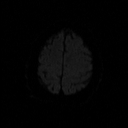
[im 89/98]
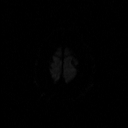
[im 98/98]
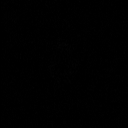

[Series 5: DWI · axial · 4.0mm · 1.80mm/px · z∈[-51,+115]mm · 4 of 33 slices shown (2 of 2)]
[im 1/33]
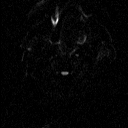
[im 11/33]
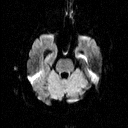
[im 22/33]
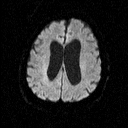
[im 33/33]
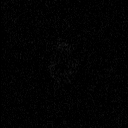

[Series 6: T2 · axial · 5.0mm · 0.45mm/px · z∈[-52,+117]mm · 3 of 27 slices shown]
[im 1/27]
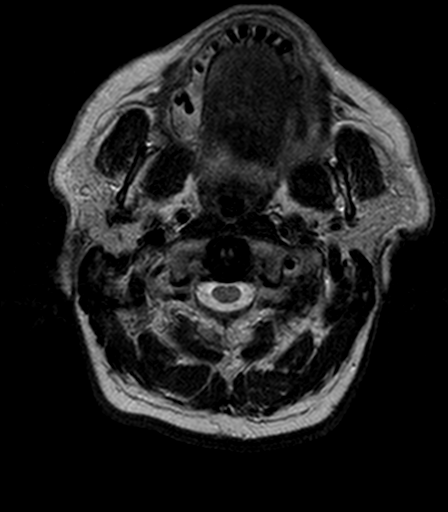
[im 14/27]
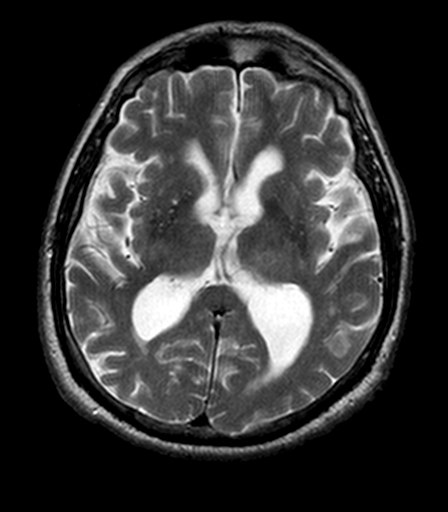
[im 27/27]
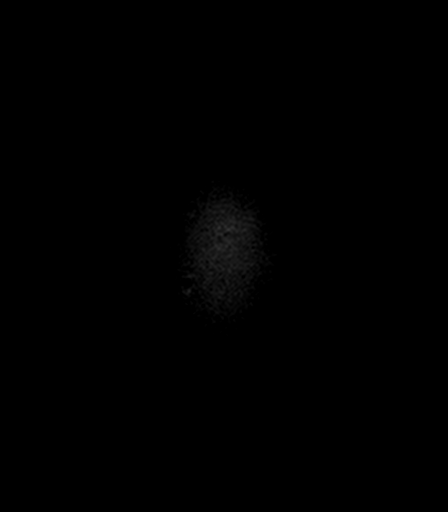

[Series 8: T1 · coronal · 3.0mm · 0.70mm/px · 2 of 13 slices shown (2 of 5)]
[im 1/13]
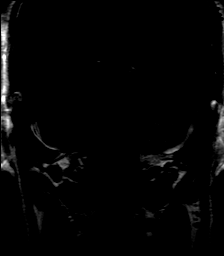
[im 13/13]
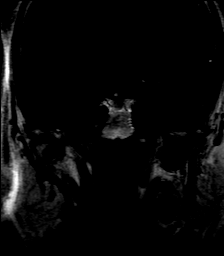

[Series 9: FLAIR · axial · 5.0mm · 0.90mm/px · z∈[-52,+117]mm · 3 of 27 slices shown]
[im 1/27]
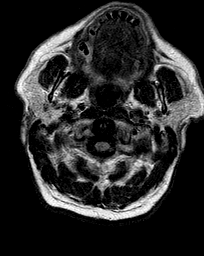
[im 14/27]
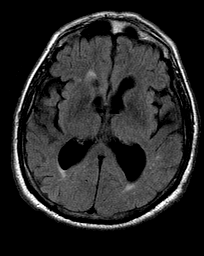
[im 27/27]
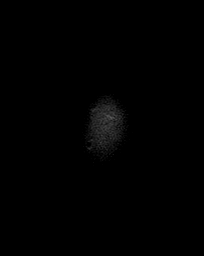

[Series 11: T1 · axial · 3.0mm · 0.70mm/px · 1 of 11 slices shown (3 of 5)]
[im 1/11]
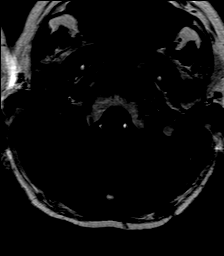

[Series 13: T1 · axial · 3.0mm · 0.70mm/px · 1 of 11 slices shown (4 of 5)]
[im 1/11]
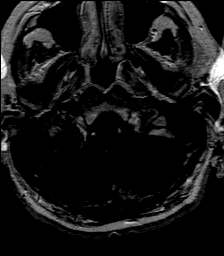

[Series 14: T1 post-contrast · axial · 3.0mm · 0.45mm/px · z∈[-61,+116]mm · 8 of 60 slices shown]
[im 1/60]
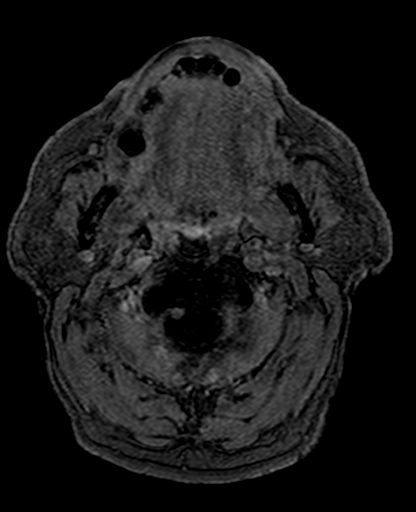
[im 9/60]
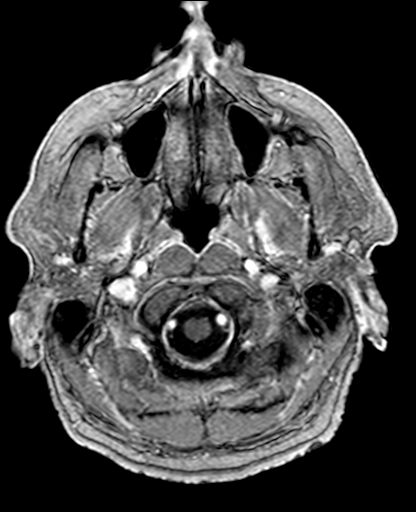
[im 17/60]
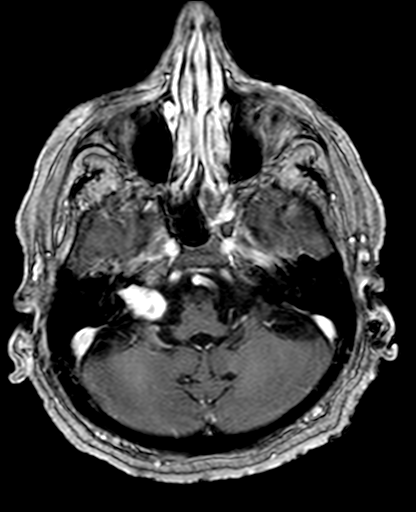
[im 26/60]
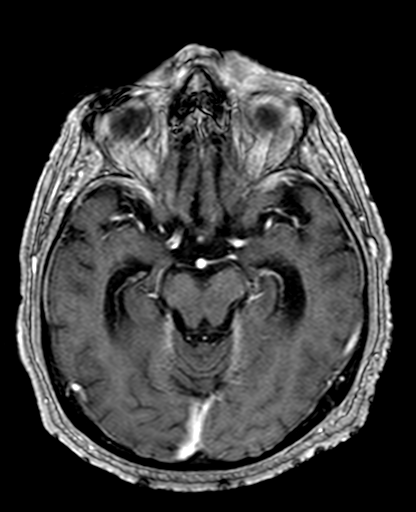
[im 34/60]
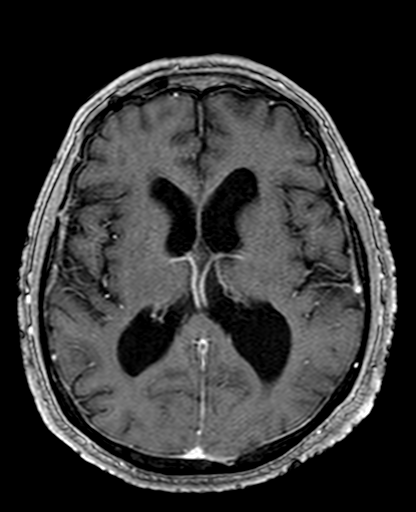
[im 43/60]
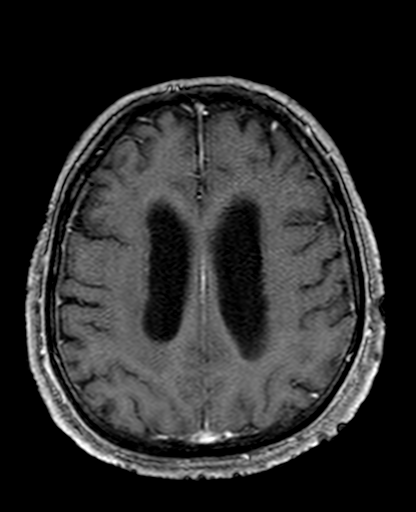
[im 51/60]
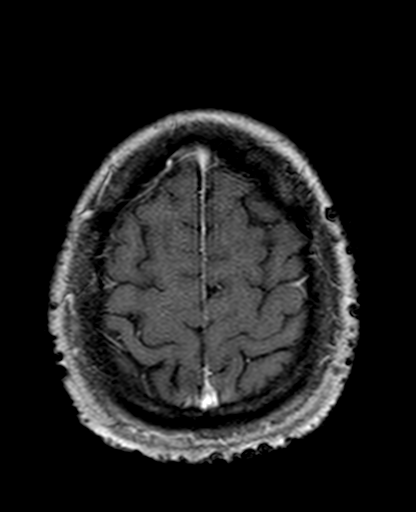
[im 60/60]
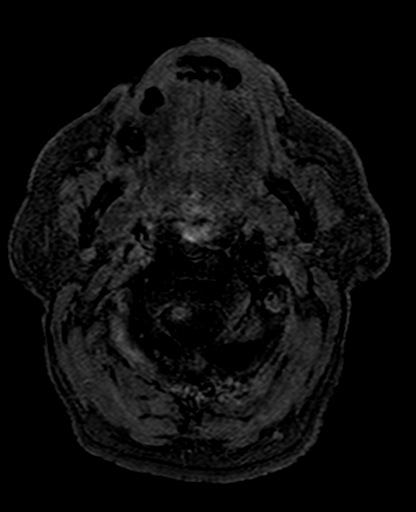

[Series 15: T1 · coronal · 3.0mm · 0.70mm/px · 2 of 13 slices shown (5 of 5)]
[im 1/13]
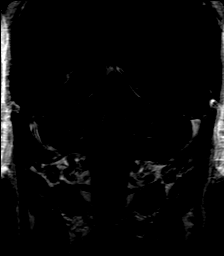
[im 13/13]
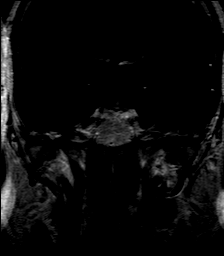

[38 of 48 positions shown; findings below may reference images not displayed]

FINDINGS: Cerebral volume is within normal limits for age. Ventricular
prominence. In no evidence of hyperdynamic flow at the cerebral
aqueduct. No evidence of transependymal edema. No restricted
diffusion or evidence of acute infarction. No midline shift or
supratentorial mass effect. Negative pituitary. Degenerative
ligamentous hypertrophy about the odontoid. Negative for age
cervicomedullary junction and visualized cervical spine. Major
intracranial vascular flow voids are within normal limits. No acute
intracranial hemorrhage identified.

Patchy mostly periventricular white matter T2 and FLAIR
hyperintensity, mild to moderate for age. No cortical
encephalomalacia. Deep gray matter nuclei within normal limits for
age. Outside of the right cerebellopontine angle, no abnormal
intracranial enhancement is identified. See below.

Mild to moderate paranasal sinus mucosal thickening. Visualized
orbit soft tissues are within normal limits. Visualized scalp soft
tissues are within normal limits. Normal bone marrow signal.

Dedicated internal auditory canal imaging. Soft tissue mass
extending from the right cerebellopontine angle into the right IAC
with heterogeneous T2 signal, homogeneous isointense FLAIR signal,
and fairly homogeneous robust postcontrast enhancement encompasses
15-16 mm diameter by 23 mm longitudinal (transverse). No significant
mass effect on the brainstem or cerebellum. There is expansion of
the right IAC. The lesion extends to the fundus of the right IAC,
but no abnormal enhancement of the cochlea or vestibular structures
is demonstrated. There is some loss of the normal T2 signal within
those structures. The right mastoids are clear. Normal right
stylomastoid foramen. Right seventh nerve enhancement appears within
normal limits.

This lesion was not included on the outside spine MRI.

The contralateral left cerebellopontine angle and seventh and eighth
cisternal and intra canalicular cranial nerves segments are normal.
Preserved T2 signal in the left cochlea and vestibular structures.
No abnormal enhancement on that side. Trace left mastoid fluid.
Negative left stylomastoid foramen.
IMPRESSION: 1. Right vestibular schwannoma encompasses 15-16 mm diameter by 23
mm in length extending from the right CPA to the fundus of the IAC.
Fairly homogeneous enhancement. No direct cochlea or vestibule
involvement suspected. No mass effect on the brainstem or
cerebellum.
2. Negative left IAC structures.
3. No acute intracranial abnormality. Mild to moderate for age
nonspecific cerebral white matter signal changes and
ventriculomegaly.
4. Mild paranasal sinus inflammation.

## 2016-05-20 DIAGNOSIS — C679 Malignant neoplasm of bladder, unspecified: Secondary | ICD-10-CM | POA: Diagnosis not present

## 2016-05-20 DIAGNOSIS — N400 Enlarged prostate without lower urinary tract symptoms: Secondary | ICD-10-CM | POA: Diagnosis not present

## 2016-05-20 DIAGNOSIS — Z6831 Body mass index (BMI) 31.0-31.9, adult: Secondary | ICD-10-CM | POA: Diagnosis not present

## 2016-05-20 DIAGNOSIS — I10 Essential (primary) hypertension: Secondary | ICD-10-CM | POA: Diagnosis not present

## 2016-05-25 DIAGNOSIS — H02201 Unspecified lagophthalmos right upper eyelid: Secondary | ICD-10-CM | POA: Diagnosis not present

## 2016-05-29 DIAGNOSIS — R350 Frequency of micturition: Secondary | ICD-10-CM | POA: Diagnosis not present

## 2016-05-29 DIAGNOSIS — Z125 Encounter for screening for malignant neoplasm of prostate: Secondary | ICD-10-CM | POA: Diagnosis not present

## 2016-05-29 DIAGNOSIS — C670 Malignant neoplasm of trigone of bladder: Secondary | ICD-10-CM | POA: Diagnosis not present

## 2016-05-29 DIAGNOSIS — N200 Calculus of kidney: Secondary | ICD-10-CM | POA: Diagnosis not present

## 2016-07-22 ENCOUNTER — Ambulatory Visit (INDEPENDENT_AMBULATORY_CARE_PROVIDER_SITE_OTHER): Payer: Medicare Other | Admitting: Urology

## 2016-07-22 ENCOUNTER — Encounter: Payer: Self-pay | Admitting: Urology

## 2016-07-22 VITALS — BP 158/64 | HR 85 | Ht 70.0 in | Wt 198.0 lb

## 2016-07-22 DIAGNOSIS — N400 Enlarged prostate without lower urinary tract symptoms: Secondary | ICD-10-CM | POA: Diagnosis not present

## 2016-07-22 DIAGNOSIS — Z8551 Personal history of malignant neoplasm of bladder: Secondary | ICD-10-CM

## 2016-07-22 LAB — BLADDER SCAN AMB NON-IMAGING: Scan Result: 160

## 2016-07-22 NOTE — Progress Notes (Signed)
07/22/2016 8:56 AM   Justin [name] 03/17/1941 [id number]  Referring provider: [name], PA-C [address] [place], [place] [address]  Chief Complaint  Patient presents with  . Benign Prostatic Hypertrophy    HPI: 75 year old male who returns today to discuss primarily his BPH symptoms  History of bladder cancer  dx bladder cancer in 07/2011, HgT1 and recurrent with LgTa 03/2013. TURBT in 07/2011 for a tumor on the right lateral wall which revealed HG T1. Records do not indicate repeat TUR or BCG  Recurrence of LG Ta in 03/2013 identified on quarterly surveillance cystoscopy above the right UO.   He was also managed by Dr. Harrison Mons upon moving to [facility]. Work up included IVP and RUS showing mild right hydroureteronephrosis and possible bilateral non obstructing stones. He also underwent cystoscopy in 01/2014 which showederythema on the right trigone and UO was difficult to identify. Urine cytology from that time was atypical. He elected to follow this lesion and cystoscopy on 05/04/14 showed progression of this lesions noting a 2x2 area in the midline of the bladder extending off to the right trigone with surrounding neovascularity concerning for CIS.   He was taken by myself to OR on 06/13/14 for left retrograde, bladder biopsy, and instillation of mitomycin. There was significant erythema around the right UO and trigone which was biopsied extensively. The right UO was identified but noted to have a small mucosal flap overlying consistent with previous resection of the UO and was unable to be cannulated for right RTG. Efflux of urine was seen from this ureter consent with patency. Pathology showed chronic inflammation but NO evidence of CIS. Urine cytology negative x 2. Follow up CT Urogram on 09/26/14 showed bilateral lower pole nonobstructing stones measuring 2 mm in the left and 3 on the right.  He was also noted to have very mild bilateral hydroureteronephrsois, right  greater than left.    He continued to have right trigonal patchy of erythema which was remained stable on serial cystoscopy and most recently progress across the trigone.  He returned to the OR on  03/11/16 for bladder biopsy and bilateral retrograde pyelogram.  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  Retrograde pyelogram bilaterally was normal.  He does have history of smoking, 2-3 ppd x 4-5 years, quit 40 years ago.    BPH with LUTS/ incomplete bladder emptying/ nocturia He also has a history of BPH finasteride started on Flomax 0.4 mg daily last visit (previously on terazosin but had to stop due to dizziness).    PVR today 160 cc, this is stable from previous.     Last visit, he was having severe urgency, frequency, weak stream, incomplete bladder emptying, and nocturia every one hour and 90. Since starting on the Flomax, his stream is improved and he is on getting up 1 or 2 times nightly to urinate. This is dramatically improved. In addition, he is only urinating about every 3 hours during the day. Occasionally, he does not feel that he completely empties his bladder.  No issues with recurrent urinary tract infections, bladder stones, or any other sequela from BPH. Overall today, he is pleased. Outlet surgery was briefly discussed in the past but he is not interested at this time.  PSA 0.6 on 7/17 with slightly irregular prostatic contour and rectal exam but no discrete nodules.  PMH: Past Medical History:  Diagnosis Date  . Arthritis   . Bell's  palsy   . BPH (benign prostatic hyperplasia)   . Cancer ([place])    bladder  . Deaf    right ear  . Dizziness   . History of urinary urgency   . Hydronephrosis, bilateral   . Hypertension   . Kidney stone   . Neuroma    acoustic  . 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 Urinary frequency   . Urinary tract infection     Surgical History: Past  Surgical History:  Procedure Laterality Date  . BACK SURGERY     cervical fusion  . BRAIN TUMOR EXCISION  2012   Radiation therapy  . CARPAL TUNNEL RELEASE Bilateral 01/25/2015   Procedure: CARPAL TUNNEL RELEASE;  Surgeon: Christophe Louis, MD;  Location: [facility] ORS;  Service: Orthopedics;  Laterality: Bilateral;  . CATARACT EXTRACTION W/PHACO Left 06/06/2015   Procedure: CATARACT EXTRACTION PHACO AND INTRAOCULAR LENS PLACEMENT (IOC);  Surgeon: Lyla Glassing, MD;  Location: [facility] ORS;  Service: Ophthalmology;  Laterality: Left;  [place]: 01:59.3 AP%: 13.7 CDE: 16.45 Lot # [id number]  . cervial fusion    . CYSTOSCOPY W/ RETROGRADES Bilateral 03/11/2016   Procedure: CYSTOSCOPY WITH RETROGRADE PYELOGRAM;  Surgeon: Hollice Espy, MD;  Location: [facility] ORS;  Service: Urology;  Laterality: Bilateral;  . CYSTOSCOPY WITH FULGERATION N/A 03/11/2016   Procedure: CYSTOSCOPY, BLADDER BIOPSY WITH FULGERATION;  Surgeon: Hollice Espy, MD;  Location: [facility] ORS;  Service: Urology;  Laterality: N/A;  . EYE SURGERY Left    Cataract extraction with IOL  . gamma knife     for acoustic neuroma  . Plate in head    . TRANSURETHRAL RESECTION OF BLADDER TUMOR WITH GYRUS (TURBT-GYRUS)      Home Medications:    Medication List       Accurate as of 07/22/16  8:56 AM. Always use your most recent med list.          finasteride 5 MG tablet Commonly known as:  PROSCAR Take 1 tablet (5 mg total) by mouth daily. pm   lisinopril 20 MG tablet Commonly known as:  PRINIVIL,ZESTRIL Take 20 mg by mouth at bedtime. pm   tamsulosin 0.4 MG Caps capsule Commonly known as:  FLOMAX Take 1 capsule (0.4 mg total) by mouth daily.   TH CRANBERRY CONCENTRATE PO Take 1 tablet by mouth daily.       Allergies:  Allergies  Allergen Reactions  . Oxycodone-Acetaminophen Rash  . Percocet [Oxycodone-Acetaminophen] Rash    Family History: Family History  Problem Relation Age of Onset  . Cancer Mother     Social History:   reports that he quit smoking about 37 years ago. His smoking use included Cigarettes. He smoked 2.00 packs per day. He has never used smokeless tobacco. He reports that he drinks alcohol. He reports that he does not use drugs.  ROS: UROLOGY Frequent Urination?: Yes Hard to postpone urination?: No Burning/pain with urination?: No Get up at night to urinate?: Yes Leakage of urine?: No Urine stream starts and stops?: No Trouble starting stream?: No Do you have to strain to urinate?: No Blood in urine?: No Urinary tract infection?: No Sexually transmitted disease?: No Injury to kidneys or bladder?: No Painful intercourse?: No Weak stream?: No Erection problems?: No Penile pain?: No  Gastrointestinal Nausea?: No Vomiting?: No Indigestion/heartburn?: No Diarrhea?: No Constipation?: No  Constitutional Fever: No Night sweats?: No Weight loss?: No Fatigue?: No  Skin Skin rash/lesions?: No Itching?: No  Eyes Blurred vision?: No Double vision?: No  Ears/Nose/Throat Sore throat?: No Sinus problems?: Yes  Hematologic/Lymphatic Swollen glands?: No Easy bruising?: No  Cardiovascular Leg swelling?: No Chest pain?: No  Respiratory Cough?: No Shortness of breath?: No  Endocrine Excessive thirst?: No  Musculoskeletal Back pain?: No Joint pain?: No  Neurological Headaches?: No Dizziness?: No  Psychologic Depression?: No Anxiety?: No  Physical Exam: BP (!) 158/64 (BP Location: Left Arm, Patient Position: Sitting, Cuff Size: Normal)   Pulse 85   Ht 5\' 10"  (1.778 m)   Wt 198 lb (89.8 kg)   BMI 28.41 kg/m   Constitutional:  Alert and oriented, No acute distress.   Wife today. HEENT: [place] AT, moist mucus membranes.  Trachea midline, no masses. Cardiovascular: No clubbing, cyanosis, or edema.   Respiratory: Normal respiratory effort, no increased work of breathing.   Skin: No rashes, bruises or suspicious lesions. Neurologic: Grossly intact, no focal  deficits, moving all 4 extremities. Psychiatric: Normal mood and affect.  Laboratory Data: Lab Results  Component Value Date   WBC 8.7 06/24/2014   HGB 13.7 06/24/2014   HCT 42.5 06/24/2014   MCV 96 06/24/2014   PLT 164 06/24/2014    Lab Results  Component Value Date   CREATININE 1.30 (H) 02/26/2016   Component     Latest Ref Rng & Units 03/27/2016  PSA     0.0 - 4.0 ng/mL 0.6   Results for orders placed or performed in visit on 07/22/16  Bladder Scan (Post Void Residual) in office  Result Value Ref Range   Scan Result 160     Assessment & Plan:    1. Benign prostatic hypertrophy (BPH) with incomplete bladder emptying Symptomatically improved with initiation of Flomax in addition to finasteride Continue these medications Continues to have elevated postvoid residual, today to 160 cc which is stable without sequela including no bladder stones or urinary tract infections   2. History of bladder cancer Status post bladder biopsy 2 for a suspicious patchy erythematous spot on the trigone. Each of these in the negative. His last documented recurrence was in 2014. As such, I recommended cystoscopy twice a year and then annually as per surveillance protocol.    Due for cysto in 09/2016   Hollice Espy, [place] [address], [place],  [address] [phone]

## 2016-09-11 ENCOUNTER — Encounter: Payer: Self-pay | Admitting: Urology

## 2016-09-11 ENCOUNTER — Ambulatory Visit (INDEPENDENT_AMBULATORY_CARE_PROVIDER_SITE_OTHER): Payer: Medicare Other | Admitting: Urology

## 2016-09-11 VITALS — BP 132/78 | HR 69 | Ht 70.0 in | Wt 202.8 lb

## 2016-09-11 DIAGNOSIS — N3090 Cystitis, unspecified without hematuria: Secondary | ICD-10-CM

## 2016-09-11 DIAGNOSIS — Z8603 Personal history of neoplasm of uncertain behavior: Secondary | ICD-10-CM

## 2016-09-11 DIAGNOSIS — Z87898 Personal history of other specified conditions: Secondary | ICD-10-CM | POA: Diagnosis not present

## 2016-09-11 LAB — URINALYSIS, COMPLETE
Bilirubin, UA: NEGATIVE
GLUCOSE, UA: NEGATIVE
Ketones, UA: NEGATIVE
NITRITE UA: POSITIVE — AB
PH UA: 5.5 (ref 5.0–7.5)
Protein, UA: NEGATIVE
Specific Gravity, UA: 1.02 (ref 1.005–1.030)
UUROB: 0.2 mg/dL (ref 0.2–1.0)

## 2016-09-11 LAB — MICROSCOPIC EXAMINATION

## 2016-09-11 NOTE — Progress Notes (Signed)
   09/11/16  CC:  Chief Complaint  Patient presents with  . Cysto    bladder cancer       HPI: 76 year old male with a history of bladder cancer who presents today for surveillance cystoscopy. Despite having no deviation versus baseline urinary symptoms, no gross hematuria, no dysuria, he has a grossly positive urinalysis today with 3+ leukocyte esterase and positive nitrites. As such, cystoscopy was deferred.  Blood pressure 132/78, pulse 69, height 5\' 10"  (1.778 m), weight 202 lb 12.8 oz (92 kg).  UA reviewed, see epic    Assessment/ Plan:  1. Cystitis-  Urinalysis today highly suspicious for bacterial cystitis. Urine culture ordered today, will treat as needed Reschedule cystoscopy for 2-3 weeks once infection has resolved Patient is asymptomatic, however, given his history of vague erythematous findings, went to eliminate confounding bladder erythema  Hollice Espy, MD

## 2016-09-14 ENCOUNTER — Telehealth: Payer: Self-pay

## 2016-09-14 DIAGNOSIS — N390 Urinary tract infection, site not specified: Secondary | ICD-10-CM

## 2016-09-14 LAB — CULTURE, URINE COMPREHENSIVE

## 2016-09-14 MED ORDER — SULFAMETHOXAZOLE-TRIMETHOPRIM 800-160 MG PO TABS: 1.0000 | ORAL_TABLET | Freq: Two times a day (BID) | ORAL | 0 refills | Status: AC

## 2016-09-14 NOTE — Telephone Encounter (Signed)
Spoke with pt in reference to +ucx. Made aware abx were sent to pharmacy. Pt voiced understanding.  

## 2016-09-14 NOTE — Telephone Encounter (Signed)
-----   Message from Hollice Espy, MD sent at 09/14/2016  7:50 AM EST ----- Please treat with Bactrim DS bid x 7 days.    Hollice Espy, MD

## 2016-09-30 ENCOUNTER — Ambulatory Visit (INDEPENDENT_AMBULATORY_CARE_PROVIDER_SITE_OTHER): Payer: Medicare Other | Admitting: Urology

## 2016-09-30 ENCOUNTER — Encounter: Payer: Self-pay | Admitting: Urology

## 2016-09-30 VITALS — BP 113/75 | HR 77 | Ht 70.0 in | Wt 195.0 lb

## 2016-09-30 DIAGNOSIS — Z08 Encounter for follow-up examination after completed treatment for malignant neoplasm: Secondary | ICD-10-CM

## 2016-09-30 DIAGNOSIS — N3090 Cystitis, unspecified without hematuria: Secondary | ICD-10-CM | POA: Diagnosis not present

## 2016-09-30 DIAGNOSIS — Z8603 Personal history of neoplasm of uncertain behavior: Secondary | ICD-10-CM

## 2016-09-30 DIAGNOSIS — R3914 Feeling of incomplete bladder emptying: Secondary | ICD-10-CM

## 2016-09-30 DIAGNOSIS — Z8551 Personal history of malignant neoplasm of bladder: Secondary | ICD-10-CM | POA: Diagnosis not present

## 2016-09-30 DIAGNOSIS — R319 Hematuria, unspecified: Secondary | ICD-10-CM | POA: Diagnosis not present

## 2016-09-30 DIAGNOSIS — Z87898 Personal history of other specified conditions: Secondary | ICD-10-CM | POA: Diagnosis not present

## 2016-09-30 DIAGNOSIS — N401 Enlarged prostate with lower urinary tract symptoms: Secondary | ICD-10-CM

## 2016-09-30 LAB — URINALYSIS, COMPLETE
BILIRUBIN UA: NEGATIVE
Glucose, UA: NEGATIVE
Ketones, UA: NEGATIVE
LEUKOCYTES UA: NEGATIVE
Nitrite, UA: NEGATIVE
PH UA: 6.5 (ref 5.0–7.5)
Protein, UA: NEGATIVE
Specific Gravity, UA: 1.015 (ref 1.005–1.030)
Urobilinogen, Ur: 0.2 mg/dL (ref 0.2–1.0)

## 2016-09-30 LAB — MICROSCOPIC EXAMINATION: BACTERIA UA: NONE SEEN

## 2016-09-30 MED ORDER — LIDOCAINE HCL 2 % EX GEL
1.0000 "application " | Freq: Once | CUTANEOUS | Status: AC
  Administered 2016-09-30: 1 via URETHRAL

## 2016-09-30 MED ORDER — CIPROFLOXACIN HCL 500 MG PO TABS
500.0000 mg | ORAL_TABLET | Freq: Once | ORAL | Status: AC
  Administered 2016-09-30: 500 mg via ORAL

## 2016-10-01 ENCOUNTER — Other Ambulatory Visit: Payer: Self-pay | Admitting: Urology

## 2016-10-01 NOTE — Progress Notes (Signed)
09/30/2016 9:31 AM   Justin [name] 08-Jul-1941 [id number]  Referring provider: [name], PA-C [address] [place], [place] [address]  Chief Complaint  Patient presents with  . Cysto    HPI: 76 year old male who returns today for cystoscopy.  History of bladder cancer  dx bladder cancer in 07/2011, HgT1 and recurrent with LgTa 03/2013. TURBT in 07/2011 for a tumor on the right lateral wall which revealed HG T1. Records do not indicate repeat TUR or BCG  Recurrence of LG Ta in 03/2013 identified on quarterly surveillance cystoscopy above the right UO.   He was also managed by Dr. Harrison Mons upon moving to [facility]. Work up included IVP and RUS showing mild right hydroureteronephrosis and possible bilateral non obstructing stones. He also underwent cystoscopy in 01/2014 which showederythema on the right trigone and UO was difficult to identify. Urine cytology from that time was atypical. He elected to follow this lesion and cystoscopy on 05/04/14 showed progression of this lesions noting a 2x2 area in the midline of the bladder extending off to the right trigone with surrounding neovascularity concerning for CIS.   He was taken by myself to OR on 06/13/14 for left retrograde, bladder biopsy, and instillation of mitomycin. There was significant erythema around the right UO and trigone which was biopsied extensively. The right UO was identified but noted to have a small mucosal flap overlying consistent with previous resection of the UO and was unable to be cannulated for right RTG. Efflux of urine was seen from this ureter consent with patency. Pathology showed chronic inflammation but NO evidence of CIS. Urine cytology negative x 2. Follow up CT Urogram on 09/26/14 showed bilateral lower pole nonobstructing stones measuring 2 mm in the left and 3 on the right.  He was also noted to have very mild bilateral hydroureteronephrsois, right greater than left.    He continued to have  right trigonal patchy of erythema which was remained stable on serial cystoscopy and most recently progress across the trigone.  He returned to the OR on  03/11/16 for bladder biopsy and bilateral retrograde pyelogram.  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  Retrograde pyelogram bilaterally was normal.  He does have history of smoking, 2-3 ppd x 4-5 years, quit 40 years ago.    BPH with LUTS/ incomplete bladder emptying/ nocturia He also has a history of BPH finasteride started on Flomax 0.4 mg daily (previously on terazosin but had to stop due to dizziness).    History of borderline PVR, last 160 cc.   Last visit, he was having severe urgency, frequency, weak stream, incomplete bladder emptying, and nocturia every one hour and 90. Since starting on the Flomax, his stream is improved and he is on getting up 1 or 2 times nightly to urinate. This is dramatically improved. In addition, he is only urinating about every 3 hours during the day. Occasionally, he does not feel that he completely empties his bladder.  No issues with recurrent urinary tract infections, bladder stones, or any other sequela from BPH. Overall, he is pleased.  PSA 0.6 on 7/17 with slightly irregular prostatic contour and rectal exam but no discrete nodules.  PMH: Past Medical History:  Diagnosis Date  . Arthritis   . Bell's palsy   . BPH (benign prostatic hyperplasia)   . Cancer ([place])    bladder  . Deaf    right ear  . Dizziness   .  History of urinary urgency   . Hydronephrosis, bilateral   . Hypertension   . Kidney stone   . Neuroma    acoustic  . 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 Urinary frequency   . Urinary tract infection     Surgical History: Past Surgical History:  Procedure Laterality Date  . BACK SURGERY     cervical fusion  . BRAIN TUMOR EXCISION  2012   Radiation therapy  . CARPAL TUNNEL RELEASE  Bilateral 01/25/2015   Procedure: CARPAL TUNNEL RELEASE;  Surgeon: Christophe Louis, MD;  Location: [facility] ORS;  Service: Orthopedics;  Laterality: Bilateral;  . CATARACT EXTRACTION W/PHACO Left 06/06/2015   Procedure: CATARACT EXTRACTION PHACO AND INTRAOCULAR LENS PLACEMENT (IOC);  Surgeon: Lyla Glassing, MD;  Location: [facility] ORS;  Service: Ophthalmology;  Laterality: Left;  [place]: 01:59.3 AP%: 13.7 CDE: 16.45 Lot # [id number]  . cervial fusion    . CYSTOSCOPY W/ RETROGRADES Bilateral 03/11/2016   Procedure: CYSTOSCOPY WITH RETROGRADE PYELOGRAM;  Surgeon: Hollice Espy, MD;  Location: [facility] OR[facility];  Service: Urology;  Laterality: Bilateral;  . CYSTOSCOPY WITH FULGERATION N/A 03/11/2016   Procedure: CYSTOSCOPY, BLADDER BIOPSY WITH FULGERATION;  Surgeon: Hollice Espy, MD;  Location: [facility] ORS;  Service: Urology;  Laterality: N/A;  . EYE SURGERY Left    Cataract extraction with IOL  . gamma knife     for acoustic neuroma  . Plate in head    . TRANSURETHRAL RESECTION OF BLADDER TUMOR WITH GYRUS (TURBT-GYRUS)      Home Medications:  Allergies as of 09/30/2016      Reactions   Oxycodone-acetaminophen Rash   Percocet [oxycodone-acetaminophen] Rash      Medication List       Accurate as of 09/30/16 11:59 PM. Always use your most recent med list.          finasteride 5 MG tablet Commonly known as:  PROSCAR Take 1 tablet (5 mg total) by mouth daily. pm   lisinopril 20 MG tablet Commonly known as:  PRINIVIL,ZESTRIL Take 20 mg by mouth at bedtime. pm   RAPAFLO 8 MG Caps capsule Generic drug:  silodosin   TH CRANBERRY CONCENTRATE PO Take 1 tablet by mouth daily.       Allergies:  Allergies  Allergen Reactions  . Oxycodone-Acetaminophen Rash  . Percocet [Oxycodone-Acetaminophen] Rash    Family History: Family History  Problem Relation Age of Onset  . Cancer Mother     Social History:  reports that he quit smoking about 37 years ago. His smoking use included Cigarettes. He smoked  2.00 packs per day. He has never used smokeless tobacco. He reports that he drinks alcohol. He reports that he does not use drugs.   Physical Exam: BP 113/75   Pulse 77   Ht 5\' 10"  (1.778 m)   Wt 195 lb (88.5 kg)   BMI 27.98 kg/m   Constitutional:  Alert and oriented, No acute distress.  Accompanied today by wife. Wife today. HEENT: [place] AT, moist mucus membranes.  Trachea midline, no masses. Cardiovascular: No clubbing, cyanosis, or edema.   Respiratory: Normal respiratory effort, no increased work of breathing.   GU: Circumcised phallus, orthotopic patent meatus. Skin: No rashes, bruises or suspicious lesions. Neurologic: Grossly intact, no focal deficits, moving all 4 extremities. Psychiatric: Normal mood and affect.  Laboratory Data: Lab Results  Component Value Date   WBC 8.7 06/24/2014   HGB 13.7 06/24/2014   HCT 42.5 06/24/2014   MCV 96 06/24/2014  PLT 164 06/24/2014    Lab Results  Component Value Date   CREATININE 1.30 (H) 02/26/2016   Component     Latest Ref Rng & Units 03/27/2016  PSA     0.0 - 4.0 ng/mL 0.6     Cystoscopy Procedure Note  Patient identification was confirmed, informed consent was obtained, and patient was prepped using Betadine solution.  Lidocaine jelly was administered per urethral meatus.    Preoperative abx where received prior to procedure.     Pre-Procedure: - Inspection reveals a normal caliber ureteral meatus.  Procedure: The flexible cystoscope was introduced without difficulty - No urethral strictures/lesions are present. - Enlarged prostate with bilobar coaptaiton - Normal bladder neck - Bilateral ureteral orifices identified - Bladder mucosa reveals stable patchy erythema on the right UO, delicate scar at this location. There is a small erythematous patch in the mid trigone and a erythematous patch just be on the left UO which was previously biopsied. This is stable overall. - No bladder stones - No  trabeculation  Retroflexion uremarkable.   Post-Procedure: - Patient tolerated the procedure well  Assessment/ Plan:  1. History of bladder cancer Status post bladder biopsy 2 for a suspicious patchy erythematous spot on the trigone. Each of these in the negative. His last documented recurrence was in 2014.  Cysto today with continued suspicious erythema just beyond each UO bilaterally, chronic and stable previous  Urine cytology today again Given abnormal finding, recommend continuing cystoscopy every 6 months for now to assess for progression of these 2 spots, biopsy-proven negative yet still somewhat concerning.   2. Benign prostatic hypertrophy (BPH) with incomplete bladder emptying Symptomatically improved with initiation of Flomax in addition to finasteride History of elevated PVR, last 160 cc Offered TURP in place, declined  Return in about 6 months (around 03/30/2017) for cystoscopy .   Hollice Espy, MD  [facility] [address] [place], [place] [address] [phone]

## 2016-10-02 ENCOUNTER — Telehealth: Payer: Self-pay

## 2016-10-02 DIAGNOSIS — N401 Enlarged prostate with lower urinary tract symptoms: Secondary | ICD-10-CM

## 2016-10-02 NOTE — Telephone Encounter (Signed)
Pt called stating he is unable to afford the rapaflo. We do not have any samples in the office. Pt stated that he is aware Dr. Erlene Quan will be out of the office until next week therefore he will take flomax until he hears otherwise.

## 2016-10-03 NOTE — Telephone Encounter (Signed)
It actually unclear to me why the patient is on Rapaflo. Per my documentation, he was on Flomax. Can you please clarify with this with the patient. We did not discuss BPH therapy at his last appointment, he underwent cystoscopy only.  Flomax is much more affordable.    Hollice Espy, MD

## 2016-10-05 ENCOUNTER — Telehealth: Payer: Self-pay | Admitting: Family Medicine

## 2016-10-05 ENCOUNTER — Other Ambulatory Visit: Payer: Self-pay | Admitting: Urology

## 2016-10-05 NOTE — Telephone Encounter (Signed)
Spoke with pt in reference to not sending a ucx but did send urine for cytology. Made pt aware will receive a phone call when cytology results are back. Pt voiced understanding.

## 2016-10-05 NOTE — Telephone Encounter (Signed)
Patient states he was under the impression he was to receive a call for Urine Culture results. I do not see this in the computer. Please Advise.

## 2016-10-05 NOTE — Telephone Encounter (Signed)
We did not send a urine culture. There was no evidence of infection. UCx was not discussed. We did however send a urine cytology. That is still pending. I will contact him if it is abnormal once he returns.  Hollice Espy, MD

## 2016-10-05 NOTE — Telephone Encounter (Signed)
Patient called and has confirmed he is wanting refills of Flomax. He states the RX has timed out at the pharmacy.

## 2016-10-08 MED ORDER — TAMSULOSIN HCL 0.4 MG PO CAPS
0.4000 mg | ORAL_CAPSULE | Freq: Every day | ORAL | 3 refills | Status: AC
Start: 2016-10-08 — End: ?

## 2016-10-08 NOTE — Telephone Encounter (Signed)
Spoke with pt in reference to rapaflo and flomax. Pt stated that rapaflo cost to much therefore he wants to go back to tamsulosin. A rx was sent of tamsulosin for pt. Pt voiced understanding.

## 2016-11-18 DIAGNOSIS — I10 Essential (primary) hypertension: Secondary | ICD-10-CM | POA: Diagnosis not present

## 2016-11-18 DIAGNOSIS — Z9181 History of falling: Secondary | ICD-10-CM | POA: Diagnosis not present

## 2016-11-18 DIAGNOSIS — Z6832 Body mass index (BMI) 32.0-32.9, adult: Secondary | ICD-10-CM | POA: Diagnosis not present

## 2016-11-18 DIAGNOSIS — Z0000 Encounter for general adult medical examination without abnormal findings: Secondary | ICD-10-CM | POA: Diagnosis not present

## 2016-11-18 DIAGNOSIS — N400 Enlarged prostate without lower urinary tract symptoms: Secondary | ICD-10-CM | POA: Diagnosis not present

## 2016-11-18 DIAGNOSIS — Z1389 Encounter for screening for other disorder: Secondary | ICD-10-CM | POA: Diagnosis not present

## 2016-11-18 DIAGNOSIS — C679 Malignant neoplasm of bladder, unspecified: Secondary | ICD-10-CM | POA: Diagnosis not present

## 2017-02-03 DIAGNOSIS — R21 Rash and other nonspecific skin eruption: Secondary | ICD-10-CM | POA: Diagnosis not present

## 2017-02-03 DIAGNOSIS — Z6832 Body mass index (BMI) 32.0-32.9, adult: Secondary | ICD-10-CM | POA: Diagnosis not present

## 2017-02-03 DIAGNOSIS — C679 Malignant neoplasm of bladder, unspecified: Secondary | ICD-10-CM | POA: Diagnosis not present

## 2017-03-31 ENCOUNTER — Telehealth: Payer: Self-pay | Admitting: Urology

## 2017-03-31 ENCOUNTER — Encounter: Payer: Self-pay | Admitting: Urology

## 2017-03-31 ENCOUNTER — Other Ambulatory Visit: Payer: Self-pay | Admitting: Urology

## 2017-03-31 NOTE — Telephone Encounter (Signed)
Patient was then no show today for cystoscopy.  Please call him personally and reschedule this cystoscopy.    Hollice Espy, MD

## 2017-04-01 NOTE — Telephone Encounter (Signed)
Patient is refusing to come back and due to billing issues.  Our staff has worked to remedy this situation the patient is still refusing further treatment at our office.    Hollice Espy, MD

## 2017-04-01 NOTE — Telephone Encounter (Signed)
Patient said he called and cancelled this app please see me about this and I will explain.   Justin [name]

## 2017-04-12 DIAGNOSIS — C670 Malignant neoplasm of trigone of bladder: Secondary | ICD-10-CM | POA: Diagnosis not present

## 2017-04-12 DIAGNOSIS — R350 Frequency of micturition: Secondary | ICD-10-CM | POA: Diagnosis not present

## 2017-04-12 DIAGNOSIS — N200 Calculus of kidney: Secondary | ICD-10-CM | POA: Diagnosis not present

## 2017-05-25 DIAGNOSIS — H2511 Age-related nuclear cataract, right eye: Secondary | ICD-10-CM | POA: Diagnosis not present

## 2017-05-26 DIAGNOSIS — N400 Enlarged prostate without lower urinary tract symptoms: Secondary | ICD-10-CM | POA: Diagnosis not present

## 2017-05-26 DIAGNOSIS — M79675 Pain in left toe(s): Secondary | ICD-10-CM | POA: Diagnosis not present

## 2017-05-26 DIAGNOSIS — Z6832 Body mass index (BMI) 32.0-32.9, adult: Secondary | ICD-10-CM | POA: Diagnosis not present

## 2017-05-26 DIAGNOSIS — B351 Tinea unguium: Secondary | ICD-10-CM | POA: Diagnosis not present

## 2017-05-26 DIAGNOSIS — I10 Essential (primary) hypertension: Secondary | ICD-10-CM | POA: Diagnosis not present

## 2017-06-21 DIAGNOSIS — H9311 Tinnitus, right ear: Secondary | ICD-10-CM | POA: Diagnosis not present

## 2017-06-21 DIAGNOSIS — H9041 Sensorineural hearing loss, unilateral, right ear, with unrestricted hearing on the contralateral side: Secondary | ICD-10-CM | POA: Diagnosis not present

## 2017-06-28 DIAGNOSIS — N390 Urinary tract infection, site not specified: Secondary | ICD-10-CM | POA: Diagnosis not present

## 2017-06-28 DIAGNOSIS — N289 Disorder of kidney and ureter, unspecified: Secondary | ICD-10-CM | POA: Diagnosis not present

## 2017-06-28 DIAGNOSIS — N400 Enlarged prostate without lower urinary tract symptoms: Secondary | ICD-10-CM | POA: Diagnosis not present

## 2017-10-25 DIAGNOSIS — H2511 Age-related nuclear cataract, right eye: Secondary | ICD-10-CM | POA: Diagnosis not present

## 2017-11-25 DIAGNOSIS — Z6832 Body mass index (BMI) 32.0-32.9, adult: Secondary | ICD-10-CM | POA: Diagnosis not present

## 2017-11-25 DIAGNOSIS — C679 Malignant neoplasm of bladder, unspecified: Secondary | ICD-10-CM | POA: Diagnosis not present

## 2017-11-25 DIAGNOSIS — N400 Enlarged prostate without lower urinary tract symptoms: Secondary | ICD-10-CM | POA: Diagnosis not present

## 2017-11-25 DIAGNOSIS — L820 Inflamed seborrheic keratosis: Secondary | ICD-10-CM | POA: Diagnosis not present

## 2017-11-25 DIAGNOSIS — E669 Obesity, unspecified: Secondary | ICD-10-CM | POA: Diagnosis not present

## 2017-11-25 DIAGNOSIS — Z0000 Encounter for general adult medical examination without abnormal findings: Secondary | ICD-10-CM | POA: Diagnosis not present

## 2017-11-25 DIAGNOSIS — I10 Essential (primary) hypertension: Secondary | ICD-10-CM | POA: Diagnosis not present

## 2017-11-25 DIAGNOSIS — H16139 Photokeratitis, unspecified eye: Secondary | ICD-10-CM | POA: Diagnosis not present

## 2017-11-25 DIAGNOSIS — Z125 Encounter for screening for malignant neoplasm of prostate: Secondary | ICD-10-CM | POA: Diagnosis not present

## 2017-12-08 DIAGNOSIS — L918 Other hypertrophic disorders of the skin: Secondary | ICD-10-CM | POA: Diagnosis not present

## 2017-12-08 DIAGNOSIS — Z9181 History of falling: Secondary | ICD-10-CM | POA: Diagnosis not present

## 2017-12-08 DIAGNOSIS — Z1331 Encounter for screening for depression: Secondary | ICD-10-CM | POA: Diagnosis not present

## 2017-12-08 DIAGNOSIS — L820 Inflamed seborrheic keratosis: Secondary | ICD-10-CM | POA: Diagnosis not present

## 2018-01-10 DIAGNOSIS — H2511 Age-related nuclear cataract, right eye: Secondary | ICD-10-CM | POA: Diagnosis not present

## 2018-01-12 ENCOUNTER — Encounter: Payer: Self-pay | Admitting: *Deleted

## 2018-01-20 ENCOUNTER — Ambulatory Visit: Payer: Medicare Other | Admitting: Registered Nurse

## 2018-01-20 ENCOUNTER — Encounter
Admission: RE | Disposition: A | Discharge status: Home / Self Care | Payer: Self-pay | Source: Ambulatory Visit | Attending: Ophthalmology

## 2018-01-20 ENCOUNTER — Other Ambulatory Visit: Payer: Self-pay

## 2018-01-20 ENCOUNTER — Ambulatory Visit
Admission: RE | Admit: 2018-01-20 | Discharge: 2018-01-20 | Disposition: A | Discharge status: Home / Self Care | Payer: Medicare Other | Source: Ambulatory Visit | Attending: Ophthalmology | Admitting: Ophthalmology

## 2018-01-20 ENCOUNTER — Encounter: Payer: Self-pay | Admitting: *Deleted

## 2018-01-20 DIAGNOSIS — H2511 Age-related nuclear cataract, right eye: Secondary | ICD-10-CM | POA: Insufficient documentation

## 2018-01-20 DIAGNOSIS — I10 Essential (primary) hypertension: Secondary | ICD-10-CM | POA: Diagnosis not present

## 2018-01-20 DIAGNOSIS — K219 Gastro-esophageal reflux disease without esophagitis: Secondary | ICD-10-CM | POA: Insufficient documentation

## 2018-01-20 DIAGNOSIS — N400 Enlarged prostate without lower urinary tract symptoms: Secondary | ICD-10-CM | POA: Insufficient documentation

## 2018-01-20 DIAGNOSIS — Z79899 Other long term (current) drug therapy: Secondary | ICD-10-CM | POA: Diagnosis not present

## 2018-01-20 DIAGNOSIS — Z87891 Personal history of nicotine dependence: Secondary | ICD-10-CM | POA: Diagnosis not present

## 2018-01-20 DIAGNOSIS — G4730 Sleep apnea, unspecified: Secondary | ICD-10-CM | POA: Insufficient documentation

## 2018-01-20 DIAGNOSIS — M1990 Unspecified osteoarthritis, unspecified site: Secondary | ICD-10-CM | POA: Diagnosis not present

## 2018-01-20 HISTORY — PX: CATARACT EXTRACTION W/PHACO: SHX586

## 2018-01-20 SURGERY — PHACOEMULSIFICATION, CATARACT, WITH IOL INSERTION
Anesthesia: Monitor Anesthesia Care | Site: Eye | Laterality: Right | Wound class: Clean

## 2018-01-20 MED ORDER — EPINEPHRINE PF 1 MG/ML IJ SOLN
INTRAOCULAR | Status: DC | PRN
  Administered 2018-01-20: 09:00:00 via OPHTHALMIC

## 2018-01-20 MED ORDER — FENTANYL CITRATE (PF) 100 MCG/2ML IJ SOLN
INTRAMUSCULAR | Status: AC
  Filled 2018-01-20: qty 2

## 2018-01-20 MED ORDER — FENTANYL CITRATE (PF) 100 MCG/2ML IJ SOLN
INTRAMUSCULAR | Status: DC | PRN
  Administered 2018-01-20: 50 ug via INTRAVENOUS

## 2018-01-20 MED ORDER — MIDAZOLAM HCL 2 MG/2ML IJ SOLN
INTRAMUSCULAR | Status: DC | PRN
  Administered 2018-01-20: 1 mg via INTRAVENOUS

## 2018-01-20 MED ORDER — TETRACAINE HCL 0.5 % OP SOLN
OPHTHALMIC | Status: DC | PRN
  Administered 2018-01-20: 2 [drp] via OPHTHALMIC

## 2018-01-20 MED ORDER — SODIUM HYALURONATE 10 MG/ML IO SOLN
INTRAOCULAR | Status: DC | PRN
  Administered 2018-01-20: 0.55 mL via INTRAOCULAR

## 2018-01-20 MED ORDER — POVIDONE-IODINE 5 % OP SOLN
OPHTHALMIC | Status: AC
  Filled 2018-01-20: qty 30

## 2018-01-20 MED ORDER — SODIUM HYALURONATE 23 MG/ML IO SOLN
INTRAOCULAR | Status: AC
  Filled 2018-01-20: qty 0.6

## 2018-01-20 MED ORDER — SODIUM CHLORIDE 0.9 % IV SOLN
INTRAVENOUS | Status: DC
  Administered 2018-01-20: 07:00:00 via INTRAVENOUS

## 2018-01-20 MED ORDER — MIDAZOLAM HCL 2 MG/2ML IJ SOLN
INTRAMUSCULAR | Status: AC
  Filled 2018-01-20: qty 2

## 2018-01-20 MED ORDER — MOXIFLOXACIN HCL 0.5 % OP SOLN
OPHTHALMIC | Status: AC
  Filled 2018-01-20: qty 3

## 2018-01-20 MED ORDER — EPINEPHRINE PF 1 MG/ML IJ SOLN
INTRAMUSCULAR | Status: AC
  Filled 2018-01-20: qty 2

## 2018-01-20 MED ORDER — ARMC OPHTHALMIC DILATING DROPS
OPHTHALMIC | Status: AC
  Administered 2018-01-20: 1 via OPHTHALMIC
  Filled 2018-01-20: qty 0.4

## 2018-01-20 MED ORDER — POVIDONE-IODINE 5 % OP SOLN
OPHTHALMIC | Status: DC | PRN
  Administered 2018-01-20: 1 via OPHTHALMIC

## 2018-01-20 MED ORDER — LIDOCAINE HCL (PF) 4 % IJ SOLN
INTRAMUSCULAR | Status: AC
  Filled 2018-01-20: qty 5

## 2018-01-20 MED ORDER — SODIUM HYALURONATE 23 MG/ML IO SOLN
INTRAOCULAR | Status: DC | PRN
  Administered 2018-01-20: 0.6 mL via INTRAOCULAR

## 2018-01-20 MED ORDER — MOXIFLOXACIN HCL 0.5 % OP SOLN: 1.0000 [drp] | OPHTHALMIC | Status: DC | PRN

## 2018-01-20 MED ORDER — ONDANSETRON HCL 4 MG/2ML IJ SOLN
INTRAMUSCULAR | Status: DC | PRN
  Administered 2018-01-20: 4 mg via INTRAVENOUS

## 2018-01-20 MED ORDER — ARMC OPHTHALMIC DILATING DROPS
1.0000 | OPHTHALMIC | Status: AC
Start: 2018-01-20 — End: 2018-01-20
  Administered 2018-01-20 (×3): 1 via OPHTHALMIC

## 2018-01-20 MED ORDER — LIDOCAINE HCL (PF) 4 % IJ SOLN
INTRAOCULAR | Status: DC | PRN
  Administered 2018-01-20: 4 mL via OPHTHALMIC

## 2018-01-20 SURGICAL SUPPLY — 16 items
DISSECTOR HYDRO NUCLEUS 50X22 (MISCELLANEOUS) ×3 IMPLANT
GLOVE BIO SURGEON STRL SZ8 (GLOVE) ×3 IMPLANT
GLOVE BIOGEL M 6.5 STRL (GLOVE) ×3 IMPLANT
GLOVE SURG LX 7.5 STRW (GLOVE) ×2
GLOVE SURG LX STRL 7.5 STRW (GLOVE) ×1 IMPLANT
GOWN STRL REUS W/ TWL LRG LVL3 (GOWN DISPOSABLE) ×2 IMPLANT
GOWN STRL REUS W/TWL LRG LVL3 (GOWN DISPOSABLE) ×4
LABEL CATARACT MEDS ST (LABEL) ×3 IMPLANT
LENS IOL ACRYSOF IQ 20.5 (Intraocular Lens) ×3 IMPLANT
PACK CATARACT (MISCELLANEOUS) ×3 IMPLANT
PACK CATARACT KING (MISCELLANEOUS) ×3 IMPLANT
PACK EYE AFTER SURG (MISCELLANEOUS) ×3 IMPLANT
SOL BSS BAG (MISCELLANEOUS) ×3
SOLUTION BSS BAG (MISCELLANEOUS) ×1 IMPLANT
WATER STERILE IRR 250ML POUR (IV SOLUTION) ×3 IMPLANT
WIPE NON LINTING 3.25X3.25 (MISCELLANEOUS) ×3 IMPLANT

## 2018-01-20 NOTE — Discharge Instructions (Signed)
Eye Surgery Discharge Instructions  Expect mild scratchy sensation or mild soreness. DO NOT RUB YOUR EYE!  The day of surgery:  Minimal physical activity, but bed rest is not required  No reading, computer work, or close hand work  No bending, lifting, or straining.  May watch TV  For 24 hours:  No driving, legal decisions, or alcoholic beverages  Safety precautions  Eat anything you prefer: It is better to start with liquids, then soup then solid foods.  _____ Eye patch should be worn until postoperative exam tomorrow.  ____ Solar shield eyeglasses should be worn for comfort in the sunlight/patch while sleeping  Resume all regular medications including aspirin or Coumadin if these were discontinued prior to surgery. You may shower, bathe, shave, or wash your hair. Tylenol may be taken for mild discomfort.  Call your doctor if you experience significant pain, nausea, or vomiting, fever > 101 or other signs of infection. [phone] or [phone] Specific instructions:  Follow-up Information    Eulogio Bear, MD Follow up.   Specialty:  Ophthalmology Why:  May 17 at 10:30am Contact information: [address] [place] [address] [phone]          Eye Surgery Discharge Instructions  Expect mild scratchy sensation or mild soreness. DO NOT RUB YOUR EYE!  The day of surgery:  Minimal physical activity, but bed rest is not required  No reading, computer work, or close hand work  No bending, lifting, or straining.  May watch TV  For 24 hours:  No driving, legal decisions, or alcoholic beverages  Safety precautions  Eat anything you prefer: It is better to start with liquids, then soup then solid foods.  _____ Eye patch should be worn until postoperative exam tomorrow.  ____ Solar shield eyeglasses should be worn for comfort in the sunlight/patch while sleeping  Resume all regular medications including aspirin or Coumadin if  these were discontinued prior to surgery. You may shower, bathe, shave, or wash your hair. Tylenol may be taken for mild discomfort.  Call your doctor if you experience significant pain, nausea, or vomiting, fever > 101 or other signs of infection. [phone] or [phone] Specific instructions:  Follow-up Information    Eulogio Bear, MD Follow up.   Specialty:  Ophthalmology Why:  May 17 at 10:30am Contact information: [place] [address] [phone]

## 2018-01-20 NOTE — OR Nursing (Signed)
Discharge instructions discussed with pt and wife. Both voice understanding. 

## 2018-01-20 NOTE — Anesthesia Post-op Follow-up Note (Signed)
Anesthesia QCDR form completed.        

## 2018-01-20 NOTE — Anesthesia Postprocedure Evaluation (Signed)
Anesthesia Post Note  Patient: Justin [name]  Procedure(s) Performed: CATARACT EXTRACTION PHACO AND INTRAOCULAR LENS PLACEMENT (IOC) (Right Eye)  Patient location during evaluation: PACU Anesthesia Type: MAC Level of consciousness: awake and alert Pain management: pain level controlled Vital Signs Assessment: post-procedure vital signs reviewed and stable Respiratory status: spontaneous breathing, nonlabored ventilation, respiratory function stable and patient connected to nasal cannula oxygen Cardiovascular status: stable and blood pressure returned to baseline Postop Assessment: no apparent nausea or vomiting Anesthetic complications: no     Last Vitals:  Vitals:   01/20/18 0710 01/20/18 0941  BP: 138/88 (!) 129/57  Pulse: 63 65  Resp: 16 16  Temp: 36.7 C 37.2 C  SpO2: 98% 96%    Last Pain:  Vitals:   01/20/18 0941  TempSrc: Oral  PainSc: 0-No pain                 Alison Stalling

## 2018-01-20 NOTE — Transfer of Care (Signed)
Immediate Anesthesia Transfer of Care Note  Patient: Justin [name]  Procedure(s) Performed: Procedure(s) with comments: CATARACT EXTRACTION PHACO AND INTRAOCULAR LENS PLACEMENT (IOC) (Right) - [place] 00:24.8 AP% 6.0 CDE 1.47 FLUID PACK LOT # [id number]  Patient Location: PHASE II  Anesthesia Type:MAC  Level of Consciousness: Awake, Alert, Oriented  Airway & Oxygen Therapy: Patient Spontanous Breathing and Patient on room air   Post-op Assessment: Report given to RN and Post -op Vital signs reviewed and stable  Post vital signs: Reviewed and stable  Last Vitals:  Vitals:   01/20/18 0710 01/20/18 0941  BP: 138/88 (!) 129/57  Pulse: 63 65  Resp: 16 16  Temp: 36.7 C 37.2 C  SpO2: 57% 84%    Complications: No apparent anesthesia complications

## 2018-01-20 NOTE — Op Note (Signed)
OPERATIVE NOTE  Justin [name] [id number] 01/20/2018   PREOPERATIVE DIAGNOSIS:  Nuclear sclerotic cataract right eye.  H25.11   POSTOPERATIVE DIAGNOSIS:    Nuclear sclerotic cataract right eye.     PROCEDURE:  Phacoemusification with posterior chamber intraocular lens placement of the right eye   LENS:   Implant Name Type Inv. Item Serial No. Manufacturer Lot No. LRB No. Used  LENS IOL ACRYSOF IQ 20.5 - [id number] Intraocular Lens LENS IOL ACRYSOF IQ 20.5 [id number] ALCON  Right 1       AU00T0 +20.5   ULTRASOUND TIME: 0 minutes 24 seconds.  CDE 1.47   SURGEON:  [name], MD, MPH  ANESTHESIOLOGIST: Anesthesiologist: Martha Clan, MD CRNA: Doreen Salvage, CRNA; Allean Found, CRNA   ANESTHESIA:  Topical with tetracaine drops augmented with 1% preservative-free intracameral lidocaine.  ESTIMATED BLOOD LOSS: less than 1 mL.   COMPLICATIONS:  None.   DESCRIPTION OF PROCEDURE:  The patient was identified in the holding room and transported to the operating room and placed in the supine position under the operating microscope.  The right eye was identified as the operative eye and it was prepped and draped in the usual sterile ophthalmic fashion.   A 1.0 millimeter clear-corneal paracentesis was made at the 10:30 position. 0.5 ml of preservative-free 1% lidocaine with epinephrine was injected into the anterior chamber.  The anterior chamber was filled with Healon 5 viscoelastic.  A 2.4 millimeter keratome was used to make a near-clear corneal incision at the 8:00 position.  A curvilinear capsulorrhexis was made with a cystotome and capsulorrhexis forceps.  Balanced salt solution was used to hydrodissect and hydrodelineate the nucleus.   Phacoemulsification was then used in stop and chop fashion to remove the lens nucleus and epinucleus.  The remaining cortex was then removed using the irrigation and aspiration handpiece. Healon was then placed into the capsular bag to distend  it for lens placement.  A lens was then injected into the capsular bag.  The remaining viscoelastic was aspirated.   Wounds were hydrated with balanced salt solution.  The anterior chamber was inflated to a physiologic pressure with balanced salt solution.   Intracameral vigamox 0.1 mL undiluted was injected into the eye and a drop placed onto the ocular surface.  No wound leaks were noted.  The patient was taken to the recovery room in stable condition without complications of anesthesia or surgery  [name] 01/20/2018, 9:39 AM

## 2018-01-20 NOTE — Anesthesia Procedure Notes (Signed)
Date/Time: 01/20/2018 9:12 AM Performed by: Doreen Salvage, CRNA Pre-anesthesia Checklist: Patient identified, Emergency Drugs available, Suction available and Patient being monitored Patient Re-evaluated:Patient Re-evaluated prior to induction Oxygen Delivery Method: Nasal cannula Induction Type: IV induction Dental Injury: Teeth and Oropharynx as per pre-operative assessment  Comments: Nasal cannula with etCO2 monitoring

## 2018-01-20 NOTE — Anesthesia Preprocedure Evaluation (Signed)
Anesthesia Evaluation  Patient identified by MRN, date of birth, ID band Patient awake    Reviewed: Allergy & Precautions, H&P , NPO status , Patient's Chart, lab work & pertinent test results  History of Anesthesia Complications Negative for: history of anesthetic complications  Airway Mallampati: III  TM Distance: >3 FB Neck ROM: limited    Dental  (+) Poor Dentition, Chipped   Pulmonary neg shortness of breath, sleep apnea , former smoker,    Pulmonary exam normal breath sounds clear to auscultation       Cardiovascular Exercise Tolerance: Good hypertension, (-) Past MI Normal cardiovascular exam Rhythm:regular Rate:Normal     Neuro/Psych  Neuromuscular disease (Left sided Bell's palsy) negative psych ROS   GI/Hepatic Neg liver ROS, neg GERD  ,  Endo/Other  negative endocrine ROS  Renal/GU Renal disease  negative genitourinary   Musculoskeletal  (+) Arthritis ,   Abdominal   Peds  Hematology negative hematology ROS (+)   Anesthesia Other Findings Past Medical History:   BPH (benign prostatic hyperplasia)                           Palsy                                                          Comment:chronic facial nerve   Hypertension                                                 Bell's palsy                                                 Cancer                                                         Comment:bladder   Urinary frequency                                            Urinary tract infection                                      History of urinary urgency                                   Kidney stone  Hydronephrosis, bilateral                                    Deaf                                                           Comment:right ear   Neuroma                                                        Comment:acoustic   Dizziness                                                     Arthritis                                                    Reproductive/Obstetrics negative OB ROS                             Anesthesia Physical  Anesthesia Plan  ASA: III  Anesthesia Plan: MAC   Post-op Pain Management:    Induction: Intravenous  PONV Risk Score and Plan:   Airway Management Planned: Natural Airway and Nasal Cannula  Additional Equipment:   Intra-op Plan:   Post-operative Plan:   Informed Consent: I have reviewed the patients History and Physical, chart, labs and discussed the procedure including the risks, benefits and alternatives for the proposed anesthesia with the patient or authorized representative who has indicated his/her understanding and acceptance.   Dental Advisory Given  Plan Discussed with: Anesthesiologist, CRNA and Surgeon  Anesthesia Plan Comments:         Anesthesia Quick Evaluation

## 2018-01-20 NOTE — H&P (Signed)
The History and Physical notes are on paper, have been signed, and are to be scanned.   I have examined the patient and there are no changes to the H&P.   [name] 01/20/2018 9:05 AM

## 2018-02-02 DIAGNOSIS — Z68.41 Body mass index (BMI) pediatric, 5th percentile to less than 85th percentile for age: Secondary | ICD-10-CM | POA: Diagnosis not present

## 2018-02-02 DIAGNOSIS — M25532 Pain in left wrist: Secondary | ICD-10-CM | POA: Diagnosis not present

## 2018-02-11 DIAGNOSIS — M654 Radial styloid tenosynovitis [de Quervain]: Secondary | ICD-10-CM | POA: Diagnosis not present

## 2018-02-11 DIAGNOSIS — G5602 Carpal tunnel syndrome, left upper limb: Secondary | ICD-10-CM | POA: Diagnosis not present

## 2018-02-15 ENCOUNTER — Other Ambulatory Visit: Payer: Self-pay | Admitting: Neurosurgery

## 2018-02-15 DIAGNOSIS — D333 Benign neoplasm of cranial nerves: Secondary | ICD-10-CM

## 2018-02-17 ENCOUNTER — Other Ambulatory Visit: Payer: Self-pay | Admitting: Neurosurgery

## 2018-02-17 DIAGNOSIS — D333 Benign neoplasm of cranial nerves: Secondary | ICD-10-CM

## 2018-02-21 DIAGNOSIS — D333 Benign neoplasm of cranial nerves: Secondary | ICD-10-CM | POA: Diagnosis not present

## 2018-02-22 DIAGNOSIS — Z6831 Body mass index (BMI) 31.0-31.9, adult: Secondary | ICD-10-CM | POA: Diagnosis not present

## 2018-02-22 DIAGNOSIS — J209 Acute bronchitis, unspecified: Secondary | ICD-10-CM | POA: Diagnosis not present

## 2018-02-22 DIAGNOSIS — J069 Acute upper respiratory infection, unspecified: Secondary | ICD-10-CM | POA: Diagnosis not present

## 2018-03-02 ENCOUNTER — Ambulatory Visit
Admission: RE | Admit: 2018-03-02 | Discharge: 2018-03-02 | Disposition: A | Discharge status: Home / Self Care | Payer: Medicare Other | Source: Ambulatory Visit | Attending: Neurosurgery | Admitting: Neurosurgery

## 2018-03-02 DIAGNOSIS — G319 Degenerative disease of nervous system, unspecified: Secondary | ICD-10-CM | POA: Diagnosis not present

## 2018-03-02 DIAGNOSIS — H933X1 Disorders of right acoustic nerve: Secondary | ICD-10-CM | POA: Diagnosis not present

## 2018-03-02 DIAGNOSIS — D333 Benign neoplasm of cranial nerves: Secondary | ICD-10-CM | POA: Insufficient documentation

## 2018-03-02 MED ORDER — GADOBENATE DIMEGLUMINE 529 MG/ML IV SOLN
20.0000 mL | Freq: Once | INTRAVENOUS | Status: AC | PRN
  Administered 2018-03-02: 19 mL via INTRAVENOUS

## 2018-03-09 DIAGNOSIS — D333 Benign neoplasm of cranial nerves: Secondary | ICD-10-CM | POA: Diagnosis not present

## 2018-04-05 ENCOUNTER — Emergency Department
Admission: EM | Admit: 2018-04-05 | Discharge: 2018-04-06 | Disposition: A | Discharge status: Home / Self Care | Payer: Medicare Other | Attending: Emergency Medicine | Admitting: Emergency Medicine

## 2018-04-05 ENCOUNTER — Other Ambulatory Visit: Payer: Self-pay

## 2018-04-05 ENCOUNTER — Encounter: Payer: Self-pay | Admitting: Emergency Medicine

## 2018-04-05 DIAGNOSIS — Z87442 Personal history of urinary calculi: Secondary | ICD-10-CM | POA: Insufficient documentation

## 2018-04-05 DIAGNOSIS — Z87891 Personal history of nicotine dependence: Secondary | ICD-10-CM | POA: Insufficient documentation

## 2018-04-05 DIAGNOSIS — Z79899 Other long term (current) drug therapy: Secondary | ICD-10-CM | POA: Diagnosis not present

## 2018-04-05 DIAGNOSIS — R300 Dysuria: Secondary | ICD-10-CM | POA: Insufficient documentation

## 2018-04-05 DIAGNOSIS — Z87440 Personal history of urinary (tract) infections: Secondary | ICD-10-CM | POA: Diagnosis not present

## 2018-04-05 DIAGNOSIS — I10 Essential (primary) hypertension: Secondary | ICD-10-CM | POA: Diagnosis not present

## 2018-04-05 DIAGNOSIS — Z8551 Personal history of malignant neoplasm of bladder: Secondary | ICD-10-CM | POA: Insufficient documentation

## 2018-04-05 DIAGNOSIS — R319 Hematuria, unspecified: Secondary | ICD-10-CM

## 2018-04-05 LAB — URINALYSIS, COMPLETE (UACMP) WITH MICROSCOPIC
BILIRUBIN URINE: NEGATIVE
Glucose, UA: NEGATIVE mg/dL
Ketones, ur: NEGATIVE mg/dL
Nitrite: NEGATIVE
PROTEIN: 100 mg/dL — AB
RBC / HPF: >50 RBC/hpf — ABNORMAL HIGH (ref 0–5)
Specific Gravity, Urine: 1.02 (ref 1.005–1.030)
WBC, UA: >50 WBC/hpf — ABNORMAL HIGH (ref 0–5)
pH: 6 (ref 5.0–8.0)

## 2018-04-05 NOTE — ED Triage Notes (Signed)
Patient ambulatory to triage with steady gait, without difficulty or distress noted; pt c/o hematuria and penile pain tonight; hx bladder CA

## 2018-04-05 NOTE — ED Notes (Addendum)
Resumed care from christina P. rn.  Pt alert.  Pt waiting to see er md  ua to lab

## 2018-04-05 NOTE — ED Provider Notes (Signed)
[facility] Emergency Department Provider Note  ____________________________________________   First MD Initiated Contact with Patient 04/05/18 2327     (approximate)  I have reviewed the triage vital signs and the nursing notes.   HISTORY  Chief Complaint Hematuria    HPI Justin [name] is a 77 y.o. male whose medical history includes bladder cancer and who goes to [facility] in [place] for his urological care.  He presents for acute onset hematuria tonight with some associated dysuria.  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  He reports that tonight he went to urinate and it was red although he is urinated several times since then including in the emergency department and has become only a light pink.  No blood clots are visible.  There is a mild burning dysuria when he goes but otherwise he has no pain.  He denies fever/chills, chest pain, shortness of breath, nausea, vomiting, and abdominal pain.  He has an appointment with his urologist in less than 2 weeks in [place] but he called tonight to tell them about the blood and he was told to come immediately to the emergency department for evaluation.  He is in no distress at this time.  Past Medical History:  Diagnosis Date  . Arthritis   . Bell's palsy   . Bell's palsy   . BPH (benign prostatic hyperplasia)   . Cancer ([place])    bladder  . Deaf    right ear  . Dizziness   . History of urinary urgency   . Hydronephrosis, bilateral   . Hypertension   . Kidney stone   . Neuroma    acoustic  . 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 Urinary frequency   . Urinary tract infection     Patient Active Problem List   Diagnosis Date Noted  . 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 Essential hypertension 07/15/2015  . Personal history of other benign  neoplasm 07/15/2015  . Lesion of bladder 05/04/2014  . History of neoplasm of bladder 01/10/2014  . Incomplete bladder emptying 01/10/2014  . Lower urinary tract symptoms 01/10/2014  . Infection of urinary tract 01/10/2014    Past Surgical History:  Procedure Laterality Date  . BACK SURGERY     cervical fusion  . BRAIN TUMOR EXCISION  2012   Radiation therapy  . CARPAL TUNNEL RELEASE Bilateral 01/25/2015   Procedure: CARPAL TUNNEL RELEASE;  Surgeon: Christophe Louis, MD;  Location: [facility] ORS;  Service: Orthopedics;  Laterality: Bilateral;  . CATARACT EXTRACTION W/PHACO Left 06/06/2015   Procedure: CATARACT EXTRACTION PHACO AND INTRAOCULAR LENS PLACEMENT (IOC);  Surgeon: Lyla Glassing, MD;  Location: [facility] ORS;  Service: Ophthalmology;  Laterality: Left;  [place]: 01:59.3 AP%: 13.7 CDE: 16.45 Lot # [id number]  . CATARACT EXTRACTION W/PHACO Right 01/20/2018   Procedure: CATARACT EXTRACTION PHACO AND INTRAOCULAR LENS PLACEMENT (IOC);  Surgeon: Eulogio Bear, MD;  Location: [facility] ORS;  Service: Ophthalmology;  Laterality: Right;  [place] 00:24.8 AP% 6.0 CDE 1.47 FLUID PACK LOT # [id number]  . cervial fusion    . CYSTOSCOPY W/ RETROGRADES Bilateral 03/11/2016   Procedure: CYSTOSCOPY WITH RETROGRADE PYELOGRAM;  Surgeon: Hollice Espy, MD;  Location: [facility] ORS;  Service: Urology;  Laterality: Bilateral;  . CYSTOSCOPY WITH FULGERATION N/A 03/11/2016   Procedure: CYSTOSCOPY, BLADDER BIOPSY WITH FULGERATION;  Surgeon: Hollice Espy, MD;  Location: [facility] ORS;  Service: Urology;  Laterality: N/A;  . EYE SURGERY Left    Cataract extraction with IOL  . gamma knife     for acoustic neuroma  . Plate in head    . TRANSURETHRAL RESECTION OF BLADDER TUMOR WITH GYRUS (TURBT-GYRUS)      Prior to Admission medications   Medication Sig Start Date End Date Taking? Authorizing Provider  acetaminophen (TYLENOL) 500 MG tablet Take 1,000 mg by mouth daily as needed for moderate pain.    [provider]    Cranberry-Vitamin C-Vitamin E (TH CRANBERRY CONCENTRATE PO) Take 2 tablets by mouth at bedtime.     [provider]  finasteride (PROSCAR) 5 MG tablet Take 1 tablet (5 mg total) by mouth daily. pm Patient taking differently: Take 5 mg by mouth daily.  10/28/15   Hollice Espy, MD  lidocaine (LMX) 4 % cream Apply 1 application topically 3 (three) times daily as needed (wrist pain).    [provider]  lisinopril (PRINIVIL,ZESTRIL) 20 MG tablet Take 20 mg by mouth at bedtime.     [provider]  Polyethyl Glycol-Propyl Glycol (SYSTANE ULTRA OP) Place 1 drop into the right eye daily as needed (lubrication).    [provider]  tamsulosin (FLOMAX) 0.4 MG CAPS capsule Take 1 capsule (0.4 mg total) by mouth daily. Patient not taking: Reported on 01/20/2018 10/08/16   Hollice Espy, MD    Allergies Oxycodone-acetaminophen  Family History  Problem Relation Age of Onset  . Cancer Mother     Social History Social History   Tobacco Use  . Smoking status: Former Smoker    Packs/day: 2.00    Types: Cigarettes    Last attempt to quit: 01/17/1979    Years since quitting: 39.2  . Smokeless tobacco: Never Used  Substance Use Topics  . Alcohol use: Yes    Comment: wine occ  . Drug use: No    Review of Systems Constitutional: No fever/chills Eyes: No visual changes. ENT: No sore throat. Cardiovascular: Denies chest pain. Respiratory: Denies shortness of breath. Gastrointestinal: No abdominal pain.  No nausea, no vomiting.  No diarrhea.  No constipation. Genitourinary: Acute onset hematuria and mild burning dysuria, now improving with each successive urination Musculoskeletal: Negative for neck pain.  Negative for back pain. Integumentary: Negative for rash. Neurological: Negative for headaches, focal weakness or numbness.   ____________________________________________   PHYSICAL EXAM:  VITAL SIGNS: ED Triage Vitals  Enc Vitals Group     BP  04/05/18 2240 (!) 177/91     Pulse Rate 04/05/18 2240 86     Resp 04/05/18 2240 20     Temp 04/05/18 2240 98.6 F (37 C)     Temp Source 04/05/18 2240 Oral     SpO2 04/05/18 2240 96 %     Weight 04/05/18 2241 90.7 kg (200 lb)     Height 04/05/18 2241 1.778 m (5\' 10" )     Head Circumference --      Peak Flow --      Pain Score 04/05/18 2241 2     Pain Loc --      Pain Edu? --      Excl. in [place]? --     Constitutional: Alert and oriented. Well appearing and in no acute distress. Eyes: Conjunctivae are normal.  Chronic right-sided eye droop as a result of Bell's palsy Head: Atraumatic. Nose: No congestion/rhinnorhea. Mouth/Throat: Mucous membranes are moist. Neck: No stridor.  No meningeal signs.   Cardiovascular: Normal rate, regular rhythm. Good peripheral circulation. Grossly normal heart sounds. Respiratory: Normal respiratory effort.  No retractions. Lungs CTAB. Gastrointestinal: Soft and nontender. No distention.  Musculoskeletal: No lower extremity tenderness nor edema. No gross deformities of extremities. Neurologic:  Normal speech and language.  Chronic right-sided facial droop secondary to Bell's palsy, but no focal neurological deficits. Skin:  Skin is warm, dry and intact. No rash noted. Psychiatric: Mood and affect are normal. Speech and behavior are normal.  ____________________________________________   LABS (all labs ordered are listed, but only abnormal results are displayed)  Labs Reviewed  URINALYSIS, COMPLETE (UACMP) WITH MICROSCOPIC - Abnormal; Notable for the following components:      Result Value   Color, Urine YELLOW (*)    APPearance CLOUDY (*)    Hgb urine dipstick LARGE (*)    Protein, ur 100 (*)    Leukocytes, UA MODERATE (*)    RBC / HPF >50 (*)    WBC, UA >50 (*)    Bacteria, UA RARE (*)    All other components within normal limits  BASIC METABOLIC PANEL - Abnormal; Notable for the following components:   Glucose, Bld 114 (*)    BUN 30 (*)      Creatinine, Ser 1.40 (*)    Calcium 8.5 (*)    GFR calc non Af Amer 47 (*)    GFR calc Af Amer 54 (*)    All other components within normal limits  CBC - Abnormal; Notable for the following components:   RBC 4.39 (*)    All other components within normal limits  URINE CULTURE   ____________________________________________  EKG  None - EKG not ordered by ED physician ____________________________________________  RADIOLOGY   ED MD interpretation: No indication for imaging  Official radiology report(s): No results found.  ____________________________________________   PROCEDURES  Critical Care performed: No   Procedure(s) performed:   Procedures   ____________________________________________   INITIAL IMPRESSION / ASSESSMENT AND PLAN / ED COURSE  As part of my medical decision making, I reviewed the following data within the [place] History obtained from family, Nursing notes reviewed and incorporated, Labs reviewed , Old chart reviewed a    Differential diagnosis includes, but is not limited to, recurrence of bladder cancer, UTI, ureteral stone, traumatic hematuria, intrarenal issues such as glomerulonephritis.  I think it is most likely that the patient has some unresolved neoplastic issue that has caused the recurrence.  Given that he has been working outside recently, even though he says he has been trying to stay hydrated, I will check basic lab work occluding a BMP and CBC for baseline labs to compare against his last labs in the system from 2017.  His creatinine looks like it has varied between 1.3 and 1.7.  He is in no distress with normal vital signs, no tachycardia, urinating without any difficulty, and tolerating oral fluids without any difficulty.  We had an extensive discussion of the hematuria and I explained that he will need to follow-up with his urologist at the next available opportunity, but there is no evidence of a urinary  tract infection at this time and a urine culture is pending, but no additional treatment is indicated in the emergency department assuming relatively normal and stable lab work.  He understands and agrees with the plan.  Clinical Course as of Apr 06 58  Wed Apr 06, 2018  0039 Hemoglobin: 14.3 [CF]  0055 Creatinine(!): 1.40 [  CF]  [phone] Discussed the results with the patient, he and his wife are very comfortable with the plan for discharge and outpatient follow-up.  I gave my usual and customary return precautions.   [CF]    Clinical Course User Index [CF] Hinda Kehr, MD    ____________________________________________  FINAL CLINICAL IMPRESSION(S) / ED DIAGNOSES  Final diagnoses:  Hematuria, unspecified type     MEDICATIONS GIVEN DURING THIS VISIT:  Medications - No data to display   ED Discharge Orders    None       Note:  This document was prepared using Dragon voice recognition software and may include unintentional dictation errors.    Hinda Kehr, MD 04/06/18 [phone]

## 2018-04-06 DIAGNOSIS — R319 Hematuria, unspecified: Secondary | ICD-10-CM | POA: Diagnosis not present

## 2018-04-06 LAB — BASIC METABOLIC PANEL
ANION GAP: 5 (ref 5–15)
BUN: 30 mg/dL — ABNORMAL HIGH (ref 8–23)
CHLORIDE: 109 mmol/L (ref 98–111)
CO2: 27 mmol/L (ref 22–32)
Calcium: 8.5 mg/dL — ABNORMAL LOW (ref 8.9–10.3)
Creatinine, Ser: 1.4 mg/dL — ABNORMAL HIGH (ref 0.61–1.24)
GFR, EST AFRICAN AMERICAN: 54 mL/min — AB (ref >60)
GFR, EST NON AFRICAN AMERICAN: 47 mL/min — AB (ref >60)
Glucose, Bld: 114 mg/dL — ABNORMAL HIGH (ref 70–99)
POTASSIUM: 3.9 mmol/L (ref 3.5–5.1)
Sodium: 141 mmol/L (ref 135–145)

## 2018-04-06 LAB — CBC
HEMATOCRIT: 41.4 % (ref 40.0–52.0)
HEMOGLOBIN: 14.3 g/dL (ref 13.0–18.0)
MCH: 32.5 pg (ref 26.0–34.0)
MCHC: 34.5 g/dL (ref 32.0–36.0)
MCV: 94.2 fL (ref 80.0–100.0)
Platelets: 165 10*3/uL (ref 150–440)
RBC: 4.39 MIL/uL — AB (ref 4.40–5.90)
RDW: 13.2 % (ref 11.5–14.5)
WBC: 9.6 10*3/uL (ref 3.8–10.6)

## 2018-04-06 NOTE — Discharge Instructions (Addendum)
As we discussed, your work-up today was reassuring, but given your history of bladder cancer, it is important that you follow-up with your urologist in [place] at the next available opportunity.  Please let him know that a urine culture is pending and that your CBC and basic metabolic panel were within normal limits and that your creatinine was 1.4 which is stable for you.    Return to the emergency department if you develop new or worsening symptoms that concern you.

## 2018-04-06 NOTE — ED Notes (Signed)
ED Provider at bedside. 

## 2018-04-08 LAB — URINE CULTURE
Culture: >=100000 — AB
Special Requests: NORMAL

## 2018-04-09 NOTE — Progress Notes (Signed)
ED culture report from visit on 04/06/18 urine culture now resulted (04/09/18) with >100 K staphylococcus species (coagulase negative) resistant to oxacillin. Patient was here on 04/06/18 for hematuria which was felt to likely be secondary to recurrence of bladder cancer vs kidney stone vs UTI. Spoke with EDP Dr. Burlene Arnt who recommends starting Bactrim DS 1 tablet po BID x 5 days. Called and left message for patient to call back.   Teryn Gust A. [place], [place].D., BCPS Clinical Pharmacist 04/09/2018 15:01

## 2018-04-25 DIAGNOSIS — C670 Malignant neoplasm of trigone of bladder: Secondary | ICD-10-CM | POA: Diagnosis not present

## 2018-04-25 DIAGNOSIS — N200 Calculus of kidney: Secondary | ICD-10-CM | POA: Diagnosis not present

## 2018-04-25 DIAGNOSIS — R350 Frequency of micturition: Secondary | ICD-10-CM | POA: Diagnosis not present

## 2018-05-14 ENCOUNTER — Inpatient Hospital Stay
Admission: EM | Admit: 2018-05-14 | Discharge: 2018-05-16 | DRG: 247 | Disposition: A | Discharge status: Home / Self Care | Payer: Medicare Other | Attending: Internal Medicine | Admitting: Internal Medicine

## 2018-05-14 ENCOUNTER — Encounter
Admission: EM | Disposition: A | Discharge status: Home / Self Care | Payer: Self-pay | Source: Home / Self Care | Attending: Internal Medicine

## 2018-05-14 ENCOUNTER — Encounter: Payer: Self-pay | Admitting: *Deleted

## 2018-05-14 ENCOUNTER — Emergency Department: Payer: Medicare Other

## 2018-05-14 ENCOUNTER — Other Ambulatory Visit: Payer: Self-pay

## 2018-05-14 DIAGNOSIS — I2109 ST elevation (STEMI) myocardial infarction involving other coronary artery of anterior wall: Secondary | ICD-10-CM | POA: Diagnosis not present

## 2018-05-14 DIAGNOSIS — R079 Chest pain, unspecified: Secondary | ICD-10-CM | POA: Diagnosis not present

## 2018-05-14 DIAGNOSIS — Z923 Personal history of irradiation: Secondary | ICD-10-CM

## 2018-05-14 DIAGNOSIS — H9190 Unspecified hearing loss, unspecified ear: Secondary | ICD-10-CM | POA: Diagnosis not present

## 2018-05-14 DIAGNOSIS — I2510 Atherosclerotic heart disease of native coronary artery without angina pectoris: Secondary | ICD-10-CM | POA: Diagnosis present

## 2018-05-14 DIAGNOSIS — E119 Type 2 diabetes mellitus without complications: Secondary | ICD-10-CM | POA: Diagnosis present

## 2018-05-14 DIAGNOSIS — N401 Enlarged prostate with lower urinary tract symptoms: Secondary | ICD-10-CM | POA: Diagnosis present

## 2018-05-14 DIAGNOSIS — I213 ST elevation (STEMI) myocardial infarction of unspecified site: Secondary | ICD-10-CM | POA: Diagnosis present

## 2018-05-14 DIAGNOSIS — Z87891 Personal history of nicotine dependence: Secondary | ICD-10-CM

## 2018-05-14 DIAGNOSIS — Z885 Allergy status to narcotic agent status: Secondary | ICD-10-CM

## 2018-05-14 DIAGNOSIS — Z9842 Cataract extraction status, left eye: Secondary | ICD-10-CM

## 2018-05-14 DIAGNOSIS — I2102 ST elevation (STEMI) myocardial infarction involving left anterior descending coronary artery: Secondary | ICD-10-CM | POA: Diagnosis not present

## 2018-05-14 DIAGNOSIS — I10 Essential (primary) hypertension: Secondary | ICD-10-CM | POA: Diagnosis not present

## 2018-05-14 DIAGNOSIS — E785 Hyperlipidemia, unspecified: Secondary | ICD-10-CM | POA: Diagnosis present

## 2018-05-14 DIAGNOSIS — Z886 Allergy status to analgesic agent status: Secondary | ICD-10-CM

## 2018-05-14 DIAGNOSIS — Z9841 Cataract extraction status, right eye: Secondary | ICD-10-CM

## 2018-05-14 DIAGNOSIS — Z961 Presence of intraocular lens: Secondary | ICD-10-CM | POA: Diagnosis present

## 2018-05-14 DIAGNOSIS — Z87442 Personal history of urinary calculi: Secondary | ICD-10-CM

## 2018-05-14 DIAGNOSIS — Z981 Arthrodesis status: Secondary | ICD-10-CM

## 2018-05-14 DIAGNOSIS — Z8551 Personal history of malignant neoplasm of bladder: Secondary | ICD-10-CM

## 2018-05-14 HISTORY — PX: LEFT HEART CATH AND CORONARY ANGIOGRAPHY: CATH118249

## 2018-05-14 HISTORY — PX: CORONARY/GRAFT ACUTE MI REVASCULARIZATION: CATH118305

## 2018-05-14 LAB — CBC
HCT: 46.7 % (ref 40.0–52.0)
Hemoglobin: 15.9 g/dL (ref 13.0–18.0)
MCH: 31.9 pg (ref 26.0–34.0)
MCHC: 34 g/dL (ref 32.0–36.0)
MCV: 94 fL (ref 80.0–100.0)
PLATELETS: 181 10*3/uL (ref 150–440)
RBC: 4.97 MIL/uL (ref 4.40–5.90)
RDW: 13.1 % (ref 11.5–14.5)
WBC: 14.8 10*3/uL — ABNORMAL HIGH (ref 3.8–10.6)

## 2018-05-14 LAB — COMPREHENSIVE METABOLIC PANEL
ALBUMIN: 4.3 g/dL (ref 3.5–5.0)
ALK PHOS: 59 U/L (ref 38–126)
ALT: 21 U/L (ref 0–44)
AST: 32 U/L (ref 15–41)
Anion gap: 11 (ref 5–15)
BUN: 28 mg/dL — ABNORMAL HIGH (ref 8–23)
CALCIUM: 9.1 mg/dL (ref 8.9–10.3)
CO2: 25 mmol/L (ref 22–32)
CREATININE: 1.64 mg/dL — AB (ref 0.61–1.24)
Chloride: 103 mmol/L (ref 98–111)
GFR calc Af Amer: 45 mL/min — ABNORMAL LOW (ref >60)
GFR calc non Af Amer: 39 mL/min — ABNORMAL LOW (ref >60)
GLUCOSE: 162 mg/dL — AB (ref 70–99)
Potassium: 3.7 mmol/L (ref 3.5–5.1)
SODIUM: 139 mmol/L (ref 135–145)
Total Bilirubin: 0.8 mg/dL (ref 0.3–1.2)
Total Protein: 7.6 g/dL (ref 6.5–8.1)

## 2018-05-14 LAB — PROTIME-INR
INR: 0.97
Prothrombin Time: 12.8 seconds (ref 11.4–15.2)

## 2018-05-14 LAB — TROPONIN I: TROPONIN I: 0.05 ng/mL — AB (ref <0.03)

## 2018-05-14 LAB — APTT: aPTT: 26 seconds (ref 24–36)

## 2018-05-14 SURGERY — CORONARY/GRAFT ACUTE MI REVASCULARIZATION
Anesthesia: Moderate Sedation

## 2018-05-14 MED ORDER — ASPIRIN 81 MG PO CHEW
324.0000 mg | CHEWABLE_TABLET | Freq: Once | ORAL | Status: AC
  Administered 2018-05-14: 324 mg via ORAL

## 2018-05-14 MED ORDER — HEPARIN SODIUM (PORCINE) 5000 UNIT/ML IJ SOLN: 60.0000 [IU]/kg | Freq: Once | INTRAMUSCULAR | Status: DC

## 2018-05-14 MED ORDER — TIROFIBAN (AGGRASTAT) BOLUS VIA INFUSION
INTRAVENOUS | Status: DC | PRN
  Administered 2018-05-14: 2235 ug via INTRAVENOUS

## 2018-05-14 MED ORDER — SODIUM CHLORIDE 0.9 % IV SOLN: INTRAVENOUS | Status: DC

## 2018-05-14 MED ORDER — HEPARIN SODIUM (PORCINE) 5000 UNIT/ML IJ SOLN
4000.0000 [IU] | Freq: Once | INTRAMUSCULAR | Status: AC
  Administered 2018-05-14: 4000 [IU] via INTRAVENOUS

## 2018-05-14 MED ORDER — TIROFIBAN HCL IV 12.5 MG/250 ML
INTRAVENOUS | Status: AC | PRN
  Administered 2018-05-14: 0.075 ug/kg/min via INTRAVENOUS

## 2018-05-14 MED ORDER — BIVALIRUDIN TRIFLUOROACETATE 250 MG IV SOLR
INTRAVENOUS | Status: AC
  Filled 2018-05-14: qty 250

## 2018-05-14 MED ORDER — FENTANYL CITRATE (PF) 100 MCG/2ML IJ SOLN
INTRAMUSCULAR | Status: AC
  Filled 2018-05-14: qty 2

## 2018-05-14 MED ORDER — TIROFIBAN HCL IV 12.5 MG/250 ML
INTRAVENOUS | Status: AC
  Filled 2018-05-14: qty 250

## 2018-05-14 MED ORDER — SODIUM CHLORIDE 0.9 % IV SOLN
INTRAVENOUS | Status: DC
  Administered 2018-05-14: 1 mL via INTRAVENOUS

## 2018-05-14 MED ORDER — BIVALIRUDIN BOLUS VIA INFUSION - CUPID
INTRAVENOUS | Status: DC | PRN
  Administered 2018-05-14: 67.05 mg via INTRAVENOUS

## 2018-05-14 MED ORDER — TICAGRELOR 90 MG PO TABS
ORAL_TABLET | ORAL | Status: AC
  Filled 2018-05-14: qty 2

## 2018-05-14 MED ORDER — FENTANYL CITRATE (PF) 100 MCG/2ML IJ SOLN
25.0000 ug | Freq: Once | INTRAMUSCULAR | Status: AC
  Administered 2018-05-14: 25 ug via INTRAVENOUS

## 2018-05-14 MED ORDER — FENTANYL CITRATE (PF) 100 MCG/2ML IJ SOLN
INTRAMUSCULAR | Status: DC | PRN
  Administered 2018-05-14: 25 ug via INTRAVENOUS

## 2018-05-14 MED ORDER — SODIUM CHLORIDE 0.9 % IV SOLN
INTRAVENOUS | Status: AC | PRN
  Administered 2018-05-14: 1.75 mg/kg/h via INTRAVENOUS

## 2018-05-14 MED ORDER — MIDAZOLAM HCL 2 MG/2ML IJ SOLN
INTRAMUSCULAR | Status: AC
  Filled 2018-05-14: qty 2

## 2018-05-14 MED ORDER — TICAGRELOR 90 MG PO TABS
ORAL_TABLET | ORAL | Status: DC | PRN
  Administered 2018-05-14: 180 mg via ORAL

## 2018-05-14 SURGICAL SUPPLY — 16 items
BALLN TREK RX 2.5X15 (BALLOONS) ×3
BALLN ~~LOC~~ TREK RX 3.5X20 (BALLOONS) ×3
BALLOON TREK RX 2.5X15 (BALLOONS) ×1 IMPLANT
BALLOON ~~LOC~~ TREK RX 3.5X20 (BALLOONS) ×1 IMPLANT
CATH INFINITI 5FR ANG PIGTAIL (CATHETERS) ×3 IMPLANT
CATH INFINITI JR4 5F (CATHETERS) ×3 IMPLANT
CATH VISTA GUIDE 6FR XB3.5 (CATHETERS) ×3 IMPLANT
DEVICE CLOSURE MYNXGRIP 6/7F (Vascular Products) ×3 IMPLANT
DEVICE INFLAT 30 PLUS (MISCELLANEOUS) ×3 IMPLANT
KIT MANI 3VAL PERCEP (MISCELLANEOUS) ×3 IMPLANT
NEEDLE PERC 18GX7CM (NEEDLE) ×3 IMPLANT
PACK CARDIAC CATH (CUSTOM PROCEDURE TRAY) ×3 IMPLANT
SHEATH AVANTI 6FR X 11CM (SHEATH) ×3 IMPLANT
STENT SIERRA 3.00 X 23 MM (Permanent Stent) ×3 IMPLANT
WIRE ASAHI PROWATER 180CM (WIRE) ×3 IMPLANT
WIRE GUIDERIGHT .035X150 (WIRE) ×3 IMPLANT

## 2018-05-14 NOTE — ED Provider Notes (Signed)
[facility] Emergency Department Provider Note   ____________________________________________    I have reviewed the triage vital signs and the nursing notes.   HISTORY  Chief Complaint Chest Pain     HPI Justin [name] is a 77 y.o. male who presents with chest pain.  Patient denies a history of coronary artery disease, he does report a long history of smoking.  He states that central severe chest pressure started about 1.5 hours ago, initially thought was indigestion but then he became quite sweaty.  No shortness of breath.  No vomiting.  Has not taken anything for this  Past Medical History:  Diagnosis Date  . Arthritis   . Bell's palsy   . Bell's palsy   . BPH (benign prostatic hyperplasia)   . Cancer ([place])    bladder  . Deaf    right ear  . Dizziness   . History of urinary urgency   . Hydronephrosis, bilateral   . Hypertension   . Kidney stone   . Neuroma    acoustic  . 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 Urinary frequency   . Urinary tract infection     Patient Active Problem List   Diagnosis Date Noted  . 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 Essential hypertension 07/15/2015  . Personal history of other benign neoplasm 07/15/2015  . Lesion of bladder 05/04/2014  . History of neoplasm of bladder 01/10/2014  . Incomplete bladder emptying 01/10/2014  . Lower urinary tract symptoms 01/10/2014  . Infection of urinary tract 01/10/2014    Past Surgical History:  Procedure Laterality Date  . BACK SURGERY     cervical fusion  . BRAIN TUMOR EXCISION  2012   Radiation therapy  . CARPAL TUNNEL RELEASE Bilateral 01/25/2015   Procedure: CARPAL TUNNEL RELEASE;  Surgeon: Christophe Louis, MD;  Location: [facility] ORS;  Service: Orthopedics;  Laterality: Bilateral;  . CATARACT EXTRACTION W/PHACO Left 06/06/2015   Procedure: CATARACT EXTRACTION PHACO AND INTRAOCULAR LENS PLACEMENT (IOC);  Surgeon: Lyla Glassing, MD;  Location: [facility]  ORS;  Service: Ophthalmology;  Laterality: Left;  [place]: 01:59.3 AP%: 13.7 CDE: 16.45 Lot # [id number]  . CATARACT EXTRACTION W/PHACO Right 01/20/2018   Procedure: CATARACT EXTRACTION PHACO AND INTRAOCULAR LENS PLACEMENT (IOC);  Surgeon: Eulogio Bear, MD;  Location: [facility] ORS;  Service: Ophthalmology;  Laterality: Right;  [place] 00:24.8 AP% 6.0 CDE 1.47 FLUID PACK LOT # [id number]  . cervial fusion    . CYSTOSCOPY W/ RETROGRADES Bilateral 03/11/2016   Procedure: CYSTOSCOPY WITH RETROGRADE PYELOGRAM;  Surgeon: Hollice Espy, MD;  Location: [facility] ORS;  Service: Urology;  Laterality: Bilateral;  . CYSTOSCOPY WITH FULGERATION N/A 03/11/2016   Procedure: CYSTOSCOPY, BLADDER BIOPSY WITH FULGERATION;  Surgeon: Hollice Espy, MD;  Location: [facility] ORS;  Service: Urology;  Laterality: N/A;  . EYE SURGERY Left    Cataract extraction with IOL  . gamma knife     for acoustic neuroma  . Plate in head    . TRANSURETHRAL RESECTION OF BLADDER TUMOR WITH GYRUS (TURBT-GYRUS)      Prior to Admission medications   Medication Sig Start Date End Date Taking? Authorizing Provider  Cranberry-Vitamin C-Vitamin E (TH CRANBERRY CONCENTRATE PO) Take 2 tablets by mouth at bedtime.    Yes [provider]  finasteride (PROSCAR) 5 MG tablet Take 1 tablet (5 mg total) by mouth daily. pm Patient taking differently: Take 5 mg by mouth daily.  10/28/15  Yes Hollice Espy, MD  lisinopril (PRINIVIL,ZESTRIL) 20 MG tablet Take 20 mg by mouth at bedtime.    Yes [provider]  tamsulosin (FLOMAX) 0.4 MG CAPS capsule Take 1 capsule (0.4 mg total) by mouth daily. 10/08/16  Yes Hollice Espy, MD  acetaminophen (TYLENOL) 500 MG tablet Take 1,000 mg by mouth daily as needed for moderate pain.    [provider]  lidocaine (LMX) 4 % cream Apply 1 application topically 3 (three) times daily as needed (wrist pain).    [provider]  Polyethyl Glycol-Propyl Glycol (SYSTANE ULTRA OP) Place 1 drop into the  right eye daily as needed (lubrication).    [provider]     Allergies Oxycodone-acetaminophen  Family History  Problem Relation Age of Onset  . Cancer Mother     Social History Social History   Tobacco Use  . Smoking status: Former Smoker    Packs/day: 2.00    Types: Cigarettes    Last attempt to quit: 01/17/1979    Years since quitting: 39.3  . Smokeless tobacco: Never Used  Substance Use Topics  . Alcohol use: Yes    Comment: wine occ  . Drug use: No    Review of Systems  Constitutional: No fever/chills Eyes: No visual changes.  ENT: No sore throat. Cardiovascular: As above Respiratory: Denies shortness of breath. Gastrointestinal: No abdominal pain.  Genitourinary: Negative for dysuria. Musculoskeletal: Negative for back pain. Skin: Negative for rash. Neurological: Negative for headaches    ____________________________________________   PHYSICAL EXAM:  VITAL SIGNS: ED Triage Vitals  Enc Vitals Group     BP 05/14/18 2245 (!) 151/86     Pulse Rate 05/14/18 2239 93     Resp 05/14/18 2245 20     Temp 05/14/18 2245 98.3 F (36.8 C)     Temp Source 05/14/18 2245 Oral     SpO2 05/14/18 2245 100 %     Weight --      Height --      Head Circumference --      Peak Flow --      Pain Score 05/14/18 2239 5     Pain Loc --      Pain Edu? --      Excl. in [place]? --     Constitutional: Alert and oriented.  Ill-appearing Eyes: Conjunctivae are normal.   Nose: No congestion/rhinnorhea. Mouth/Throat: Mucous membranes are moist.    Cardiovascular: Normal rate, regular rhythm. Grossly normal heart sounds.  Good peripheral circulation. Respiratory: Normal respiratory effort.  No retractions. Lungs CTAB. Gastrointestinal: Soft and nontender.   Musculoskeletal: No lower extremity tenderness nor edema.  Warm and well perfused Neurologic:  Normal speech and language. No gross focal neurologic deficits are appreciated.  Skin:  Skin is warm,  diaphoretic Psychiatric: Mood and affect are normal. Speech and behavior are normal.  ____________________________________________   LABS (all labs ordered are listed, but only abnormal results are displayed)  Labs Reviewed  APTT  PROTIME-INR  COMPREHENSIVE METABOLIC PANEL  CBC  TROPONIN I   ____________________________________________  EKG  ED ECG REPORT I, Lavonia Drafts, the attending physician, personally viewed and interpreted this ECG.  Date: 05/14/2018  Rhythm: normal sinus rhythm QRS Axis: normal Intervals: normal ST/T Wave abnormalities: ST elevations anteriorly, ST depressions inferiorly Narrative Interpretation: ST elevation MI  ____________________________________________  RADIOLOGY  Chest x-ray ____________________________________________   PROCEDURES  Procedure(s) performed: No  Procedures   Critical Care performed:yes  CRITICAL CARE Performed by: Lavonia Drafts   Total critical care time:  20 minutes  Critical care time was exclusive of separately billable procedures and treating other patients.  Critical care was necessary to treat or prevent imminent or life-threatening deterioration.  Critical care was time spent personally by me on the following activities: development of treatment plan with patient and/or surrogate as well as nursing, discussions with consultants, evaluation of patient's response to treatment, examination of patient, obtaining history from patient or surrogate, ordering and performing treatments and interventions, ordering and review of laboratory studies, ordering and review of radiographic studies, pulse oximetry and re-evaluation of patient's condition.  ____________________________________________   INITIAL IMPRESSION / ASSESSMENT AND PLAN / ED COURSE  Pertinent labs & imaging results that were available during my care of the patient were reviewed by me and considered in my medical decision making (see chart for  details).  Patient presents with acute onset of chest pain, diaphoresis.  EKG is consistent with ST elevation MI, anterior.  Code STEMI paged at 10:35 PM, spoke with Dr. Saralyn Pilar, heparin and aspirin given.  Pending Cath Lab team  Spoke with Dr. Jannifer Franklin of the hospitalist service, made him aware of the patient    ____________________________________________   FINAL CLINICAL IMPRESSION(S) / ED DIAGNOSES  Final diagnoses:  ST elevation myocardial infarction (STEMI), unspecified artery [facility])        Note:  This document was prepared using Dragon voice recognition software and may include unintentional dictation errors.    Lavonia Drafts, MD 05/14/18 2249

## 2018-05-14 NOTE — ED Notes (Signed)
Pt ready for transport to cath lab. ED staff waiting on Cardiologist at this time. Pt continues to talk to staff but also continues with negative verbalizations and reports of pain.

## 2018-05-14 NOTE — ED Triage Notes (Signed)
Pt to ED reporting sudden onset of chest pain starting at 2130 this evening. Pt was diaphoretic per wife's report. Pt is alert upon arrival and able to answer questions. PT continues to be diaphoretic at this time.

## 2018-05-15 DIAGNOSIS — Z923 Personal history of irradiation: Secondary | ICD-10-CM | POA: Diagnosis not present

## 2018-05-15 DIAGNOSIS — E119 Type 2 diabetes mellitus without complications: Secondary | ICD-10-CM | POA: Diagnosis present

## 2018-05-15 DIAGNOSIS — I2102 ST elevation (STEMI) myocardial infarction involving left anterior descending coronary artery: Secondary | ICD-10-CM | POA: Diagnosis present

## 2018-05-15 DIAGNOSIS — I213 ST elevation (STEMI) myocardial infarction of unspecified site: Secondary | ICD-10-CM | POA: Diagnosis not present

## 2018-05-15 DIAGNOSIS — H9190 Unspecified hearing loss, unspecified ear: Secondary | ICD-10-CM | POA: Diagnosis present

## 2018-05-15 DIAGNOSIS — Z8551 Personal history of malignant neoplasm of bladder: Secondary | ICD-10-CM | POA: Diagnosis not present

## 2018-05-15 DIAGNOSIS — Z9842 Cataract extraction status, left eye: Secondary | ICD-10-CM | POA: Diagnosis not present

## 2018-05-15 DIAGNOSIS — Z981 Arthrodesis status: Secondary | ICD-10-CM | POA: Diagnosis not present

## 2018-05-15 DIAGNOSIS — Z87891 Personal history of nicotine dependence: Secondary | ICD-10-CM | POA: Diagnosis not present

## 2018-05-15 DIAGNOSIS — R079 Chest pain, unspecified: Secondary | ICD-10-CM | POA: Diagnosis not present

## 2018-05-15 DIAGNOSIS — Z9861 Coronary angioplasty status: Secondary | ICD-10-CM | POA: Diagnosis not present

## 2018-05-15 DIAGNOSIS — E785 Hyperlipidemia, unspecified: Secondary | ICD-10-CM | POA: Diagnosis present

## 2018-05-15 DIAGNOSIS — Z961 Presence of intraocular lens: Secondary | ICD-10-CM | POA: Diagnosis present

## 2018-05-15 DIAGNOSIS — Z885 Allergy status to narcotic agent status: Secondary | ICD-10-CM | POA: Diagnosis not present

## 2018-05-15 DIAGNOSIS — Z886 Allergy status to analgesic agent status: Secondary | ICD-10-CM | POA: Diagnosis not present

## 2018-05-15 DIAGNOSIS — Z9841 Cataract extraction status, right eye: Secondary | ICD-10-CM | POA: Diagnosis not present

## 2018-05-15 DIAGNOSIS — I2109 ST elevation (STEMI) myocardial infarction involving other coronary artery of anterior wall: Secondary | ICD-10-CM | POA: Diagnosis not present

## 2018-05-15 DIAGNOSIS — I10 Essential (primary) hypertension: Secondary | ICD-10-CM | POA: Diagnosis present

## 2018-05-15 DIAGNOSIS — N401 Enlarged prostate with lower urinary tract symptoms: Secondary | ICD-10-CM | POA: Diagnosis present

## 2018-05-15 DIAGNOSIS — N400 Enlarged prostate without lower urinary tract symptoms: Secondary | ICD-10-CM | POA: Diagnosis not present

## 2018-05-15 DIAGNOSIS — I2510 Atherosclerotic heart disease of native coronary artery without angina pectoris: Secondary | ICD-10-CM | POA: Diagnosis present

## 2018-05-15 DIAGNOSIS — Z87442 Personal history of urinary calculi: Secondary | ICD-10-CM | POA: Diagnosis not present

## 2018-05-15 LAB — HEMOGLOBIN A1C
Hgb A1c MFr Bld: 6.2 % — ABNORMAL HIGH (ref 4.8–5.6)
Mean Plasma Glucose: 131.24 mg/dL

## 2018-05-15 LAB — BASIC METABOLIC PANEL
Anion gap: 8 (ref 5–15)
Anion gap: 10 (ref 5–15)
BUN: 28 mg/dL — ABNORMAL HIGH (ref 8–23)
BUN: 28 mg/dL — AB (ref 8–23)
CHLORIDE: 105 mmol/L (ref 98–111)
CHLORIDE: 107 mmol/L (ref 98–111)
CO2: 25 mmol/L (ref 22–32)
CO2: 22 mmol/L (ref 22–32)
CREATININE: 1.49 mg/dL — AB (ref 0.61–1.24)
Calcium: 8.5 mg/dL — ABNORMAL LOW (ref 8.9–10.3)
Calcium: 8.3 mg/dL — ABNORMAL LOW (ref 8.9–10.3)
Creatinine, Ser: 1.25 mg/dL — ABNORMAL HIGH (ref 0.61–1.24)
GFR calc Af Amer: >60 mL/min (ref >60)
GFR calc non Af Amer: 44 mL/min — ABNORMAL LOW (ref >60)
GFR calc non Af Amer: 54 mL/min — ABNORMAL LOW (ref >60)
GFR, EST AFRICAN AMERICAN: 50 mL/min — AB (ref >60)
GLUCOSE: 135 mg/dL — AB (ref 70–99)
GLUCOSE: 118 mg/dL — AB (ref 70–99)
POTASSIUM: 4 mmol/L (ref 3.5–5.1)
Potassium: 4 mmol/L (ref 3.5–5.1)
Sodium: 138 mmol/L (ref 135–145)
Sodium: 139 mmol/L (ref 135–145)

## 2018-05-15 LAB — URINE DRUG SCREEN, QUALITATIVE (ARMC ONLY)
AMPHETAMINES, UR SCREEN: POSITIVE — AB
Barbiturates, Ur Screen: NOT DETECTED
Benzodiazepine, Ur Scrn: NOT DETECTED
Cannabinoid 50 Ng, Ur ~~LOC~~: NOT DETECTED
Cocaine Metabolite,Ur ~~LOC~~: NOT DETECTED
MDMA (ECSTASY) UR SCREEN: NOT DETECTED
Methadone Scn, Ur: NOT DETECTED
OPIATE, UR SCREEN: NOT DETECTED
PHENCYCLIDINE (PCP) UR S: NOT DETECTED
Tricyclic, Ur Screen: NOT DETECTED

## 2018-05-15 LAB — HEMOGLOBIN AND HEMATOCRIT, BLOOD
HCT: 39.8 % — ABNORMAL LOW (ref 40.0–52.0)
Hemoglobin: 13.6 g/dL (ref 13.0–18.0)

## 2018-05-15 LAB — CBC
HCT: 41.1 % (ref 40.0–52.0)
Hemoglobin: 14 g/dL (ref 13.0–18.0)
MCH: 32.1 pg (ref 26.0–34.0)
MCHC: 34 g/dL (ref 32.0–36.0)
MCV: 94.5 fL (ref 80.0–100.0)
PLATELETS: 168 10*3/uL (ref 150–440)
RBC: 4.35 MIL/uL — AB (ref 4.40–5.90)
RDW: 12.9 % (ref 11.5–14.5)
WBC: 11.7 10*3/uL — ABNORMAL HIGH (ref 3.8–10.6)

## 2018-05-15 LAB — MAGNESIUM
Magnesium: 2.2 mg/dL (ref 1.7–2.4)
Magnesium: 2.1 mg/dL (ref 1.7–2.4)

## 2018-05-15 LAB — CBC WITH DIFFERENTIAL/PLATELET
BASOS ABS: 0 10*3/uL (ref 0–0.1)
Basophils Relative: 0 %
Eosinophils Absolute: 0.1 10*3/uL (ref 0–0.7)
Eosinophils Relative: 1 %
HEMATOCRIT: 41.3 % (ref 40.0–52.0)
Hemoglobin: 13.9 g/dL (ref 13.0–18.0)
LYMPHS PCT: 20 %
Lymphs Abs: 2.5 10*3/uL (ref 1.0–3.6)
MCH: 32.1 pg (ref 26.0–34.0)
MCHC: 33.7 g/dL (ref 32.0–36.0)
MCV: 95.1 fL (ref 80.0–100.0)
MONO ABS: 1.1 10*3/uL — AB (ref 0.2–1.0)
Monocytes Relative: 8 %
NEUTROS ABS: 8.8 10*3/uL — AB (ref 1.4–6.5)
Neutrophils Relative %: 71 %
Platelets: 171 10*3/uL (ref 150–440)
RBC: 4.34 MIL/uL — AB (ref 4.40–5.90)
RDW: 13 % (ref 11.5–14.5)
WBC: 12.5 10*3/uL — ABNORMAL HIGH (ref 3.8–10.6)

## 2018-05-15 LAB — PHOSPHORUS: Phosphorus: 2.6 mg/dL (ref 2.5–4.6)

## 2018-05-15 LAB — MRSA PCR SCREENING: MRSA by PCR: NEGATIVE

## 2018-05-15 LAB — TROPONIN I
Troponin I: 14.59 ng/mL (ref <0.03)
Troponin I: 55.72 ng/mL (ref <0.03)
Troponin I: 38.26 ng/mL (ref <0.03)

## 2018-05-15 LAB — POCT ACTIVATED CLOTTING TIME: Activated Clotting Time: 351 seconds

## 2018-05-15 LAB — TSH: TSH: 1.88 u[IU]/mL (ref 0.350–4.500)

## 2018-05-15 MED ORDER — SODIUM CHLORIDE 0.9% FLUSH
3.0000 mL | Freq: Two times a day (BID) | INTRAVENOUS | Status: DC
  Administered 2018-05-15 – 2018-05-16 (×2): 3 mL via INTRAVENOUS

## 2018-05-15 MED ORDER — DOCUSATE SODIUM 100 MG PO CAPS
100.0000 mg | ORAL_CAPSULE | Freq: Two times a day (BID) | ORAL | Status: DC
  Administered 2018-05-15: 100 mg via ORAL
  Filled 2018-05-15 (×2): qty 1

## 2018-05-15 MED ORDER — SODIUM CHLORIDE 0.9% FLUSH: 3.0000 mL | INTRAVENOUS | Status: DC | PRN

## 2018-05-15 MED ORDER — TIROFIBAN HCL IV 12.5 MG/250 ML
0.0750 ug/kg/min | INTRAVENOUS | Status: AC
  Administered 2018-05-15: 0.075 ug/kg/min via INTRAVENOUS

## 2018-05-15 MED ORDER — FINASTERIDE 5 MG PO TABS
5.0000 mg | ORAL_TABLET | Freq: Every day | ORAL | Status: DC
  Administered 2018-05-15: 5 mg via ORAL
  Filled 2018-05-15: qty 1

## 2018-05-15 MED ORDER — LISINOPRIL 20 MG PO TABS
20.0000 mg | ORAL_TABLET | Freq: Every day | ORAL | Status: DC
  Administered 2018-05-15 – 2018-05-16 (×2): 20 mg via ORAL
  Filled 2018-05-15 (×2): qty 1

## 2018-05-15 MED ORDER — SODIUM CHLORIDE 0.9 % IV SOLN: 250.0000 mL | INTRAVENOUS | Status: DC | PRN

## 2018-05-15 MED ORDER — ACETAMINOPHEN 650 MG RE SUPP: 650.0000 mg | Freq: Four times a day (QID) | RECTAL | Status: DC | PRN

## 2018-05-15 MED ORDER — ONDANSETRON HCL 4 MG/2ML IJ SOLN: 4.0000 mg | Freq: Four times a day (QID) | INTRAMUSCULAR | Status: DC | PRN

## 2018-05-15 MED ORDER — ASPIRIN 81 MG PO CHEW
81.0000 mg | CHEWABLE_TABLET | Freq: Every day | ORAL | Status: DC
  Administered 2018-05-15 – 2018-05-16 (×2): 81 mg via ORAL
  Filled 2018-05-15 (×2): qty 1

## 2018-05-15 MED ORDER — SODIUM CHLORIDE 0.9 % IV SOLN
0.2500 mg/kg/h | INTRAVENOUS | Status: AC
  Administered 2018-05-15: 0.25 mg/kg/h via INTRAVENOUS
  Filled 2018-05-15: qty 250

## 2018-05-15 MED ORDER — LABETALOL HCL 5 MG/ML IV SOLN: 10.0000 mg | INTRAVENOUS | Status: AC | PRN

## 2018-05-15 MED ORDER — ENOXAPARIN SODIUM 40 MG/0.4ML ~~LOC~~ SOLN: 40.0000 mg | SUBCUTANEOUS | Status: DC

## 2018-05-15 MED ORDER — HYDRALAZINE HCL 20 MG/ML IJ SOLN: 5.0000 mg | INTRAMUSCULAR | Status: AC | PRN

## 2018-05-15 MED ORDER — ALUM & MAG HYDROXIDE-SIMETH 200-200-20 MG/5ML PO SUSP
30.0000 mL | Freq: Once | ORAL | Status: AC
  Administered 2018-05-15: 30 mL via ORAL
  Filled 2018-05-15: qty 30

## 2018-05-15 MED ORDER — PNEUMOCOCCAL VAC POLYVALENT 25 MCG/0.5ML IJ INJ: 0.5000 mL | INJECTION | INTRAMUSCULAR | Status: DC

## 2018-05-15 MED ORDER — ACETAMINOPHEN 325 MG PO TABS
650.0000 mg | ORAL_TABLET | Freq: Four times a day (QID) | ORAL | Status: DC | PRN
  Administered 2018-05-16: 650 mg via ORAL
  Filled 2018-05-15: qty 2

## 2018-05-15 MED ORDER — SODIUM CHLORIDE 0.9 % WEIGHT BASED INFUSION
1.0000 mL/kg/h | INTRAVENOUS | Status: AC
  Administered 2018-05-15: 1 mL/kg/h via INTRAVENOUS

## 2018-05-15 MED ORDER — TICAGRELOR 90 MG PO TABS
90.0000 mg | ORAL_TABLET | Freq: Two times a day (BID) | ORAL | Status: DC
  Administered 2018-05-15 – 2018-05-16 (×3): 90 mg via ORAL
  Filled 2018-05-15 (×4): qty 1

## 2018-05-15 MED ORDER — ONDANSETRON HCL 4 MG PO TABS: 4.0000 mg | ORAL_TABLET | Freq: Four times a day (QID) | ORAL | Status: DC | PRN

## 2018-05-15 MED ORDER — ATORVASTATIN CALCIUM 20 MG PO TABS
80.0000 mg | ORAL_TABLET | Freq: Every day | ORAL | Status: DC
  Administered 2018-05-15: 80 mg via ORAL
  Filled 2018-05-15: qty 4

## 2018-05-15 MED ORDER — METOPROLOL TARTRATE 25 MG PO TABS
25.0000 mg | ORAL_TABLET | Freq: Two times a day (BID) | ORAL | Status: DC
  Administered 2018-05-15 – 2018-05-16 (×2): 25 mg via ORAL
  Filled 2018-05-15 (×3): qty 1

## 2018-05-15 MED ORDER — TIROFIBAN HCL IV 12.5 MG/250 ML: 0.0750 ug/kg/min | INTRAVENOUS | Status: DC

## 2018-05-15 MED ORDER — SODIUM CHLORIDE 0.9 % IV SOLN: 0.2500 mg/kg/h | INTRAVENOUS | Status: DC

## 2018-05-15 MED ORDER — TAMSULOSIN HCL 0.4 MG PO CAPS
0.4000 mg | ORAL_CAPSULE | Freq: Every day | ORAL | Status: DC
  Administered 2018-05-15 – 2018-05-16 (×2): 0.4 mg via ORAL
  Filled 2018-05-15 (×2): qty 1

## 2018-05-15 MED ORDER — SODIUM CHLORIDE 0.45 % IV SOLN
INTRAVENOUS | Status: AC
  Administered 2018-05-15: 10:00:00 via INTRAVENOUS

## 2018-05-15 NOTE — Consult Note (Addendum)
PULMONARY / CRITICAL CARE MEDICINE   Name: Justin [name] MRN: [id number] DOB: December 15, 1940    ADMISSION DATE:  05/14/2018   CONSULTATION DATE:  03/14/2018  REFERRING MD: Dr Josefa Half Recent: Status post left heart catheterization  HISTORY OF PRESENT ILLNESS:   77 y/o male who presented with sudden onset chest pain and diaphoresis; ruled in for a STEMI ST elevations in lead V2, V3 and V4 consistent with an anterior ST elevation MI.  He was taken to the cath lab and a stent was placed in the LAD.  He reports complete resolution of symptoms.  PAST MEDICAL HISTORY :  He  has a past medical history of Arthritis, Bell's palsy, Bell's palsy, BPH (benign prostatic hyperplasia), Cancer ([place]), Deaf, Dizziness, History of urinary urgency, Hydronephrosis, bilateral, Hypertension, Kidney stone, Neuroma, Palsy ([place]), Urinary frequency, and Urinary tract infection.  PAST SURGICAL HISTORY: He  has a past surgical history that includes gamma knife; Back surgery; cervial fusion; Carpal tunnel release (Bilateral, 01/25/2015); Plate in head; Transurethral resection of bladder tumor with gyrus (turbt-gyrus); Cataract extraction w/PHACO (Left, 06/06/2015); Brain tumor excision (2012); Eye surgery (Left); Cystoscopy w/ retrogrades (Bilateral, 03/11/2016); Cystoscopy with fulgeration (N/A, 03/11/2016); and Cataract extraction w/PHACO (Right, 01/20/2018).  Allergies  Allergen Reactions  . Oxycodone-Acetaminophen Hives    No current facility-administered medications on file prior to encounter.    Current Outpatient Medications on File Prior to Encounter  Medication Sig  . Cranberry-Vitamin C-Vitamin E (TH CRANBERRY CONCENTRATE PO) Take 2 tablets by mouth daily.   . finasteride (PROSCAR) 5 MG tablet Take 1 tablet (5 mg total) by mouth daily. pm (Patient taking differently: Take 5 mg by mouth daily. )  . lisinopril (PRINIVIL,ZESTRIL) 20 MG tablet Take 20 mg by mouth daily.   Marland Kitchen OVER THE COUNTER MEDICATION Take 1 tablet by  mouth 2 (two) times daily. Prosta-Rye Plus  . tamsulosin (FLOMAX) 0.4 MG CAPS capsule Take 1 capsule (0.4 mg total) by mouth daily.    FAMILY HISTORY:  His family history includes Cancer in his mother.  SOCIAL HISTORY: He  reports that he quit smoking about 39 years ago. His smoking use included cigarettes. He smoked 2.00 packs per day. He has never used smokeless tobacco. He reports that he drinks alcohol. He reports that he does not use drugs.  REVIEW OF SYSTEMS:   Constitutional: Negative for fever and chills.  HENT: Negative for congestion and rhinorrhea.  Eyes: Negative for redness and visual disturbance.  Respiratory: Negative for shortness of breath and wheezing.  Cardiovascular: chest pain and shortness of breath resolved   Gastrointestinal: Negative  for nausea , vomiting and abdominal pain and  Loose stools Genitourinary: Negative for dysuria and urgency.  Endocrine: Denies polyuria, polyphagia and heat intolerance Musculoskeletal: Negative for myalgias and arthralgias.  Skin: Negative for pallor and wound.  Neurological: Negative for dizziness and headaches   SUBJECTIVE:   VITAL SIGNS: BP 117/85   Pulse 81   Temp 98.3 F (36.8 C) (Oral)   Resp 18   Ht 5\' 11"  (1.803 m)   Wt 89.4 kg   SpO2 98%   BMI 27.48 kg/m   HEMODYNAMICS:    VENTILATOR SETTINGS:    INTAKE / OUTPUT: No intake/output data recorded.  PHYSICAL EXAMINATION: General: NAD Neuro:  AAO X4, no focal deficits HEENT: PERRLA, no JVD Cardiovascular:  AP regular, S1/S2, no MRG Lungs:  ctab Abdomen:  Non-distended, normal bowel sounds Musculoskeletal:  +rom Skin:  Warm and dry  LABS:  BMET Recent Labs  Lab 05/14/18 2237  NA 139  K 3.7  CL 103  CO2 25  BUN 28*  CREATININE 1.64*  GLUCOSE 162*    Electrolytes Recent Labs  Lab 05/14/18 2237  CALCIUM 9.1    CBC Recent Labs  Lab 05/14/18 2237  WBC 14.8*  HGB 15.9  HCT 46.7  PLT 181    Coag's Recent Labs  Lab  05/14/18 2237  APTT 26  INR 0.97    Sepsis Markers No results for input(s): LATICACIDVEN, PROCALCITON, O2SATVEN in the last 168 hours.  ABG No results for input(s): PHART, PCO2ART, PO2ART in the last 168 hours.  Liver Enzymes Recent Labs  Lab 05/14/18 2237  AST 32  ALT 21  ALKPHOS 59  BILITOT 0.8  ALBUMIN 4.3    Cardiac Enzymes Recent Labs  Lab 05/14/18 2237  TROPONINI 0.05*    Glucose No results for input(s): GLUCAP in the last 168 hours.  Imaging Dg Chest Port 1 View  Result Date: 05/14/2018 CLINICAL DATA:  Chest pain EXAM: PORTABLE CHEST 1 VIEW COMPARISON:  None. FINDINGS: The lungs are well inflated. There is mild cardiomegaly. There is no focal airspace consolidation or pulmonary edema. There is no pleural effusion or pneumothorax. IMPRESSION: No active disease. Electronically Signed   By: Ulyses Jarred M.D.   On: 05/14/2018 23:36   ASSESSMENT  ST elevation MI status post left heart catheterization and coronary angiogram with PCI to the LAD Hypertension   PLAN Hemodynamic monitoring per ICU protocol Treatment plan per cardiology Obtain a urine drug screen Repeat EKG and cardiac enzymes Repeat chemistry to rule out any electrolyte abnormalities GI and DVT prophylaxis  [facility] ANP-BC Pulmonary and Critical Care Medicine [facility] Pager [phone] or [phone]  NB: This document was prepared using Dragon voice recognition software and may include unintentional dictation errors.   05/15/2018, 1:50 AM  Patient is status post cardiac cath, no chest pain.  Status post DES to LAD on dual antiplatelets ASA + Brilinta.  Management as per cardiology No evidence of having hematuria and remains on Angiomax. -Gentle hydration -Monitor H&H

## 2018-05-15 NOTE — Progress Notes (Signed)
Patient ambulated around ICU and tolerated well.  Denied chest pain throughout shift.  A&Ox4.  Right groin site continues to remain a Level 1.  Will be moved to room 247 at 1900 per 2A request.  Report has been called and ICU NT will transfer patient.

## 2018-05-15 NOTE — Progress Notes (Signed)
Dr. Saralyn Pilar present and RN made him aware of troponin of 55.72 and MD acknowledged giving no new order.

## 2018-05-15 NOTE — Progress Notes (Signed)
Report given to Manuela Schwartz, RN on 2A.  Patient will be transferred to new room at 1900 per 2A nursing request.

## 2018-05-15 NOTE — Progress Notes (Signed)
   05/15/18 0100  Clinical Encounter Type  Visited With Patient and family together  Visit Type Initial  Referral From Physician  Consult/Referral To Chaplain  Spiritual Encounters  Spiritual Needs Prayer;Emotional  Stress Factors  Patient Stress Factors Health changes  Family Stress Factors Health changes  CH was altered by a code STEMI, the CG proceeded to ED7, where he met the patient, wife and ED staff who were awaiting the Cardiologist. Pastoral presence ensued, and I said a silent prayer before the patient left to the Cath lab.  I accompanied and conversed with the wide, waiting in the specialty care waiting area. Presence ensued until the patient was directed to the ICU, post op. he reportedly had one vessel the (LAD) stented.

## 2018-05-15 NOTE — Progress Notes (Signed)
[facility]  SUBJECTIVE: Patient laying in bed, complains of indigestion, denies chest pain   Vitals:   05/15/18 0700 05/15/18 0800 05/15/18 0900 05/15/18 1000  BP: 132/82 127/73 (!) 146/76 113/82  Pulse: 73 82 73 76  Resp: 20 20 20  (!) 21  Temp:      TempSrc:      SpO2: 98% 97% 96% 98%  Weight:      Height:         Intake/Output Summary (Last 24 hours) at 05/15/2018 1017 Last data filed at 05/15/2018 1000 Gross per 24 hour  Intake 1402.14 ml  Output 1450 ml  Net -47.86 ml      PHYSICAL EXAM  General: Well developed, well nourished, in no acute distress HEENT:  Normocephalic and atramatic Neck:  No JVD.  Lungs: Clear bilaterally to auscultation and percussion. Heart: HRRR . Normal S1 and S2 without gallops or murmurs.  Abdomen: Bowel sounds are positive, abdomen soft and non-tender  Msk:  Back normal, normal gait. Normal strength and tone for age. Extremities: No clubbing, cyanosis or edema.   Neuro: Alert and oriented X 3. Psych:  Good affect, responds appropriately   LABS: Basic Metabolic Panel: Recent Labs    05/15/18 0224 05/15/18 0654  NA 138 139  K 4.0 4.0  CL 105 107  CO2 25 22  GLUCOSE 135* 118*  BUN 28* 28*  CREATININE 1.49* 1.25*  CALCIUM 8.5* 8.3*  MG 2.2 2.1  PHOS  --  2.6   Liver Function Tests: Recent Labs    05/14/18 2237  AST 32  ALT 21  ALKPHOS 59  BILITOT 0.8  PROT 7.6  ALBUMIN 4.3   No results for input(s): LIPASE, AMYLASE in the last 72 hours. CBC: Recent Labs    05/15/18 0224 05/15/18 0654  WBC 11.7* 12.5*  NEUTROABS  --  8.8*  HGB 14.0 13.9  HCT 41.1 41.3  MCV 94.5 95.1  PLT 168 171   Cardiac Enzymes: Recent Labs    05/14/18 2237 05/15/18 0224 05/15/18 0654  TROPONINI 0.05* 14.59* 55.72*   BNP: Invalid input(s): POCBNP D-Dimer: No results for input(s): DDIMER in the last 72 hours. Hemoglobin A1C: No results for input(s): HGBA1C in the last 72 hours. Fasting Lipid Panel: No results for input(s): CHOL,  HDL, LDLCALC, TRIG, CHOLHDL, LDLDIRECT in the last 72 hours. Thyroid Function Tests: Recent Labs    05/15/18 0654  TSH 1.880   Anemia Panel: No results for input(s): VITAMINB12, FOLATE, FERRITIN, TIBC, IRON, RETICCTPCT in the last 72 hours.  Dg Chest Port 1 View  Result Date: 05/14/2018 CLINICAL DATA:  Chest pain EXAM: PORTABLE CHEST 1 VIEW COMPARISON:  None. FINDINGS: The lungs are well inflated. There is mild cardiomegaly. There is no focal airspace consolidation or pulmonary edema. There is no pleural effusion or pneumothorax. IMPRESSION: No active disease. Electronically Signed   By: Ulyses Jarred M.D.   On: 05/14/2018 23:36     Echo pending  TELEMETRY: Sinus rhythm:  ASSESSMENT AND PLAN:  Active Problems:   Acute ST elevation myocardial infarction (STEMI) involving left anterior descending (LAD) coronary artery (HCC)   STEMI (ST elevation myocardial infarction) ([place])    1.  Acute anterior ST elevation myocardial infarction, test post primary PCI, with DES ostium LAD 2.  Essential hypertension  Recommendations  1.  Agree with current therapy 2.  2D echocardiogram 3.  May transfer to telemetry later today if patient remains clinically stable   Isaias Cowman, MD, PhD, [facility] 05/15/2018  10:17 AM

## 2018-05-15 NOTE — Progress Notes (Signed)
Per RN Tirofiban infusion started ~0000. Reordered to run until ~1800.  Sim Boast, PharmD, BCPS  05/15/18 2:09 AM

## 2018-05-15 NOTE — H&P (Addendum)
Justin [name] is an 77 y.o. male.   Chief Complaint: Chest pain HPI: The patient with past medical history of bladder cancer, hypertension and BPH presents to the emergency department complaining of chest pain.  His pain began while he was watching television.  It was substernal and gradually worsened in severity.  He had associated shortness of breath but denies nausea, vomiting or diaphoresis.  The pain radiated to his back.  Laboratory evaluation revealed markedly increased troponin which prompted the emergency department staff to initiate STEMI protocol.  He was taken to the Cath Lab and received a drug-eluting stent to the LAD prior to transfer to the ICU at which time the hospitalist service was called for medical management.  Past Medical History:  Diagnosis Date  . Arthritis   . Bell's palsy   . Bell's palsy   . BPH (benign prostatic hyperplasia)   . Cancer ([place])    bladder  . Deaf    right ear  . Dizziness   . History of urinary urgency   . Hydronephrosis, bilateral   . Hypertension   . Kidney stone   . Neuroma    acoustic  . 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 Urinary frequency   . Urinary tract infection     Past Surgical History:  Procedure Laterality Date  . BACK SURGERY     cervical fusion  . BRAIN TUMOR EXCISION  2012   Radiation therapy  . CARPAL TUNNEL RELEASE Bilateral 01/25/2015   Procedure: CARPAL TUNNEL RELEASE;  Surgeon: Christophe Louis, MD;  Location: [facility] ORS;  Service: Orthopedics;  Laterality: Bilateral;  . CATARACT EXTRACTION W/PHACO Left 06/06/2015   Procedure: CATARACT EXTRACTION PHACO AND INTRAOCULAR LENS PLACEMENT (IOC);  Surgeon: Lyla Glassing, MD;  Location: [facility] ORS;  Service: Ophthalmology;  Laterality: Left;  [place]: 01:59.3 AP%: 13.7 CDE: 16.45 Lot # [id number]  . CATARACT EXTRACTION W/PHACO Right 01/20/2018   Procedure: CATARACT EXTRACTION PHACO AND INTRAOCULAR LENS PLACEMENT (IOC);  Surgeon: Eulogio Bear, MD;  Location: [facility] ORS;   Service: Ophthalmology;  Laterality: Right;  [place] 00:24.8 AP% 6.0 CDE 1.47 FLUID PACK LOT # [id number]  . cervial fusion    . CYSTOSCOPY W/ RETROGRADES Bilateral 03/11/2016   Procedure: CYSTOSCOPY WITH RETROGRADE PYELOGRAM;  Surgeon: Hollice Espy, MD;  Location: [facility] ORS;  Service: Urology;  Laterality: Bilateral;  . CYSTOSCOPY WITH FULGERATION N/A 03/11/2016   Procedure: CYSTOSCOPY, BLADDER BIOPSY WITH FULGERATION;  Surgeon: Hollice Espy, MD;  Location: [facility] ORS;  Service: Urology;  Laterality: N/A;  . EYE SURGERY Left    Cataract extraction with IOL  . gamma knife     for acoustic neuroma  . Plate in head    . TRANSURETHRAL RESECTION OF BLADDER TUMOR WITH GYRUS (TURBT-GYRUS)      Family History  Problem Relation Age of Onset  . Cancer Mother    Social History:  reports that he quit smoking about 39 years ago. His smoking use included cigarettes. He smoked 2.00 packs per day. He has never used smokeless tobacco. He reports that he drinks alcohol. He reports that he does not use drugs.  Allergies:  Allergies  Allergen Reactions  . Oxycodone-Acetaminophen Hives    Medications Prior to Admission  Medication Sig Dispense Refill  . Cranberry-Vitamin C-Vitamin E (TH CRANBERRY CONCENTRATE PO) Take 2 tablets by mouth daily.     . finasteride (PROSCAR) 5 MG tablet Take 1 tablet (5 mg total) by mouth daily. pm (Patient taking differently: Take  5 mg by mouth daily. ) 30 tablet 3  . lisinopril (PRINIVIL,ZESTRIL) 20 MG tablet Take 20 mg by mouth daily.     Marland Kitchen OVER THE COUNTER MEDICATION Take 1 tablet by mouth 2 (two) times daily. Prosta-Rye Plus    . tamsulosin (FLOMAX) 0.4 MG CAPS capsule Take 1 capsule (0.4 mg total) by mouth daily. 30 capsule 3    Results for orders placed or performed during the hospital encounter of 05/14/18 (from the past 48 hour(s))  APTT     Status: None   Collection Time: 05/14/18 10:37 PM  Result Value Ref Range   aPTT 26 24 - 36 seconds    Comment: Performed at  [facility], [address] [place], [place] [address]  Protime-INR     Status: None   Collection Time: 05/14/18 10:37 PM  Result Value Ref Range   Prothrombin Time 12.8 11.4 - 15.2 seconds   INR 0.97     Comment: Performed at [facility], [address] [place], [place] [address]  Comprehensive metabolic panel     Status: Abnormal   Collection Time: 05/14/18 10:37 PM  Result Value Ref Range   Sodium 139 135 - 145 mmol/L   Potassium 3.7 3.5 - 5.1 mmol/L   Chloride 103 98 - 111 mmol/L   CO2 25 22 - 32 mmol/L   Glucose, Bld 162 (H) 70 - 99 mg/dL   BUN 28 (H) 8 - 23 mg/dL   Creatinine, Ser 1.64 (H) 0.61 - 1.24 mg/dL   Calcium 9.1 8.9 - 10.3 mg/dL   Total Protein 7.6 6.5 - 8.1 g/dL   Albumin 4.3 3.5 - 5.0 g/dL   AST 32 15 - 41 U/L   ALT 21 0 - 44 U/L   Alkaline Phosphatase 59 38 - 126 U/L   Total Bilirubin 0.8 0.3 - 1.2 mg/dL   GFR calc non Af Amer 39 (L) >60 mL/min   GFR calc Af Amer 45 (L) >60 mL/min    Comment: (NOTE) The eGFR has been calculated using the CKD EPI equation. This calculation has not been validated in all clinical situations. eGFR's persistently <60 mL/min signify possible Chronic Kidney Disease.    Anion gap 11 5 - 15    Comment: Performed at [facility], [address] [place], [place] [address]  CBC     Status: Abnormal   Collection Time: 05/14/18 10:37 PM  Result Value Ref Range   WBC 14.8 (H) 3.8 - 10.6 K/uL   RBC 4.97 4.40 - 5.90 MIL/uL   Hemoglobin 15.9 13.0 - 18.0 g/dL   HCT 46.7 40.0 - 52.0 %   MCV 94.0 80.0 - 100.0 fL   MCH 31.9 26.0 - 34.0 pg   MCHC 34.0 32.0 - 36.0 g/dL   RDW 13.1 11.5 - 14.5 %   Platelets 181 150 - 440 K/uL    Comment: Performed at [facility], [address] [place], [place] [address]  Troponin I     Status: Abnormal   Collection Time: 05/14/18 10:37 PM  Result Value Ref Range   Troponin I 0.05 (HH) <0.03 ng/mL    Comment: CRITICAL RESULT CALLED TO, READ BACK BY AND VERIFIED  WITH JOHN HOFFMASTER AT 2308 05/14/18.PMH Performed at [facility], [address] [place], [place] [address]   POCT Activated clotting time     Status: None   Collection Time: 05/14/18 11:55 PM  Result Value Ref Range   Activated Clotting Time  351 seconds  MRSA PCR Screening     Status: None   Collection Time: 05/15/18  1:09 AM  Result Value Ref Range   MRSA by PCR NEGATIVE NEGATIVE    Comment:        The GeneXpert MRSA Assay (FDA approved for NASAL specimens only), is one component of a comprehensive MRSA colonization surveillance program. It is not intended to diagnose MRSA infection nor to guide or monitor treatment for MRSA infections. Performed at [facility], [address] [place], [place] [address]   Basic metabolic panel     Status: Abnormal   Collection Time: 05/15/18  2:24 AM  Result Value Ref Range   Sodium 138 135 - 145 mmol/L   Potassium 4.0 3.5 - 5.1 mmol/L   Chloride 105 98 - 111 mmol/L   CO2 25 22 - 32 mmol/L   Glucose, Bld 135 (H) 70 - 99 mg/dL   BUN 28 (H) 8 - 23 mg/dL   Creatinine, Ser 1.49 (H) 0.61 - 1.24 mg/dL   Calcium 8.5 (L) 8.9 - 10.3 mg/dL   GFR calc non Af Amer 44 (L) >60 mL/min   GFR calc Af Amer 50 (L) >60 mL/min    Comment: (NOTE) The eGFR has been calculated using the CKD EPI equation. This calculation has not been validated in all clinical situations. eGFR's persistently <60 mL/min signify possible Chronic Kidney Disease.    Anion gap 8 5 - 15    Comment: Performed at [facility], [address] [place], [place] [address]  CBC     Status: Abnormal   Collection Time: 05/15/18  2:24 AM  Result Value Ref Range   WBC 11.7 (H) 3.8 - 10.6 K/uL   RBC 4.35 (L) 4.40 - 5.90 MIL/uL   Hemoglobin 14.0 13.0 - 18.0 g/dL   HCT 41.1 40.0 - 52.0 %   MCV 94.5 80.0 - 100.0 fL   MCH 32.1 26.0 - 34.0 pg   MCHC 34.0 32.0 - 36.0 g/dL   RDW 12.9 11.5 - 14.5 %   Platelets 168 150 - 440 K/uL    Comment: Performed at  [facility], [address], [place], [place] [address]  Magnesium     Status: None   Collection Time: 05/15/18  2:24 AM  Result Value Ref Range   Magnesium 2.2 1.7 - 2.4 mg/dL    Comment: Performed at [facility], [address] [place], [place] [address]  Troponin I     Status: Abnormal   Collection Time: 05/15/18  2:24 AM  Result Value Ref Range   Troponin I 14.59 (HH) <0.03 ng/mL    Comment: CRITICAL RESULT CALLED TO, READ BACK BY AND VERIFIED WITH JOSHUA WILLIAMSON AT 0300 05/15/18.PMH Performed at [facility], [address] [place], [place] [address]    Dg Chest Port 1 View  Result Date: 05/14/2018 CLINICAL DATA:  Chest pain EXAM: PORTABLE CHEST 1 VIEW COMPARISON:  None. FINDINGS: The lungs are well inflated. There is mild cardiomegaly. There is no focal airspace consolidation or pulmonary edema. There is no pleural effusion or pneumothorax. IMPRESSION: No active disease. Electronically Signed   By: Ulyses Jarred M.D.   On: 05/14/2018 23:36    Review of Systems  Constitutional: Negative for chills and fever.  HENT: Negative for sore throat and tinnitus.   Eyes: Negative for blurred vision and redness.  Respiratory: Positive for shortness of breath. Negative for cough.   Cardiovascular: Positive for chest pain.  Negative for palpitations, orthopnea and PND.  Gastrointestinal: Negative for abdominal pain, diarrhea, nausea and vomiting.  Genitourinary: Negative for dysuria, frequency and urgency.  Musculoskeletal: Negative for joint pain and myalgias.  Skin: Negative for rash.       No lesions  Neurological: Negative for speech change, focal weakness and weakness.  Endo/Heme/Allergies: Does not bruise/bleed easily.       No temperature intolerance  Psychiatric/Behavioral: Negative for depression and suicidal ideas.    Blood pressure 112/75, pulse 69, temperature 98.3 F (36.8 C), temperature source Oral, resp. rate (!) 22, height 5' 11"  (1.803  m), weight 89.4 kg, SpO2 98 %. Physical Exam  Vitals reviewed. Constitutional: He is oriented to person, place, and time. He appears well-developed and well-nourished. No distress.  HENT:  Head: Normocephalic and atraumatic.  Mouth/Throat: Oropharynx is clear and moist.  Eyes: Pupils are equal, round, and reactive to light. Conjunctivae and EOM are normal. No scleral icterus.  Neck: Normal range of motion. Neck supple. No JVD present. No tracheal deviation present. No thyromegaly present.  Cardiovascular: Normal rate, regular rhythm and normal heart sounds. Exam reveals no gallop and no friction rub.  No murmur heard. Respiratory: Effort normal and breath sounds normal. No respiratory distress.  GI: Soft. Bowel sounds are normal. He exhibits no distension. There is no tenderness.  Genitourinary:  Genitourinary Comments: Deferred  Musculoskeletal: Normal range of motion. He exhibits no edema.  Lymphadenopathy:    He has no cervical adenopathy.  Neurological: He is alert and oriented to person, place, and time. No cranial nerve deficit.  Skin: Skin is warm and dry. No rash noted. No erythema.  Psychiatric: He has a normal mood and affect. His behavior is normal. Judgment and thought content normal.     Assessment/Plan This is a 77 year old male admitted for STEMI. 1.  STEMI: Anterior infarct status post PCI.  Chest pain is relieved.  Aggrastat per cardiology protocol.  Manage blood pressure.  2.  CAD: Drug-eluting stent placed to LAD.  Start Brilinta.  Continue aspirin 3.  Hypertension: Controlled; continue lisinopril and hydralazine.  Labetalol as needed. 4.  BPH: Continue tamsulosin and finasteride 5.  Hyperlipidemia: Continue statin therapy 6.  DVT prophylaxis: Lovenox once Aggrastat is complete 7.  GI prophylaxis: None The patient is a full code.  Time spent on admission orders and critical care approximately 45 minutes  Harrie Foreman, MD 05/15/2018, 6:17 AM

## 2018-05-15 NOTE — Progress Notes (Signed)
[facility] [place] NAME: Justin [name]    MR#:  [id number]  DATE OF BIRTH:  Oct 26, 1940  SUBJECTIVE:  CHIEF COMPLAINT:   Chief Complaint  Patient presents with  . Chest Pain   - admitted with chest pain- noted to have ant wall STEMI- s/p LAD stent - doing well now  REVIEW OF SYSTEMS:  Review of Systems  Constitutional: Negative for chills and fever.  HENT: Negative for ear discharge, hearing loss and nosebleeds.   Eyes: Negative for blurred vision and double vision.  Respiratory: Negative for cough, shortness of breath and wheezing.   Cardiovascular: Negative for chest pain, palpitations and leg swelling.  Gastrointestinal: Negative for abdominal pain, constipation, diarrhea, nausea and vomiting.  Genitourinary: Negative for dysuria.  Musculoskeletal: Negative for myalgias.  Neurological: Negative for dizziness, focal weakness, seizures, weakness and headaches.  Psychiatric/Behavioral: Negative for depression.    DRUG ALLERGIES:   Allergies  Allergen Reactions  . Oxycodone-Acetaminophen Hives    VITALS:  Blood pressure 132/82, pulse 73, temperature 98.3 F (36.8 C), temperature source Oral, resp. rate 20, height 5\' 11"  (1.803 m), weight 89.4 kg, SpO2 98 %.  PHYSICAL EXAMINATION:  Physical Exam  GENERAL:  77 y.o.-year-old patient lying in the bed with no acute distress.  EYES: Pupils equal, round, reactive to light and accommodation. No scleral icterus. Extraocular muscles intact.  HEENT: Head atraumatic, normocephalic. Oropharynx and nasopharynx clear. Right sided bells palsy NECK:  Supple, no jugular venous distention. No thyroid enlargement, no tenderness.  LUNGS: Normal breath sounds bilaterally, no wheezing, rales,rhonchi or crepitation. No use of accessory muscles of respiration.  CARDIOVASCULAR: S1, S2 normal. No murmurs, rubs, or gallops.  ABDOMEN: Soft, nontender, nondistended. Bowel sounds present. No organomegaly  or mass.  EXTREMITIES: No pedal edema, cyanosis, or clubbing. Right groin cath site looks clean NEUROLOGIC: Cranial nerves II through XII are intact. Muscle strength 5/5 in all extremities. Sensation intact. Gait not checked.  PSYCHIATRIC: The patient is alert and oriented x 3.  SKIN: No obvious rash, lesion, or ulcer.    LABORATORY PANEL:   CBC Recent Labs  Lab 05/15/18 0654  WBC 12.5*  HGB 13.9  HCT 41.3  PLT 171   ------------------------------------------------------------------------------------------------------------------  Chemistries  Recent Labs  Lab 05/14/18 2237  05/15/18 0654  NA 139   < > 139  K 3.7   < > 4.0  CL 103   < > 107  CO2 25   < > 22  GLUCOSE 162*   < > 118*  BUN 28*   < > 28*  CREATININE 1.64*   < > 1.25*  CALCIUM 9.1   < > 8.3*  MG  --    < > 2.1  AST 32  --   --   ALT 21  --   --   ALKPHOS 59  --   --   BILITOT 0.8  --   --    < > = values in this interval not displayed.   ------------------------------------------------------------------------------------------------------------------  Cardiac Enzymes Recent Labs  Lab 05/15/18 0654  TROPONINI 55.72*   ------------------------------------------------------------------------------------------------------------------  RADIOLOGY:  Dg Chest Port 1 View  Result Date: 05/14/2018 CLINICAL DATA:  Chest pain EXAM: PORTABLE CHEST 1 VIEW COMPARISON:  None. FINDINGS: The lungs are well inflated. There is mild cardiomegaly. There is no focal airspace consolidation or pulmonary edema. There is no pleural effusion or pneumothorax. IMPRESSION: No active disease. Electronically Signed   By: Ulyses Jarred  M.D.   On: 05/14/2018 23:36    EKG:   Orders placed or performed during the hospital encounter of 05/14/18  . ED EKG  . ED EKG  . EKG 12-Lead immediately post procedure  . EKG 12-Lead  . EKG 12-Lead immediately post procedure  . EKG 12-Lead    ASSESSMENT AND PLAN:   77 year old male with  past medical history significant for right-sided Bell's palsy, history of accoustic neuroma, hypertension, diabetes presents to hospital secondary to chest pain and noted to have STEMI.  1.  Acute myocardial infarction-anterior wall STEMI on presentation, appreciate cardiology consult -Status post emergency cardiac catheterization and 99% LAD occlusion is stented now. -Continue aspirin and Brilinta -Add low-dose beta-blocker.  On lisinopril, statin -Cardiac rehab at discharge. -Monitor in ICU and possible transfer to telemetry floor later today -Troponin is elevated. -LV gram showing 45 to 50% LV function, with apical hypokinesis  2.  Hypertension-on lisinopril, add metoprolol  3.  BPH-on Flomax and finasteride  4.  DVT prophylaxis-Lovenox has been started  Encourage ambulation later today.   All the records are reviewed and case discussed with Care Management/Social Workerr. Management plans discussed with the patient, family and they are in agreement.  CODE STATUS: Full Code  TOTAL TIME TAKING CARE OF THIS PATIENT: 38 minutes.   POSSIBLE D/C TOMORROW, DEPENDING ON CLINICAL CONDITION.   [name] M.D on 05/15/2018 at 8:55 AM  Between 7am to 6pm - Pager - [phone]  After 6pm go to [email or web address] - password [email or web address] [place] [facility]  Office  [phone]  CC: Primary care physician; [name], PA-C

## 2018-05-15 NOTE — Consult Note (Signed)
[facility]  CARDIOLOGY CONSULT NOTE  Patient ID: Justin [name] MRN: [id number] DOB/AGE: [birth year] 77 y.o.  Admit date: 05/14/2018 Referring Physician [facility] Primary Physician  Primary Cardiologist  Reason for Consultation anterior ST elevation myocardial infarction  HPI: 77 year old gentleman referred for anterior ST elevation myocardial infarction.  The patient was in his usual state of health until earlier this evening, when he developed chest pain which she initially thought was indigestion.  Chest pain became more severe, associated with diaphoresis.  He presented to [facility] emergency room where ECG revealed diagnostic ST elevations in leads V2, V3 and V4 system with anterior ST elevation myocardial infarction.  Review of systems complete and found to be negative unless listed above     Past Medical History:  Diagnosis Date  . Arthritis   . Bell's palsy   . Bell's palsy   . BPH (benign prostatic hyperplasia)   . Cancer ([place])    bladder  . Deaf    right ear  . Dizziness   . History of urinary urgency   . Hydronephrosis, bilateral   . Hypertension   . Kidney stone   . Neuroma    acoustic  . 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 Urinary frequency   . Urinary tract infection     Past Surgical History:  Procedure Laterality Date  . BACK SURGERY     cervical fusion  . BRAIN TUMOR EXCISION  2012   Radiation therapy  . CARPAL TUNNEL RELEASE Bilateral 01/25/2015   Procedure: CARPAL TUNNEL RELEASE;  Surgeon: Christophe Louis, MD;  Location: [facility] ORS;  Service: Orthopedics;  Laterality: Bilateral;  . CATARACT EXTRACTION W/PHACO Left 06/06/2015   Procedure: CATARACT EXTRACTION PHACO AND INTRAOCULAR LENS PLACEMENT (IOC);  Surgeon: Lyla Glassing, MD;  Location: [facility] ORS;  Service: Ophthalmology;  Laterality: Left;  [place]: 01:59.3 AP%: 13.7 CDE: 16.45 Lot # [id number]  . CATARACT EXTRACTION W/PHACO Right 01/20/2018   Procedure: CATARACT EXTRACTION PHACO AND INTRAOCULAR LENS  PLACEMENT (IOC);  Surgeon: Eulogio Bear, MD;  Location: [facility] ORS;  Service: Ophthalmology;  Laterality: Right;  [place] 00:24.8 AP% 6.0 CDE 1.47 FLUID PACK LOT # [id number]  . cervial fusion    . CYSTOSCOPY W/ RETROGRADES Bilateral 03/11/2016   Procedure: CYSTOSCOPY WITH RETROGRADE PYELOGRAM;  Surgeon: Hollice Espy, MD;  Location: [facility] ORS;  Service: Urology;  Laterality: Bilateral;  . CYSTOSCOPY WITH FULGERATION N/A 03/11/2016   Procedure: CYSTOSCOPY, BLADDER BIOPSY WITH FULGERATION;  Surgeon: Hollice Espy, MD;  Location: [facility] ORS;  Service: Urology;  Laterality: N/A;  . EYE SURGERY Left    Cataract extraction with IOL  . gamma knife     for acoustic neuroma  . Plate in head    . TRANSURETHRAL RESECTION OF BLADDER TUMOR WITH GYRUS (TURBT-GYRUS)      Medications Prior to Admission  Medication Sig Dispense Refill Last Dose  . Cranberry-Vitamin C-Vitamin E (TH CRANBERRY CONCENTRATE PO) Take 2 tablets by mouth daily.    05/14/2018 at Unknown time  . finasteride (PROSCAR) 5 MG tablet Take 1 tablet (5 mg total) by mouth daily. pm (Patient taking differently: Take 5 mg by mouth daily. ) 30 tablet 3 05/14/2018 at Unknown time  . lisinopril (PRINIVIL,ZESTRIL) 20 MG tablet Take 20 mg by mouth daily.    05/14/2018 at Unknown time  . OVER THE COUNTER MEDICATION Take 1 tablet by mouth 2 (two) times daily. Prosta-Rye Plus   05/14/2018 at Unknown time  . tamsulosin (FLOMAX) 0.4 MG CAPS capsule  Take 1 capsule (0.4 mg total) by mouth daily. 30 capsule 3 05/14/2018 at Unknown time   Social History   Socioeconomic History  . Marital status: Married    Spouse name: Not on file  . Number of children: Not on file  . Years of education: Not on file  . Highest education level: Not on file  Occupational History  . Not on file  Social Needs  . Financial resource strain: Not on file  . Food insecurity:    Worry: Not on file    Inability: Not on file  . Transportation needs:    Medical: Not on file     Non-medical: Not on file  Tobacco Use  . Smoking status: Former Smoker    Packs/day: 2.00    Types: Cigarettes    Last attempt to quit: 01/17/1979    Years since quitting: 39.3  . Smokeless tobacco: Never Used  Substance and Sexual Activity  . Alcohol use: Yes    Comment: wine occ  . Drug use: No  . Sexual activity: Never  Lifestyle  . Physical activity:    Days per week: Not on file    Minutes per session: Not on file  . Stress: Not on file  Relationships  . Social connections:    Talks on phone: Not on file    Gets together: Not on file    Attends religious service: Not on file    Active member of club or organization: Not on file    Attends meetings of clubs or organizations: Not on file    Relationship status: Not on file  . Intimate partner violence:    Fear of current or ex partner: Not on file    Emotionally abused: Not on file    Physically abused: Not on file    Forced sexual activity: Not on file  Other Topics Concern  . Not on file  Social History Narrative  . Not on file    Family History  Problem Relation Age of Onset  . Cancer Mother       Review of systems complete and found to be negative unless listed above      PHYSICAL EXAM  General: Well developed, well nourished, in no acute distress HEENT:  Normocephalic and atramatic Neck:  No JVD.  Lungs: Clear bilaterally to auscultation and percussion. Heart: HRRR . Normal S1 and S2 without gallops or murmurs.  Abdomen: Bowel sounds are positive, abdomen soft and non-tender  Msk:  Back normal, normal gait. Normal strength and tone for age. Extremities: No clubbing, cyanosis or edema.   Neuro: Alert and oriented X 3. Psych:  Good affect, responds appropriately  Labs:   Lab Results  Component Value Date   WBC 14.8 (H) 05/14/2018   HGB 15.9 05/14/2018   HCT 46.7 05/14/2018   MCV 94.0 05/14/2018   PLT 181 05/14/2018    Recent Labs  Lab 05/14/18 2237  NA 139  K 3.7  CL 103  CO2 25  BUN  28*  CREATININE 1.64*  CALCIUM 9.1  PROT 7.6  BILITOT 0.8  ALKPHOS 59  ALT 21  AST 32  GLUCOSE 162*   Lab Results  Component Value Date   TROPONINI 0.05 (HH) 05/14/2018   No results found for: CHOL No results found for: HDL No results found for: LDLCALC No results found for: TRIG No results found for: CHOLHDL No results found for: LDLDIRECT    Radiology: Dg Chest [facility] 1 View  Result  Date: 05/14/2018 CLINICAL DATA:  Chest pain EXAM: PORTABLE CHEST 1 VIEW COMPARISON:  None. FINDINGS: The lungs are well inflated. There is mild cardiomegaly. There is no focal airspace consolidation or pulmonary edema. There is no pleural effusion or pneumothorax. IMPRESSION: No active disease. Electronically Signed   By: Ulyses Jarred M.D.   On: 05/14/2018 23:36    EKG: Normal sinus rhythm with diagnostic ST elevation in leads V2 3 and 4  ASSESSMENT AND PLAN:   1.  Anterior ST elevation myocardial infarction  Recommendations  1.  Emergent cardiac catheterization, and probable primary PCI  Signed: Isaias Cowman MD,PhD, [facility] 05/15/2018, 12:24 AM

## 2018-05-15 NOTE — Progress Notes (Signed)
Patient ambulated to new room 247 with [name], NT at side.  Patient on tele monitor for transfer to new room.

## 2018-05-16 ENCOUNTER — Encounter: Payer: Self-pay | Admitting: Cardiology

## 2018-05-16 LAB — BASIC METABOLIC PANEL
ANION GAP: 5 (ref 5–15)
BUN: 22 mg/dL (ref 8–23)
CHLORIDE: 108 mmol/L (ref 98–111)
CO2: 23 mmol/L (ref 22–32)
Calcium: 8.2 mg/dL — ABNORMAL LOW (ref 8.9–10.3)
Creatinine, Ser: 1.32 mg/dL — ABNORMAL HIGH (ref 0.61–1.24)
GFR calc non Af Amer: 50 mL/min — ABNORMAL LOW (ref >60)
GFR, EST AFRICAN AMERICAN: 58 mL/min — AB (ref >60)
Glucose, Bld: 119 mg/dL — ABNORMAL HIGH (ref 70–99)
Potassium: 4.1 mmol/L (ref 3.5–5.1)
SODIUM: 136 mmol/L (ref 135–145)

## 2018-05-16 LAB — CBC
HCT: 41.4 % (ref 40.0–52.0)
HEMOGLOBIN: 14 g/dL (ref 13.0–18.0)
MCH: 31.8 pg (ref 26.0–34.0)
MCHC: 33.8 g/dL (ref 32.0–36.0)
MCV: 94.2 fL (ref 80.0–100.0)
Platelets: 174 10*3/uL (ref 150–440)
RBC: 4.39 MIL/uL — ABNORMAL LOW (ref 4.40–5.90)
RDW: 12.7 % (ref 11.5–14.5)
WBC: 15.7 10*3/uL — ABNORMAL HIGH (ref 3.8–10.6)

## 2018-05-16 LAB — GLUCOSE, CAPILLARY: GLUCOSE-CAPILLARY: 160 mg/dL — AB (ref 70–99)

## 2018-05-16 MED ORDER — ASPIRIN 81 MG PO CHEW: 81.0000 mg | CHEWABLE_TABLET | Freq: Every day | ORAL | Status: AC

## 2018-05-16 MED ORDER — TICAGRELOR 90 MG PO TABS: 90.0000 mg | ORAL_TABLET | Freq: Two times a day (BID) | ORAL | 0 refills | Status: AC

## 2018-05-16 MED ORDER — METOPROLOL TARTRATE 25 MG PO TABS: 25.0000 mg | ORAL_TABLET | Freq: Two times a day (BID) | ORAL | 0 refills | Status: AC

## 2018-05-16 MED ORDER — ATORVASTATIN CALCIUM 80 MG PO TABS: 80.0000 mg | ORAL_TABLET | Freq: Every day | ORAL | 0 refills | Status: AC

## 2018-05-16 NOTE — Progress Notes (Signed)
Justin [name] to be D/C'd Home per MD order.  Discussed prescriptions and follow up appointments with the patient. Prescriptions given to patient, medication list explained in detail. Pt verbalized understanding. Wife at bedside  Allergies as of 05/16/2018      Reactions   Oxycodone-acetaminophen Hives      Medication List    TAKE these medications   aspirin 81 MG chewable tablet Chew 1 tablet (81 mg total) by mouth daily. Start taking on:  05/17/2018   atorvastatin 80 MG tablet Commonly known as:  LIPITOR Take 1 tablet (80 mg total) by mouth daily at 6 PM.   finasteride 5 MG tablet Commonly known as:  PROSCAR Take 1 tablet (5 mg total) by mouth daily. pm What changed:  additional instructions   lisinopril 20 MG tablet Commonly known as:  PRINIVIL,ZESTRIL Take 20 mg by mouth daily.   metoprolol tartrate 25 MG tablet Commonly known as:  LOPRESSOR Take 1 tablet (25 mg total) by mouth 2 (two) times daily.   OVER THE COUNTER MEDICATION Take 1 tablet by mouth 2 (two) times daily. Prosta-Rye Plus   tamsulosin 0.4 MG Caps capsule Commonly known as:  FLOMAX Take 1 capsule (0.4 mg total) by mouth daily.   TH CRANBERRY CONCENTRATE PO Take 2 tablets by mouth daily.   ticagrelor 90 MG Tabs tablet Commonly known as:  BRILINTA Take 1 tablet (90 mg total) by mouth 2 (two) times daily.       Vitals:   05/15/18 1924 05/16/18 0804  BP: 97/65 129/90  Pulse: 79 (!) 105  Resp: 16 18  Temp: 98.6 F (37 C) 98.5 F (36.9 C)  SpO2:  95%    Tele box removed and returned. Skin clean, dry and intact without evidence of skin break down, no evidence of skin tears noted. IV catheter discontinued intact. Site without signs and symptoms of complications. Dressing and pressure applied. Pt denies pain at this time. No complaints noted.  An After Visit Summary was printed and given to the patient. Patient escorted via [place], and D/C home via private auto.  [name]

## 2018-05-16 NOTE — Plan of Care (Signed)
Nutrition Education Note  RD consulted for nutrition education regarding a Heart Healthy diet.   RD provided "Heart Healthy Nutrition Therapy" handout from the Academy of Nutrition and Dietetics. Reviewed patient's dietary recall. Provided examples on ways to decrease sodium and fat intake in diet. Discouraged intake of processed foods and use of salt shaker. Encouraged fresh fruits and vegetables as well as whole grain sources of carbohydrates to maximize fiber intake. Teach back method used.  Expect good compliance.  Body mass index is 28.63 kg/m. Pt meets criteria for overweight based on current BMI.  Current diet order is HH, patient is consuming approximately 100% of meals at this time. Labs and medications reviewed. No further nutrition interventions warranted at this time. RD contact information provided. If additional nutrition issues arise, please re-consult RD.  Koleen Distance MS, RD, LDN Pager #- [phone] Office#- [phone] After Hours Pager: [phone]

## 2018-05-16 NOTE — Care Management (Signed)
Patient to discharge on Brilinta. He verbally confirms  pharmacy coverage with his medicare policy.   Provided with coupon for 30 day tril.  No discharge needs identified by members of the care team

## 2018-05-16 NOTE — Discharge Summary (Signed)
[place] at [place] NAME: Justin [name]    MR#:  [id number]  DATE OF BIRTH:  [birth year]  DATE OF ADMISSION:  05/14/2018 ADMITTING PHYSICIAN: Isaias Cowman, MD  DATE OF DISCHARGE: 05/16/2018  PRIMARY CARE PHYSICIAN: [name], PA-C    ADMISSION DIAGNOSIS:  ST elevation myocardial infarction (STEMI), unspecified artery (HCC) [I21.3] Acute ST elevation myocardial infarction (STEMI) involving left anterior descending (LAD) coronary artery (HCC) [I21.02]  DISCHARGE DIAGNOSIS:  Active Problems:   Acute ST elevation myocardial infarction (STEMI) involving left anterior descending (LAD) coronary artery (HCC)   STEMI (ST elevation myocardial infarction) ([place])   SECONDARY DIAGNOSIS:   Past Medical History:  Diagnosis Date  . Arthritis   . Bell's palsy   . Bell's palsy   . BPH (benign prostatic hyperplasia)   . Cancer ([place])    bladder  . Deaf    right ear  . Dizziness   . History of urinary urgency   . Hydronephrosis, bilateral   . Hypertension   . Kidney stone   . Neuroma    acoustic  . 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 Urinary frequency   . Urinary tract infection     HOSPITAL COURSE:    77 year old male with past medical history significant for right-sided Bell's palsy, history of accoustic neuroma, hypertension, diabetes presents to hospital secondary to chest pain and noted to have STEMI.  1. Anterior wall STEMI on presentation: He is status post emergency cardiac catheterizationwith 99% LAD occlusion and DES placed. HE will need to continue aspirin and Brilinta, metoprolol and statun. He is referred to cardiac rehab at discharge. And will follow up with cardiology in 1 week. LV gram showing 45 to 50% LV function, with apical hypokinesis  2.  Hypertension: Continue lisinopril, add metoprolol  3.  BPH-on Flomax and finasteride  DISCHARGE CONDITIONS AND DIET:  'stable cardiac  CONSULTS OBTAINED:   Treatment Team:  Isaias Cowman, MD  DRUG ALLERGIES:   Allergies  Allergen Reactions  . Oxycodone-Acetaminophen Hives    DISCHARGE MEDICATIONS:   Allergies as of 05/16/2018      Reactions   Oxycodone-acetaminophen Hives      Medication List    TAKE these medications   aspirin 81 MG chewable tablet Chew 1 tablet (81 mg total) by mouth daily. Start taking on:  05/17/2018   atorvastatin 80 MG tablet Commonly known as:  LIPITOR Take 1 tablet (80 mg total) by mouth daily at 6 PM.   finasteride 5 MG tablet Commonly known as:  PROSCAR Take 1 tablet (5 mg total) by mouth daily. pm What changed:  additional instructions   lisinopril 20 MG tablet Commonly known as:  PRINIVIL,ZESTRIL Take 20 mg by mouth daily.   metoprolol tartrate 25 MG tablet Commonly known as:  LOPRESSOR Take 1 tablet (25 mg total) by mouth 2 (two) times daily.   OVER THE COUNTER MEDICATION Take 1 tablet by mouth 2 (two) times daily. Prosta-Rye Plus   tamsulosin 0.4 MG Caps capsule Commonly known as:  FLOMAX Take 1 capsule (0.4 mg total) by mouth daily.   TH CRANBERRY CONCENTRATE PO Take 2 tablets by mouth daily.   ticagrelor 90 MG Tabs tablet Commonly known as:  BRILINTA Take 1 tablet (90 mg total) by mouth 2 (two) times daily.         Today   CHIEF COMPLAINT:  No CP/SPB doing well this am   VITAL SIGNS:  Blood pressure  129/90, pulse (!) 105, temperature 98.5 F (36.9 C), temperature source Oral, resp. rate 18, height 5\' 10"  (1.778 m), weight 90.5 kg, SpO2 95 %.   REVIEW OF SYSTEMS:  Review of Systems  Constitutional: Negative.  Negative for chills, fever and malaise/fatigue.  HENT: Negative.  Negative for ear discharge, ear pain, hearing loss, nosebleeds and sore throat.   Eyes: Negative.  Negative for blurred vision and pain.  Respiratory: Negative.  Negative for cough, hemoptysis, shortness of breath and wheezing.   Cardiovascular: Negative.  Negative for chest pain,  palpitations and leg swelling.  Gastrointestinal: Negative.  Negative for abdominal pain, blood in stool, diarrhea, nausea and vomiting.  Genitourinary: Negative.  Negative for dysuria.  Musculoskeletal: Negative.  Negative for back pain.  Skin: Negative.   Neurological: Negative for dizziness, tremors, speech change, focal weakness, seizures and headaches.  Endo/Heme/Allergies: Negative.  Does not bruise/bleed easily.  Psychiatric/Behavioral: Negative.  Negative for depression, hallucinations and suicidal ideas.     PHYSICAL EXAMINATION:  GENERAL:  77 y.o.-year-old patient lying in the bed with no acute distress.  NECK:  Supple, no jugular venous distention. No thyroid enlargement, no tenderness.  LUNGS: Normal breath sounds bilaterally, no wheezing, rales,rhonchi  No use of accessory muscles of respiration.  CARDIOVASCULAR: S1, S2 normal. No murmurs, rubs, or gallops.  ABDOMEN: Soft, non-tender, non-distended. Bowel sounds present. No organomegaly or mass.  EXTREMITIES: No pedal edema, cyanosis, or clubbing.  PSYCHIATRIC: The patient is alert and oriented x 3.  SKIN: briusing right groin  DATA REVIEW:   CBC Recent Labs  Lab 05/16/18 0140  WBC 15.7*  HGB 14.0  HCT 41.4  PLT 174    Chemistries  Recent Labs  Lab 05/14/18 2237  05/15/18 0654 05/16/18 0140  NA 139   < > 139 136  K 3.7   < > 4.0 4.1  CL 103   < > 107 108  CO2 25   < > 22 23  GLUCOSE 162*   < > 118* 119*  BUN 28*   < > 28* 22  CREATININE 1.64*   < > 1.25* 1.32*  CALCIUM 9.1   < > 8.3* 8.2*  MG  --    < > 2.1  --   AST 32  --   --   --   ALT 21  --   --   --   ALKPHOS 59  --   --   --   BILITOT 0.8  --   --   --    < > = values in this interval not displayed.    Cardiac Enzymes Recent Labs  Lab 05/15/18 0224 05/15/18 0654 05/15/18 1507  TROPONINI 14.59* 55.72* 38.26*    Microbiology Results  @[email or web address] @  RADIOLOGY:  Dg Chest Port 1 View  Result Date: 05/14/2018 CLINICAL DATA:  Chest  pain EXAM: PORTABLE CHEST 1 VIEW COMPARISON:  None. FINDINGS: The lungs are well inflated. There is mild cardiomegaly. There is no focal airspace consolidation or pulmonary edema. There is no pleural effusion or pneumothorax. IMPRESSION: No active disease. Electronically Signed   By: Ulyses Jarred M.D.   On: 05/14/2018 23:36      Allergies as of 05/16/2018      Reactions   Oxycodone-acetaminophen Hives      Medication List    TAKE these medications   aspirin 81 MG chewable tablet Chew 1 tablet (81 mg total) by mouth daily. Start taking on:  05/17/2018   atorvastatin 80 MG tablet  Commonly known as:  LIPITOR Take 1 tablet (80 mg total) by mouth daily at 6 PM.   finasteride 5 MG tablet Commonly known as:  PROSCAR Take 1 tablet (5 mg total) by mouth daily. pm What changed:  additional instructions   lisinopril 20 MG tablet Commonly known as:  PRINIVIL,ZESTRIL Take 20 mg by mouth daily.   metoprolol tartrate 25 MG tablet Commonly known as:  LOPRESSOR Take 1 tablet (25 mg total) by mouth 2 (two) times daily.   OVER THE COUNTER MEDICATION Take 1 tablet by mouth 2 (two) times daily. Prosta-Rye Plus   tamsulosin 0.4 MG Caps capsule Commonly known as:  FLOMAX Take 1 capsule (0.4 mg total) by mouth daily.   TH CRANBERRY CONCENTRATE PO Take 2 tablets by mouth daily.   ticagrelor 90 MG Tabs tablet Commonly known as:  BRILINTA Take 1 tablet (90 mg total) by mouth 2 (two) times daily.         Management plans discussed with the patient and he is in agreement. Stable for discharge home  Patient should follow up with cardiology  CODE STATUS:     Code Status Orders  (From admission, onward)         Start     Ordered   05/15/18 0401  Full code  Continuous     05/15/18 0400        Code Status History    Date Active Date Inactive Code Status Order ID Comments User Context   05/15/2018 0027 05/15/2018 0400 Full Code [id number]  Isaias Cowman, MD Inpatient       TOTAL TIME TAKING CARE OF THIS PATIENT: 38 minutes.    Note: This dictation was prepared with Dragon dictation along with smaller phrase technology. Any transcriptional errors that result from this process are unintentional.  Christphor Groft M.D on 05/16/2018 at 10:14 AM  Between 7am to 6pm - Pager - [phone] After 6pm go to [email or web address] - password [email or web address] [place] [facility]  Office  [phone]  CC: Primary care physician; [name], PA-C

## 2018-05-16 NOTE — Progress Notes (Signed)
[facility]  SUBJECTIVE: The patient denies chest pain or shortness of breath. He states that he feels agitated because he had very little sleep last night due to having labs drawn throughout the night. He states that he is ready to go home.   Vitals:   05/15/18 1800 05/15/18 1915 05/15/18 1924 05/16/18 0804  BP: (!) 100/54  97/65 129/90  Pulse: 73  79 (!) 105  Resp: 20  16 18   Temp:   98.6 F (37 C) 98.5 F (36.9 C)  TempSrc:   Oral Oral  SpO2: 96%   95%  Weight:  90.5 kg    Height:  5\' 10"  (1.778 m)       Intake/Output Summary (Last 24 hours) at 05/16/2018 0902 Last data filed at 05/16/2018 0800 Gross per 24 hour  Intake 1573.25 ml  Output 1825 ml  Net -251.75 ml      PHYSICAL EXAM  General: Well developed, well nourished, in no acute distress, standing beside bed HEENT:  Normocephalic and atramatic Neck:  No JVD.  Lungs: Clear bilaterally to auscultation, normal effort of breathing. Heart: Rhythm regular, tachycardic . Normal S1 and S2 without gallops or murmurs.  Abdomen: Bowel sounds present Msk:  Back normal, gait not assessed. Muscular tone appears normal for age. Extremities: No clubbing, cyanosis or edema. Right upper leg with diffuse ecchymosis without hematoma Neuro: Alert and oriented X 3. Psych:  Appears agitated, responds appropriately   LABS: Basic Metabolic Panel: Recent Labs    05/15/18 0224 05/15/18 0654 05/16/18 0140  NA 138 139 136  K 4.0 4.0 4.1  CL 105 107 108  CO2 25 22 23   GLUCOSE 135* 118* 119*  BUN 28* 28* 22  CREATININE 1.49* 1.25* 1.32*  CALCIUM 8.5* 8.3* 8.2*  MG 2.2 2.1  --   PHOS  --  2.6  --    Liver Function Tests: Recent Labs    05/14/18 2237  AST 32  ALT 21  ALKPHOS 59  BILITOT 0.8  PROT 7.6  ALBUMIN 4.3   No results for input(s): LIPASE, AMYLASE in the last 72 hours. CBC: Recent Labs    05/15/18 0654 05/15/18 1507 05/16/18 0140  WBC 12.5*  --  15.7*  NEUTROABS 8.8*  --   --   HGB 13.9 13.6 14.0  HCT  41.3 39.8* 41.4  MCV 95.1  --  94.2  PLT 171  --  174   Cardiac Enzymes: Recent Labs    05/15/18 0224 05/15/18 0654 05/15/18 1507  TROPONINI 14.59* 55.72* 38.26*   BNP: Invalid input(s): POCBNP D-Dimer: No results for input(s): DDIMER in the last 72 hours. Hemoglobin A1C: Recent Labs    05/15/18 0654  HGBA1C 6.2*   Fasting Lipid Panel: No results for input(s): CHOL, HDL, LDLCALC, TRIG, CHOLHDL, LDLDIRECT in the last 72 hours. Thyroid Function Tests: Recent Labs    05/15/18 0654  TSH 1.880   Anemia Panel: No results for input(s): VITAMINB12, FOLATE, FERRITIN, TIBC, IRON, RETICCTPCT in the last 72 hours.  Dg Chest Port 1 View  Result Date: 05/14/2018 CLINICAL DATA:  Chest pain EXAM: PORTABLE CHEST 1 VIEW COMPARISON:  None. FINDINGS: The lungs are well inflated. There is mild cardiomegaly. There is no focal airspace consolidation or pulmonary edema. There is no pleural effusion or pneumothorax. IMPRESSION: No active disease. Electronically Signed   By: Ulyses Jarred M.D.   On: 05/14/2018 23:36     Echo: Pending  TELEMETRY: Sinus rhythm, 101 bpm  ASSESSMENT AND PLAN:  Active Problems:   Acute ST elevation myocardial infarction (STEMI) involving left anterior descending (LAD) coronary artery (HCC)   STEMI (ST elevation myocardial infarction) ([place])    1. Acute anterior ST elevation myocardial infarction on 05/15/18, status post primary PCI, with DES ostium LAD. The patient is without recurrent chest pain. 2. Essential hypertension, adequately controlled this morning  Recommendations: 1. 2D echocardiogram pending 2. Continue Brilinta and aspirin uninterrupted for a year. 3. Continue lisinopril, metoprolol tartrate, and high dose atorvastatin. 4. Enroll in cardiac rehab 5. Make appointment with Dr. Saralyn Pilar for 1 week 6. Patient is stable to be discharged today from a cardiac stand-point.   Clabe Seal, PA-C 05/16/2018 9:02 AM

## 2018-05-16 NOTE — Plan of Care (Signed)

## 2018-05-23 DIAGNOSIS — I2102 ST elevation (STEMI) myocardial infarction involving left anterior descending coronary artery: Secondary | ICD-10-CM | POA: Diagnosis not present

## 2018-05-23 DIAGNOSIS — I10 Essential (primary) hypertension: Secondary | ICD-10-CM | POA: Diagnosis not present

## 2018-05-23 DIAGNOSIS — Z955 Presence of coronary angioplasty implant and graft: Secondary | ICD-10-CM | POA: Diagnosis not present

## 2018-05-24 DIAGNOSIS — I2510 Atherosclerotic heart disease of native coronary artery without angina pectoris: Secondary | ICD-10-CM | POA: Diagnosis not present

## 2018-05-24 DIAGNOSIS — I10 Essential (primary) hypertension: Secondary | ICD-10-CM | POA: Diagnosis not present

## 2018-05-24 DIAGNOSIS — Z79899 Other long term (current) drug therapy: Secondary | ICD-10-CM | POA: Diagnosis not present

## 2018-05-24 DIAGNOSIS — N400 Enlarged prostate without lower urinary tract symptoms: Secondary | ICD-10-CM | POA: Diagnosis not present

## 2018-05-24 DIAGNOSIS — R7303 Prediabetes: Secondary | ICD-10-CM | POA: Diagnosis not present

## 2018-05-26 ENCOUNTER — Encounter: Payer: Medicare Other | Attending: Cardiology | Admitting: *Deleted

## 2018-05-26 VITALS — Ht 67.0 in | Wt 195.8 lb

## 2018-05-26 DIAGNOSIS — I213 ST elevation (STEMI) myocardial infarction of unspecified site: Secondary | ICD-10-CM | POA: Insufficient documentation

## 2018-05-26 DIAGNOSIS — Z8551 Personal history of malignant neoplasm of bladder: Secondary | ICD-10-CM | POA: Insufficient documentation

## 2018-05-26 DIAGNOSIS — Z955 Presence of coronary angioplasty implant and graft: Secondary | ICD-10-CM | POA: Diagnosis not present

## 2018-05-26 DIAGNOSIS — M1990 Unspecified osteoarthritis, unspecified site: Secondary | ICD-10-CM | POA: Diagnosis not present

## 2018-05-26 DIAGNOSIS — Z7902 Long term (current) use of antithrombotics/antiplatelets: Secondary | ICD-10-CM | POA: Insufficient documentation

## 2018-05-26 DIAGNOSIS — Z87891 Personal history of nicotine dependence: Secondary | ICD-10-CM | POA: Diagnosis not present

## 2018-05-26 DIAGNOSIS — Z87442 Personal history of urinary calculi: Secondary | ICD-10-CM | POA: Insufficient documentation

## 2018-05-26 DIAGNOSIS — H9191 Unspecified hearing loss, right ear: Secondary | ICD-10-CM | POA: Insufficient documentation

## 2018-05-26 DIAGNOSIS — N400 Enlarged prostate without lower urinary tract symptoms: Secondary | ICD-10-CM | POA: Diagnosis not present

## 2018-05-26 DIAGNOSIS — I10 Essential (primary) hypertension: Secondary | ICD-10-CM | POA: Insufficient documentation

## 2018-05-26 DIAGNOSIS — Z7982 Long term (current) use of aspirin: Secondary | ICD-10-CM | POA: Diagnosis not present

## 2018-05-26 DIAGNOSIS — Z79899 Other long term (current) drug therapy: Secondary | ICD-10-CM | POA: Insufficient documentation

## 2018-05-26 NOTE — Patient Instructions (Signed)
Patient Instructions  Patient Details  Name: Justin [name] MRN: [id number] Date of Birth: Jun 24, 1941 Referring Provider:  Isaias Cowman, MD  Below are your personal goals for exercise, nutrition, and risk factors. Our goal is to help you stay on track towards obtaining and maintaining these goals. We will be discussing your progress on these goals with you throughout the program.  Initial Exercise Prescription: Initial Exercise Prescription - 05/26/18 1200      Date of Initial Exercise RX and Referring Provider   Date  05/26/18    Referring Provider  Paraschos      Treadmill   MPH  1.3    Grade  0    Minutes  15    METs  2      NuStep   Level  2    SPM  80    Minutes  15    METs  2      REL-XR   Level  2    Speed  50    Minutes  15    METs  2      Prescription Details   Frequency (times per week)  3    Duration  Progress to 45 minutes of aerobic exercise without signs/symptoms of physical distress      Intensity   THRR 40-80% of Max Heartrate  94-126    Ratings of Perceived Exertion  11-13    Perceived Dyspnea  0-4      Resistance Training   Training Prescription  Yes    Weight  3 lb    Reps  10-15       Exercise Goals: Frequency: Be able to perform aerobic exercise two to three times per week in program working toward 2-5 days per week of home exercise.  Intensity: Work with a perceived exertion of 11 (fairly light) - 15 (hard) while following your exercise prescription.  We will make changes to your prescription with you as you progress through the program.   Duration: Be able to do 30 to 45 minutes of continuous aerobic exercise in addition to a 5 minute warm-up and a 5 minute cool-down routine.   Nutrition Goals: Your personal nutrition goals will be established when you do your nutrition analysis with the dietician.  The following are general nutrition guidelines to follow: Cholesterol < 200mg /day Sodium < 1500mg /day Fiber: Men over 50 yrs -  30 grams per day  Personal Goals: Personal Goals and Risk Factors at Admission - 05/26/18 1300      Core Components/Risk Factors/Patient Goals on Admission    Weight Management  Yes;Weight Loss    Intervention  Weight Management: Develop a combined nutrition and exercise program designed to reach desired caloric intake, while maintaining appropriate intake of nutrient and fiber, sodium and fats, and appropriate energy expenditure required for the weight goal.;Weight Management: Provide education and appropriate resources to help participant work on and attain dietary goals.;Weight Management/Obesity: Establish reasonable short term and long term weight goals.    Admit Weight  195 lb 12.8 oz (88.8 kg)    Goal Weight: Short Term  190 lb (86.2 kg)    Goal Weight: Long Term  190 lb (86.2 kg)    Expected Outcomes  Short Term: Continue to assess and modify interventions until short term weight is achieved;Long Term: Adherence to nutrition and physical activity/exercise program aimed toward attainment of established weight goal;Weight Loss: Understanding of general recommendations for a balanced deficit meal plan, which promotes 1-2 lb weight loss  per week and includes a negative energy balance of [phone] kcal/d;Understanding recommendations for meals to include 15-35% energy as protein, 25-35% energy from fat, 35-60% energy from carbohydrates, less than 200mg  of dietary cholesterol, 20-35 gm of total fiber daily;Understanding of distribution of calorie intake throughout the day with the consumption of 4-5 meals/snacks    Hypertension  Yes    Intervention  Provide education on lifestyle modifcations including regular physical activity/exercise, weight management, moderate sodium restriction and increased consumption of fresh fruit, vegetables, and low fat dairy, alcohol moderation, and smoking cessation.;Monitor prescription use compliance.    Expected Outcomes  Short Term: Continued assessment and  intervention until BP is < 140/55mm HG in hypertensive participants. < 130/30mm HG in hypertensive participants with diabetes, heart failure or chronic kidney disease.;Long Term: Maintenance of blood pressure at goal levels.    Lipids  Yes    Intervention  Provide education and support for participant on nutrition & aerobic/resistive exercise along with prescribed medications to achieve LDL 70mg , HDL >40mg .    Expected Outcomes  Short Term: Participant states understanding of desired cholesterol values and is compliant with medications prescribed. Participant is following exercise prescription and nutrition guidelines.;Long Term: Cholesterol controlled with medications as prescribed, with individualized exercise RX and with personalized nutrition plan. Value goals: LDL < 70mg , HDL > 40 mg.       Tobacco Use Initial Evaluation: Social History   Tobacco Use  Smoking Status Former Smoker  . Packs/day: 2.00  . Types: Cigarettes  . Last attempt to quit: 01/17/1979  . Years since quitting: 39.3  Smokeless Tobacco Never Used    Exercise Goals and Review: Exercise Goals    Row Name 05/26/18 1242             Exercise Goals   Increase Physical Activity  Yes       Intervention  Provide advice, education, support and counseling about physical activity/exercise needs.;Develop an individualized exercise prescription for aerobic and resistive training based on initial evaluation findings, risk stratification, comorbidities and participant's personal goals.       Expected Outcomes  Short Term: Attend rehab on a regular basis to increase amount of physical activity.;Long Term: Add in home exercise to make exercise part of routine and to increase amount of physical activity.;Long Term: Exercising regularly at least 3-5 days a week.       Increase Strength and Stamina  Yes       Intervention  Provide advice, education, support and counseling about physical activity/exercise needs.;Develop an  individualized exercise prescription for aerobic and resistive training based on initial evaluation findings, risk stratification, comorbidities and participant's personal goals.       Expected Outcomes  Short Term: Increase workloads from initial exercise prescription for resistance, speed, and METs.;Short Term: Perform resistance training exercises routinely during rehab and add in resistance training at home;Long Term: Improve cardiorespiratory fitness, muscular endurance and strength as measured by increased METs and functional capacity (6MWT)       Able to understand and use rate of perceived exertion (RPE) scale  Yes       Intervention  Provide education and explanation on how to use RPE scale       Expected Outcomes  Short Term: Able to use RPE daily in rehab to express subjective intensity level;Long Term:  Able to use RPE to guide intensity level when exercising independently       Able to understand and use Dyspnea scale  Yes  Intervention  Provide education and explanation on how to use Dyspnea scale       Expected Outcomes  Short Term: Able to use Dyspnea scale daily in rehab to express subjective sense of shortness of breath during exertion;Long Term: Able to use Dyspnea scale to guide intensity level when exercising independently       Knowledge and understanding of Target Heart Rate Range (THRR)  Yes       Intervention  Provide education and explanation of THRR including how the numbers were predicted and where they are located for reference       Expected Outcomes  Short Term: Able to state/look up THRR;Short Term: Able to use daily as guideline for intensity in rehab;Long Term: Able to use THRR to govern intensity when exercising independently       Able to check pulse independently  Yes       Intervention  Provide education and demonstration on how to check pulse in carotid and radial arteries.;Review the importance of being able to check your own pulse for safety during  independent exercise       Expected Outcomes  Short Term: Able to explain why pulse checking is important during independent exercise;Long Term: Able to check pulse independently and accurately       Understanding of Exercise Prescription  Yes       Intervention  Provide education, explanation, and written materials on patient's individual exercise prescription       Expected Outcomes  Short Term: Able to explain program exercise prescription;Long Term: Able to explain home exercise prescription to exercise independently          Copy of goals given to participant.

## 2018-05-26 NOTE — Progress Notes (Signed)
Daily Session Note  Patient Details  Name: Justin [name] MRN: [id number] Date of Birth: Jul 20, 1941 Referring Provider:     Cardiac Rehab from 05/26/2018 in [facility]  Referring Provider  Paraschos      Encounter Date: 05/26/2018  Check In: Session Check In - 05/26/18 1250      Check-In   Supervising physician immediately available to respond to emergencies  See telemetry face sheet for immediately available ER MD    Location  [facility]    Staff Present  Renita Papa, RN Vickki Hearing, BA, ACSM CEP, Exercise Physiologist    Tobacco Cessation  --   quit in [place] and [place]  Not performed (comment)    med review completed   Resistance Training Performed  Yes    VAD Patient?  No    PAD/SET Patient?  No      Pain Assessment   Currently in Pain?  No/denies        Exercise Prescription Changes - 05/26/18 1200      Response to Exercise   Blood Pressure (Admit)  104/56    Blood Pressure (Exercise)  134/60    Blood Pressure (Exit)  100/58    Heart Rate (Admit)  67 bpm    Heart Rate (Exercise)  103 bpm    Heart Rate (Exit)  59 bpm    Oxygen Saturation (Admit)  96 %    Oxygen Saturation (Exercise)  97 %    Oxygen Saturation (Exit)  98 %    Rating of Perceived Exertion (Exercise)  11       Social History   Tobacco Use  Smoking Status Former Smoker  . Packs/day: 2.00  . Types: Cigarettes  . Last attempt to quit: 01/17/1979  . Years since quitting: 39.3  Smokeless Tobacco Never Used    Goals Met:  Proper associated with RPD/PD & O2 Sat Exercise tolerated well Personal goals reviewed No report of cardiac concerns or symptoms Strength training completed today  Goals Unmet:  Not Applicable  Comments: Med Review completed    Dr. Emily Filbert is Medical Director for [place] and [facility].

## 2018-05-26 NOTE — Progress Notes (Signed)
Cardiac Individual Treatment Plan  Patient Details  Name: Justin [name] MRN: [id number] Date of Birth: [birth year] Referring Provider:     Cardiac Rehab from 05/26/2018 in [facility] Cardiac and Pulmonary Rehab  Referring Provider  Paraschos      Initial Encounter Date:    Cardiac Rehab from 05/26/2018 in [facility] Cardiac and Pulmonary Rehab  Date  05/26/18      Visit Diagnosis: ST elevation myocardial infarction (STEMI), unspecified artery ([place])  S/P coronary artery stent placement  Patient's Home Medications on Admission:  Current Outpatient Medications:  .  aspirin 81 MG chewable tablet, Chew 1 tablet (81 mg total) by mouth daily., Disp: , Rfl:  .  atorvastatin (LIPITOR) 80 MG tablet, Take 1 tablet (80 mg total) by mouth daily at 6 PM., Disp: 30 tablet, Rfl: 0 .  Cranberry-Vitamin C-Vitamin E (TH CRANBERRY CONCENTRATE PO), Take 2 tablets by mouth daily. , Disp: , Rfl:  .  lisinopril (PRINIVIL,ZESTRIL) 20 MG tablet, Take 20 mg by mouth daily. , Disp: , Rfl:  .  metoprolol tartrate (LOPRESSOR) 25 MG tablet, Take 1 tablet (25 mg total) by mouth 2 (two) times daily., Disp: 60 tablet, Rfl: 0 .  OVER THE COUNTER MEDICATION, Take 1 tablet by mouth 2 (two) times daily. Prosta-Rye Plus, Disp: , Rfl:  .  tamsulosin (FLOMAX) 0.4 MG CAPS capsule, Take 1 capsule (0.4 mg total) by mouth daily., Disp: 30 capsule, Rfl: 3 .  ticagrelor (BRILINTA) 90 MG TABS tablet, Take 1 tablet (90 mg total) by mouth 2 (two) times daily., Disp: 60 tablet, Rfl: 0 .  finasteride (PROSCAR) 5 MG tablet, Take 1 tablet (5 mg total) by mouth daily. pm (Patient not taking: Reported on 05/26/2018), Disp: 30 tablet, Rfl: 3  Past Medical History: Past Medical History:  Diagnosis Date  . Arthritis   . Bell's palsy   . Bell's palsy   . BPH (benign prostatic hyperplasia)   . Cancer ([place])    bladder  . Deaf    right ear  . Dizziness   . History of urinary urgency   . Hydronephrosis, bilateral   . Hypertension   . Kidney stone    . Neuroma    acoustic  . 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 Urinary frequency   . Urinary tract infection     Tobacco Use: Social History   Tobacco Use  Smoking Status Former Smoker  . Packs/day: 2.00  . Types: Cigarettes  . Last attempt to quit: 01/17/1979  . Years since quitting: 39.3  Smokeless Tobacco Never Used    Labs: Recent Chemical engineer    Labs for ITP Cardiac and Pulmonary Rehab Latest Ref Rng & Units 05/15/2018   Hemoglobin A1c 4.8 - 5.6 % 6.2(H)       Exercise Target Goals: Exercise Program Goal: Individual exercise prescription set using results from initial 6 min walk test and THRR while considering  patient's activity barriers and safety.   Exercise Prescription Goal: Initial exercise prescription builds to 30-45 minutes a day of aerobic activity, 2-3 days per week.  Home exercise guidelines will be given to patient during program as part of exercise prescription that the participant will acknowledge.  Activity Barriers & Risk Stratification: Activity Barriers & Cardiac Risk Stratification - 05/26/18 1324      Activity Barriers & Cardiac Risk Stratification   Activity Barriers  Balance Concerns    Cardiac Risk Stratification  Moderate       6 Minute  Walk: 6 Minute Walk    Row Name 05/26/18 1243         6 Minute Walk   Phase  Initial     Distance  1045 feet     Walk Time  6 minutes     # of Rest Breaks  0     MPH  1.98     METS  1.94     RPE  11     Perceived Dyspnea   0     VO2 Peak  6.78     Symptoms  No     Resting HR  61 bpm     Resting BP  104/56     Resting Oxygen Saturation   96 %     Exercise Oxygen Saturation  during 6 min walk  97 %     Max Ex. HR  103 bpm     Max Ex. BP  134/60     2 Minute Post BP  100/58        Oxygen Initial Assessment:   Oxygen Re-Evaluation:   Oxygen Discharge (Final Oxygen Re-Evaluation):   Initial Exercise Prescription: Initial Exercise Prescription - 05/26/18 1200      Date  of Initial Exercise RX and Referring Provider   Date  05/26/18    Referring Provider  Paraschos      Treadmill   MPH  1.3    Grade  0    Minutes  15    METs  2      NuStep   Level  2    SPM  80    Minutes  15    METs  2      REL-XR   Level  2    Speed  50    Minutes  15    METs  2      Prescription Details   Frequency (times per week)  3    Duration  Progress to 45 minutes of aerobic exercise without signs/symptoms of physical distress      Intensity   THRR 40-80% of Max Heartrate  94-126    Ratings of Perceived Exertion  11-13    Perceived Dyspnea  0-4      Resistance Training   Training Prescription  Yes    Weight  3 lb    Reps  10-15       Perform Capillary Blood Glucose checks as needed.  Exercise Prescription Changes: Exercise Prescription Changes    Row Name 05/26/18 1200             Response to Exercise   Blood Pressure (Admit)  104/56       Blood Pressure (Exercise)  134/60       Blood Pressure (Exit)  100/58       Heart Rate (Admit)  67 bpm       Heart Rate (Exercise)  103 bpm       Heart Rate (Exit)  59 bpm       Oxygen Saturation (Admit)  96 %       Oxygen Saturation (Exercise)  97 %       Oxygen Saturation (Exit)  98 %       Rating of Perceived Exertion (Exercise)  11          Exercise Comments:   Exercise Goals and Review: Exercise Goals    Row Name 05/26/18 1242             Exercise  Goals   Increase Physical Activity  Yes       Intervention  Provide advice, education, support and counseling about physical activity/exercise needs.;Develop an individualized exercise prescription for aerobic and resistive training based on initial evaluation findings, risk stratification, comorbidities and participant's personal goals.       Expected Outcomes  Short Term: Attend rehab on a regular basis to increase amount of physical activity.;Long Term: Add in home exercise to make exercise part of routine and to increase amount of physical  activity.;Long Term: Exercising regularly at least 3-5 days a week.       Increase Strength and Stamina  Yes       Intervention  Provide advice, education, support and counseling about physical activity/exercise needs.;Develop an individualized exercise prescription for aerobic and resistive training based on initial evaluation findings, risk stratification, comorbidities and participant's personal goals.       Expected Outcomes  Short Term: Increase workloads from initial exercise prescription for resistance, speed, and METs.;Short Term: Perform resistance training exercises routinely during rehab and add in resistance training at home;Long Term: Improve cardiorespiratory fitness, muscular endurance and strength as measured by increased METs and functional capacity (6MWT)       Able to understand and use rate of perceived exertion (RPE) scale  Yes       Intervention  Provide education and explanation on how to use RPE scale       Expected Outcomes  Short Term: Able to use RPE daily in rehab to express subjective intensity level;Long Term:  Able to use RPE to guide intensity level when exercising independently       Able to understand and use Dyspnea scale  Yes       Intervention  Provide education and explanation on how to use Dyspnea scale       Expected Outcomes  Short Term: Able to use Dyspnea scale daily in rehab to express subjective sense of shortness of breath during exertion;Long Term: Able to use Dyspnea scale to guide intensity level when exercising independently       Knowledge and understanding of Target Heart Rate Range (THRR)  Yes       Intervention  Provide education and explanation of THRR including how the numbers were predicted and where they are located for reference       Expected Outcomes  Short Term: Able to state/look up THRR;Short Term: Able to use daily as guideline for intensity in rehab;Long Term: Able to use THRR to govern intensity when exercising independently       Able  to check pulse independently  Yes       Intervention  Provide education and demonstration on how to check pulse in carotid and radial arteries.;Review the importance of being able to check your own pulse for safety during independent exercise       Expected Outcomes  Short Term: Able to explain why pulse checking is important during independent exercise;Long Term: Able to check pulse independently and accurately       Understanding of Exercise Prescription  Yes       Intervention  Provide education, explanation, and written materials on patient's individual exercise prescription       Expected Outcomes  Short Term: Able to explain program exercise prescription;Long Term: Able to explain home exercise prescription to exercise independently          Exercise Goals Re-Evaluation :   Discharge Exercise Prescription (Final Exercise Prescription Changes): Exercise Prescription Changes - 05/26/18 1200  Response to Exercise   Blood Pressure (Admit)  104/56    Blood Pressure (Exercise)  134/60    Blood Pressure (Exit)  100/58    Heart Rate (Admit)  67 bpm    Heart Rate (Exercise)  103 bpm    Heart Rate (Exit)  59 bpm    Oxygen Saturation (Admit)  96 %    Oxygen Saturation (Exercise)  97 %    Oxygen Saturation (Exit)  98 %    Rating of Perceived Exertion (Exercise)  11       Nutrition:  Target Goals: Understanding of nutrition guidelines, daily intake of sodium <1533m, cholesterol <205m calories 30% from fat and 7% or less from saturated fats, daily to have 5 or more servings of fruits and vegetables.  Biometrics: Pre Biometrics - 05/26/18 1241      Pre Biometrics   Height  5' 7"  (1.702 m)    Weight  195 lb 12.8 oz (88.8 kg)    Waist Circumference  40.5 inches    Hip Circumference  43 inches    Waist to Hip Ratio  0.94 %    BMI (Calculated)  30.66        Nutrition Therapy Plan and Nutrition Goals: Nutrition Therapy & Goals - 05/26/18 1308      Intervention Plan    Intervention  Prescribe, educate and counsel regarding individualized specific dietary modifications aiming towards targeted core components such as weight, hypertension, lipid management, diabetes, heart failure and other comorbidities.;Nutrition handout(s) given to patient.    Expected Outcomes  Short Term Goal: Understand basic principles of dietary content, such as calories, fat, sodium, cholesterol and nutrients.;Short Term Goal: A plan has been developed with personal nutrition goals set during dietitian appointment.;Long Term Goal: Adherence to prescribed nutrition plan.       Nutrition Assessments: Nutrition Assessments - 05/26/18 1308      MEDFICTS Scores   Pre Score  56       Nutrition Goals Re-Evaluation:   Nutrition Goals Discharge (Final Nutrition Goals Re-Evaluation):   Psychosocial: Target Goals: Acknowledge presence or absence of significant depression and/or stress, maximize coping skills, provide positive support system. Participant is able to verbalize types and ability to use techniques and skills needed for reducing stress and depression.   Initial Review & Psychosocial Screening: Initial Psych Review & Screening - 05/26/18 1305      Initial Review   Current issues with  Current Sleep Concerns;Current Stress Concerns    Source of Stress Concerns  Unable to perform yard/household activities    Comments  Jermiah has had different surgeries, Bells Palsy (right side), and now a MI. He says he handles it well and doesn't feel too stressed out. He feels lucky to be here and wants to make sure this MI doesn't happen again. His wife and he are very supportive of each other and make a "great team"      [place]   wife     Barriers   Psychosocial barriers to participate in program  There are no identifiable barriers or psychosocial needs.;The patient should benefit from training in stress management and relaxation.      Screening  Interventions   Interventions  Encouraged to exercise;Program counselor consult;To provide support and resources with identified psychosocial needs;Provide feedback about the scores to participant    Expected Outcomes  Short Term goal: Utilizing psychosocial counselor, staff and physician to assist with identification of specific Stressors or  current issues interfering with healing process. Setting desired goal for each stressor or current issue identified.;Long Term Goal: Stressors or current issues are controlled or eliminated.;Short Term goal: Identification and review with participant of any Quality of Life or Depression concerns found by scoring the questionnaire.;Long Term goal: The participant improves quality of Life and PHQ9 Scores as seen by post scores and/or verbalization of changes       Quality of Life Scores:  Quality of Life - 05/26/18 1307      Quality of Life   Select  Quality of Life      Quality of Life Scores   Health/Function Pre  14.96 %    Socioeconomic Pre  12.6 %    Psych/Spiritual Pre  11 %    Family Pre  14.6 %    GLOBAL Pre  14.13 %      Scores of 19 and below usually indicate a poorer quality of life in these areas.  A difference of  2-3 points is a clinically meaningful difference.  A difference of 2-3 points in the total score of the Quality of Life Index has been associated with significant improvement in overall quality of life, self-image, physical symptoms, and general health in studies assessing change in quality of life.  PHQ-9: Recent Review Flowsheet Data    Depression screen [facility] 2/9 05/26/2018   Decreased Interest 2   Down, Depressed, Hopeless 0   PHQ - 2 Score 2   Altered sleeping 0   Tired, decreased energy 1   Change in appetite 1   Feeling bad or failure about yourself  1   Trouble concentrating 2   Moving slowly or fidgety/restless 0   Suicidal thoughts 0   PHQ-9 Score 7   Difficult doing work/chores Not difficult at all      Interpretation of Total Score  Total Score Depression Severity:  1-4 = Minimal depression, 5-9 = Mild depression, 10-14 = Moderate depression, 15-19 = Moderately severe depression, 20-27 = Severe depression   Psychosocial Evaluation and Intervention:   Psychosocial Re-Evaluation:   Psychosocial Discharge (Final Psychosocial Re-Evaluation):   Vocational Rehabilitation: Provide vocational rehab assistance to qualifying candidates.   Vocational Rehab Evaluation & Intervention: Vocational Rehab - 05/26/18 1305      Initial Vocational Rehab Evaluation & Intervention   Assessment shows need for Vocational Rehabilitation  No       Education: Education Goals: Education classes will be provided on a variety of topics geared toward better understanding of heart health and risk factor modification. Participant will state understanding/return demonstration of topics presented as noted by education test scores.  Learning Barriers/Preferences: Learning Barriers/Preferences - 05/26/18 1302      Learning Barriers/Preferences   Learning Barriers  Hearing    Learning Preferences  Verbal Instruction       Education Topics:  AED/CPR: - Group verbal and written instruction with the use of models to demonstrate the basic use of the AED with the basic ABC's of resuscitation.   General Nutrition Guidelines/Fats and Fiber: -Group instruction provided by verbal, written material, models and posters to present the general guidelines for heart healthy nutrition. Gives an explanation and review of dietary fats and fiber.   Controlling Sodium/Reading Food Labels: -Group verbal and written material supporting the discussion of sodium use in heart healthy nutrition. Review and explanation with models, verbal and written materials for utilization of the food label.   Exercise Physiology & General Exercise Guidelines: - Group verbal and written instruction  with models to review the exercise  physiology of the cardiovascular system and associated critical values. Provides general exercise guidelines with specific guidelines to those with heart or lung disease.    Aerobic Exercise & Resistance Training: - Gives group verbal and written instruction on the various components of exercise. Focuses on aerobic and resistive training programs and the benefits of this training and how to safely progress through these programs..   Flexibility, Balance, Mind/Body Relaxation: Provides group verbal/written instruction on the benefits of flexibility and balance training, including mind/body exercise modes such as yoga, pilates and tai chi.  Demonstration and skill practice provided.   Stress and Anxiety: - Provides group verbal and written instruction about the health risks of elevated stress and causes of high stress.  Discuss the correlation between heart/lung disease and anxiety and treatment options. Review healthy ways to manage with stress and anxiety.   Depression: - Provides group verbal and written instruction on the correlation between heart/lung disease and depressed mood, treatment options, and the stigmas associated with seeking treatment.   Anatomy & Physiology of the Heart: - Group verbal and written instruction and models provide basic cardiac anatomy and physiology, with the coronary electrical and arterial systems. Review of Valvular disease and Heart Failure   Cardiac Procedures: - Group verbal and written instruction to review commonly prescribed medications for heart disease. Reviews the medication, class of the drug, and side effects. Includes the steps to properly store meds and maintain the prescription regimen. (beta blockers and nitrates)   Cardiac Medications I: - Group verbal and written instruction to review commonly prescribed medications for heart disease. Reviews the medication, class of the drug, and side effects. Includes the steps to properly store meds  and maintain the prescription regimen.   Cardiac Medications II: -Group verbal and written instruction to review commonly prescribed medications for heart disease. Reviews the medication, class of the drug, and side effects. (all other drug classes)    Go Sex-Intimacy & Heart Disease, Get SMART - Goal Setting: - Group verbal and written instruction through game format to discuss heart disease and the return to sexual intimacy. Provides group verbal and written material to discuss and apply goal setting through the application of the S.M.A.R.T. Method.   Other Matters of the Heart: - Provides group verbal, written materials and models to describe Stable Angina and Peripheral Artery. Includes description of the disease process and treatment options available to the cardiac patient.   Exercise & Equipment Safety: - Individual verbal instruction and demonstration of equipment use and safety with use of the equipment.   Cardiac Rehab from 05/26/2018 in [facility] Cardiac and Pulmonary Rehab  Date  05/26/18  Educator  [facility]  Instruction Review Code  1- Verbalizes Understanding      Infection Prevention: - Provides verbal and written material to individual with discussion of infection control including proper hand washing and proper equipment cleaning during exercise session.   Cardiac Rehab from 05/26/2018 in [facility] Cardiac and Pulmonary Rehab  Date  05/26/18  Educator  [facility]  Instruction Review Code  1- Verbalizes Understanding      Falls Prevention: - Provides verbal and written material to individual with discussion of falls prevention and safety.   Cardiac Rehab from 05/26/2018 in [facility] Cardiac and Pulmonary Rehab  Date  05/26/18  Educator  [facility]  Instruction Review Code  1- Verbalizes Understanding      Diabetes: - Individual verbal and written instruction to review signs/symptoms of diabetes, desired ranges of  glucose level fasting, after meals and with exercise. Acknowledge that pre and post  exercise glucose checks will be done for 3 sessions at entry of program.   Know Your Numbers and Risk Factors: -Group verbal and written instruction about important numbers in your health.  Discussion of what are risk factors and how they play a role in the disease process.  Review of Cholesterol, Blood Pressure, Diabetes, and BMI and the role they play in your overall health.   Sleep Hygiene: -Provides group verbal and written instruction about how sleep can affect your health.  Define sleep hygiene, discuss sleep cycles and impact of sleep habits. Review good sleep hygiene tips.    Other: -Provides group and verbal instruction on various topics (see comments)   Knowledge Questionnaire Score: Knowledge Questionnaire Score - 05/26/18 1303      Knowledge Questionnaire Score   Pre Score  17/26   correct answers reviewed with Serapio, focus on nutrition, exercise, a&p      Core Components/Risk Factors/Patient Goals at Admission: Personal Goals and Risk Factors at Admission - 05/26/18 1300      Core Components/Risk Factors/Patient Goals on Admission    Weight Management  Yes;Weight Loss    Intervention  Weight Management: Develop a combined nutrition and exercise program designed to reach desired caloric intake, while maintaining appropriate intake of nutrient and fiber, sodium and fats, and appropriate energy expenditure required for the weight goal.;Weight Management: Provide education and appropriate resources to help participant work on and attain dietary goals.;Weight Management/Obesity: Establish reasonable short term and long term weight goals.    Admit Weight  195 lb 12.8 oz (88.8 kg)    Goal Weight: Short Term  190 lb (86.2 kg)    Goal Weight: Long Term  190 lb (86.2 kg)    Expected Outcomes  Short Term: Continue to assess and modify interventions until short term weight is achieved;Long Term: Adherence to nutrition and physical activity/exercise program aimed toward attainment of  established weight goal;Weight Loss: Understanding of general recommendations for a balanced deficit meal plan, which promotes 1-2 lb weight loss per week and includes a negative energy balance of [phone] kcal/d;Understanding recommendations for meals to include 15-35% energy as protein, 25-35% energy from fat, 35-60% energy from carbohydrates, less than 227m of dietary cholesterol, 20-35 gm of total fiber daily;Understanding of distribution of calorie intake throughout the day with the consumption of 4-5 meals/snacks    Hypertension  Yes    Intervention  Provide education on lifestyle modifcations including regular physical activity/exercise, weight management, moderate sodium restriction and increased consumption of fresh fruit, vegetables, and low fat dairy, alcohol moderation, and smoking cessation.;Monitor prescription use compliance.    Expected Outcomes  Short Term: Continued assessment and intervention until BP is < 140/976mHG in hypertensive participants. < 130/8084mG in hypertensive participants with diabetes, heart failure or chronic kidney disease.;Long Term: Maintenance of blood pressure at goal levels.    Lipids  Yes    Intervention  Provide education and support for participant on nutrition & aerobic/resistive exercise along with prescribed medications to achieve LDL <59m46mDL >40mg12m Expected Outcomes  Short Term: Participant states understanding of desired cholesterol values and is compliant with medications prescribed. Participant is following exercise prescription and nutrition guidelines.;Long Term: Cholesterol controlled with medications as prescribed, with individualized exercise RX and with personalized nutrition plan. Value goals: LDL < 59mg,51m > 40 mg.       Core Components/Risk Factors/Patient Goals Review:  Core Components/Risk Factors/Patient Goals at Discharge (Final Review):    ITP Comments: ITP Comments    Row Name 05/26/18 1251           ITP Comments   Med Review completed. Initial ITP created. Diagnosis can be found in [facility] 9/7          Comments: Initial ITP

## 2018-05-30 ENCOUNTER — Encounter: Payer: Medicare Other | Admitting: *Deleted

## 2018-05-30 DIAGNOSIS — Z955 Presence of coronary angioplasty implant and graft: Secondary | ICD-10-CM | POA: Diagnosis not present

## 2018-05-30 DIAGNOSIS — N400 Enlarged prostate without lower urinary tract symptoms: Secondary | ICD-10-CM | POA: Diagnosis not present

## 2018-05-30 DIAGNOSIS — Z7982 Long term (current) use of aspirin: Secondary | ICD-10-CM | POA: Diagnosis not present

## 2018-05-30 DIAGNOSIS — I213 ST elevation (STEMI) myocardial infarction of unspecified site: Secondary | ICD-10-CM

## 2018-05-30 DIAGNOSIS — Z79899 Other long term (current) drug therapy: Secondary | ICD-10-CM | POA: Diagnosis not present

## 2018-05-30 DIAGNOSIS — Z7902 Long term (current) use of antithrombotics/antiplatelets: Secondary | ICD-10-CM | POA: Diagnosis not present

## 2018-05-30 NOTE — Progress Notes (Signed)
Daily Session Note  Patient Details  Name: Justin [name] MRN: [id number] Date of Birth: 1940/12/01 Referring Provider:     Cardiac Rehab from 05/26/2018 in [facility] Cardiac and Pulmonary Rehab  Referring Provider  Paraschos      Encounter Date: 05/30/2018  Check In: Session Check In - 05/30/18 0803      Check-In   Supervising physician immediately available to respond to emergencies  See telemetry face sheet for immediately available ER MD    Location  [facility]-Cardiac & Pulmonary Rehab    Staff Present  Earlean Shawl, BS, ACSM CEP, Exercise Physiologist;Jessica Luan Pulling, MA, RCEP, CCRP, Exercise Physiologist;Susanne Bice, RN, BSN, CCRP    Medication changes reported      No    Fall or balance concerns reported     No    Tobacco Cessation  No Change    Warm-up and Cool-down  Performed on first and last piece of equipment    Resistance Training Performed  Yes    VAD Patient?  No    PAD/SET Patient?  No      Pain Assessment   Currently in Pain?  No/denies    Multiple Pain Sites  No          Social History   Tobacco Use  Smoking Status Former Smoker  . Packs/day: 2.00  . Types: Cigarettes  . Last attempt to quit: 01/17/1979  . Years since quitting: 39.3  Smokeless Tobacco Never Used    Goals Met:  Exercise tolerated well Personal goals reviewed No report of cardiac concerns or symptoms Strength training completed today  Goals Unmet:  Not Applicable  Comments: First full day of exercise!  Patient was oriented to gym and equipment including functions, settings, policies, and procedures.  Patient's individual exercise prescription and treatment plan were reviewed.  All starting workloads were established based on the results of the 6 minute walk test done at initial orientation visit.  The plan for exercise progression was also introduced and progression will be customized based on patient's performance and goals.    Dr. Emily Filbert is Medical Director for [place] and [facility].

## 2018-06-01 DIAGNOSIS — Z7902 Long term (current) use of antithrombotics/antiplatelets: Secondary | ICD-10-CM | POA: Diagnosis not present

## 2018-06-01 DIAGNOSIS — I213 ST elevation (STEMI) myocardial infarction of unspecified site: Secondary | ICD-10-CM

## 2018-06-01 DIAGNOSIS — Z79899 Other long term (current) drug therapy: Secondary | ICD-10-CM | POA: Diagnosis not present

## 2018-06-01 DIAGNOSIS — Z955 Presence of coronary angioplasty implant and graft: Secondary | ICD-10-CM | POA: Diagnosis not present

## 2018-06-01 DIAGNOSIS — N400 Enlarged prostate without lower urinary tract symptoms: Secondary | ICD-10-CM | POA: Diagnosis not present

## 2018-06-01 DIAGNOSIS — Z7982 Long term (current) use of aspirin: Secondary | ICD-10-CM | POA: Diagnosis not present

## 2018-06-01 NOTE — Progress Notes (Signed)
Daily Session Note  Patient Details  Name: Justin [name] MRN: [id number] Date of Birth: 07-18-1941 Referring Provider:     Cardiac Rehab from 05/26/2018 in [facility] Cardiac and Pulmonary Rehab  Referring Provider  Paraschos      Encounter Date: 06/01/2018  Check In: Session Check In - 06/01/18 4917      Check-In   Supervising physician immediately available to respond to emergencies  See telemetry face sheet for immediately available ER MD    Location  [facility]-Cardiac & Pulmonary Rehab    Staff Present  Alberteen Sam, MA, RCEP, CCRP, Exercise Physiologist;Refugio Mcconico [place];Heath Lark, RN, BSN, CCRP    Medication changes reported      No    Fall or balance concerns reported     No    Warm-up and Cool-down  Performed on first and last piece of equipment    Resistance Training Performed  Yes    VAD Patient?  No      Pain Assessment   Currently in Pain?  No/denies          Social History   Tobacco Use  Smoking Status Former Smoker  . Packs/day: 2.00  . Types: Cigarettes  . Last attempt to quit: 01/17/1979  . Years since quitting: 39.3  Smokeless Tobacco Never Used    Goals Met:  Independence with exercise equipment Exercise tolerated well No report of cardiac concerns or symptoms Strength training completed today  Goals Unmet:  Not Applicable  Comments: Pt able to follow exercise prescription today without complaint.  Will continue to monitor for progression.    Dr. Emily Filbert is Medical Director for [place] and [facility].

## 2018-06-03 DIAGNOSIS — I213 ST elevation (STEMI) myocardial infarction of unspecified site: Secondary | ICD-10-CM

## 2018-06-03 DIAGNOSIS — Z7982 Long term (current) use of aspirin: Secondary | ICD-10-CM | POA: Diagnosis not present

## 2018-06-03 DIAGNOSIS — Z955 Presence of coronary angioplasty implant and graft: Secondary | ICD-10-CM

## 2018-06-03 DIAGNOSIS — Z79899 Other long term (current) drug therapy: Secondary | ICD-10-CM | POA: Diagnosis not present

## 2018-06-03 DIAGNOSIS — N400 Enlarged prostate without lower urinary tract symptoms: Secondary | ICD-10-CM | POA: Diagnosis not present

## 2018-06-03 DIAGNOSIS — Z7902 Long term (current) use of antithrombotics/antiplatelets: Secondary | ICD-10-CM | POA: Diagnosis not present

## 2018-06-03 NOTE — Progress Notes (Signed)
Daily Session Note  Patient Details  Name: Justin [name] MRN: [id number] Date of Birth: [birth year] Referring Provider:     Cardiac Rehab from 05/26/2018 in [facility] Cardiac and Pulmonary Rehab  Referring Provider  Paraschos      Encounter Date: 06/03/2018  Check In:      Social History   Tobacco Use  Smoking Status Former Smoker  . Packs/day: 2.00  . Types: Cigarettes  . Last attempt to quit: 01/17/1979  . Years since quitting: 39.4  Smokeless Tobacco Never Used    Goals Met:  Independence with exercise equipment Exercise tolerated well No report of cardiac concerns or symptoms Strength training completed today  Goals Unmet:  Not Applicable  Comments: Pt able to follow exercise prescription today without complaint.  Will continue to monitor for progression.    Dr. Emily Filbert is Medical Director for [place] and [facility].

## 2018-06-07 ENCOUNTER — Encounter: Payer: Medicare Other | Attending: Cardiology

## 2018-06-07 DIAGNOSIS — I10 Essential (primary) hypertension: Secondary | ICD-10-CM | POA: Insufficient documentation

## 2018-06-07 DIAGNOSIS — M1990 Unspecified osteoarthritis, unspecified site: Secondary | ICD-10-CM | POA: Diagnosis not present

## 2018-06-07 DIAGNOSIS — N400 Enlarged prostate without lower urinary tract symptoms: Secondary | ICD-10-CM | POA: Diagnosis not present

## 2018-06-07 DIAGNOSIS — I213 ST elevation (STEMI) myocardial infarction of unspecified site: Secondary | ICD-10-CM | POA: Insufficient documentation

## 2018-06-07 DIAGNOSIS — Z87442 Personal history of urinary calculi: Secondary | ICD-10-CM | POA: Insufficient documentation

## 2018-06-07 DIAGNOSIS — Z8551 Personal history of malignant neoplasm of bladder: Secondary | ICD-10-CM | POA: Insufficient documentation

## 2018-06-07 DIAGNOSIS — Z79899 Other long term (current) drug therapy: Secondary | ICD-10-CM | POA: Insufficient documentation

## 2018-06-07 DIAGNOSIS — Z7902 Long term (current) use of antithrombotics/antiplatelets: Secondary | ICD-10-CM | POA: Diagnosis not present

## 2018-06-07 DIAGNOSIS — H9191 Unspecified hearing loss, right ear: Secondary | ICD-10-CM | POA: Insufficient documentation

## 2018-06-07 DIAGNOSIS — Z955 Presence of coronary angioplasty implant and graft: Secondary | ICD-10-CM | POA: Diagnosis not present

## 2018-06-07 DIAGNOSIS — Z7982 Long term (current) use of aspirin: Secondary | ICD-10-CM | POA: Diagnosis not present

## 2018-06-07 DIAGNOSIS — Z87891 Personal history of nicotine dependence: Secondary | ICD-10-CM | POA: Insufficient documentation

## 2018-06-07 NOTE — Progress Notes (Signed)
Daily Session Note  Patient Details  Name: MATTEW CHRISWELL MRN: [id number] Date of Birth: February 21, 1941 Referring Provider:     Cardiac Rehab from 05/26/2018 in [facility] Cardiac and Pulmonary Rehab  Referring Provider  Paraschos      Encounter Date: 06/07/2018  Check In: Session Check In - 06/07/18 0926      Check-In   Supervising physician immediately available to respond to emergencies  See telemetry face sheet for immediately available ER MD    Location  [facility]-Cardiac & Pulmonary Rehab    Staff Present  Nyoka Cowden, RN, BSN, [place], BS, RRT, Respiratory Lennie Hummer, MA, RCEP, CCRP, Exercise Physiologist;Amanda Oletta Darter, [place], ACSM CEP, Exercise Physiologist    Medication changes reported      No    Fall or balance concerns reported     No    Warm-up and Cool-down  Performed on first and last piece of equipment    Resistance Training Performed  Yes    VAD Patient?  No    PAD/SET Patient?  No      Pain Assessment   Currently in Pain?  No/denies    Multiple Pain Sites  No          Social History   Tobacco Use  Smoking Status Former Smoker  . Packs/day: 2.00  . Types: Cigarettes  . Last attempt to quit: 01/17/1979  . Years since quitting: 39.4  Smokeless Tobacco Never Used    Goals Met:  Independence with exercise equipment Exercise tolerated well No report of cardiac concerns or symptoms Strength training completed today  Goals Unmet:  Not Applicable  Comments: Pt able to follow exercise prescription today without complaint.  Will continue to monitor for progression.    Dr. Emily Filbert is Medical Director for [place] and [facility].

## 2018-06-08 ENCOUNTER — Encounter: Payer: Self-pay | Admitting: Cardiology

## 2018-06-09 DIAGNOSIS — I213 ST elevation (STEMI) myocardial infarction of unspecified site: Secondary | ICD-10-CM | POA: Diagnosis not present

## 2018-06-09 DIAGNOSIS — Z7902 Long term (current) use of antithrombotics/antiplatelets: Secondary | ICD-10-CM | POA: Diagnosis not present

## 2018-06-09 DIAGNOSIS — N400 Enlarged prostate without lower urinary tract symptoms: Secondary | ICD-10-CM | POA: Diagnosis not present

## 2018-06-09 DIAGNOSIS — Z955 Presence of coronary angioplasty implant and graft: Secondary | ICD-10-CM | POA: Diagnosis not present

## 2018-06-09 DIAGNOSIS — Z7982 Long term (current) use of aspirin: Secondary | ICD-10-CM | POA: Diagnosis not present

## 2018-06-09 DIAGNOSIS — Z79899 Other long term (current) drug therapy: Secondary | ICD-10-CM | POA: Diagnosis not present

## 2018-06-09 NOTE — Progress Notes (Signed)
Daily Session Note  Patient Details  Name: TYRIQ MORAGNE MRN: [id number] Date of Birth: 1941/04/13 Referring Provider:     Cardiac Rehab from 05/26/2018 in [facility] Cardiac and Pulmonary Rehab  Referring Provider  Paraschos      Encounter Date: 06/09/2018  Check In: Session Check In - 06/09/18 5830      Check-In   Supervising physician immediately available to respond to emergencies  See telemetry face sheet for immediately available ER MD    Location  [facility]-Cardiac & Pulmonary Rehab    Staff Present  Justin Mend Lorre Nick, MA, RCEP, CCRP, Exercise Physiologist;Carroll Enterkin, RN, BSN    Medication changes reported      No    Fall or balance concerns reported     No    Warm-up and Cool-down  Performed on first and last piece of equipment    Resistance Training Performed  Yes    VAD Patient?  No    PAD/SET Patient?  No      Pain Assessment   Currently in Pain?  No/denies          Social History   Tobacco Use  Smoking Status Former Smoker  . Packs/day: 2.00  . Types: Cigarettes  . Last attempt to quit: 01/17/1979  . Years since quitting: 39.4  Smokeless Tobacco Never Used    Goals Met:  Independence with exercise equipment Exercise tolerated well No report of cardiac concerns or symptoms Strength training completed today  Goals Unmet:  Not Applicable  Comments: Pt able to follow exercise prescription today without complaint.  Will continue to monitor for progression.    Dr. Emily Filbert is Medical Director for [place] and [facility].

## 2018-06-14 ENCOUNTER — Encounter: Payer: Medicare Other | Admitting: *Deleted

## 2018-06-14 DIAGNOSIS — Z79899 Other long term (current) drug therapy: Secondary | ICD-10-CM | POA: Diagnosis not present

## 2018-06-14 DIAGNOSIS — Z955 Presence of coronary angioplasty implant and graft: Secondary | ICD-10-CM | POA: Diagnosis not present

## 2018-06-14 DIAGNOSIS — N400 Enlarged prostate without lower urinary tract symptoms: Secondary | ICD-10-CM | POA: Diagnosis not present

## 2018-06-14 DIAGNOSIS — I213 ST elevation (STEMI) myocardial infarction of unspecified site: Secondary | ICD-10-CM

## 2018-06-14 DIAGNOSIS — Z7902 Long term (current) use of antithrombotics/antiplatelets: Secondary | ICD-10-CM | POA: Diagnosis not present

## 2018-06-14 DIAGNOSIS — Z7982 Long term (current) use of aspirin: Secondary | ICD-10-CM | POA: Diagnosis not present

## 2018-06-14 NOTE — Progress Notes (Signed)
Daily Session Note  Patient Details  Name: MYER BOHLMAN MRN: [id number] Date of Birth: [birth year] Referring Provider:     Cardiac Rehab from 05/26/2018 in [facility] Cardiac and Pulmonary Rehab  Referring Provider  Paraschos      Encounter Date: 06/14/2018  Check In: Session Check In - 06/14/18 0926      Check-In   Supervising physician immediately available to respond to emergencies  See telemetry face sheet for immediately available ER MD    Location  [facility]-Cardiac & Pulmonary Rehab    Staff Present  Alberteen Sam, MA, RCEP, CCRP, Exercise Physiologist;Amanda Oletta Darter, BA, ACSM CEP, Exercise Physiologist;Susanne Bice, RN, BSN, CCRP    Medication changes reported      No    Fall or balance concerns reported     No    Warm-up and Cool-down  Performed on first and last piece of equipment    Resistance Training Performed  Yes    VAD Patient?  No    PAD/SET Patient?  No      Pain Assessment   Currently in Pain?  No/denies          Social History   Tobacco Use  Smoking Status Former Smoker  . Packs/day: 2.00  . Types: Cigarettes  . Last attempt to quit: 01/17/1979  . Years since quitting: 39.4  Smokeless Tobacco Never Used    Goals Met:  Independence with exercise equipment Exercise tolerated well No report of cardiac concerns or symptoms Strength training completed today  Goals Unmet:  Not Applicable  Comments: Reviewed home exercise with pt today.  Pt plans to walk for exercise.  Reviewed THR, pulse, RPE, sign and symptoms, NTG use, and when to call 911 or MD.  Also discussed weather considerations and indoor options.  Pt voiced understanding.   Dr. Emily Filbert is Medical Director for [place] and [facility].

## 2018-06-15 ENCOUNTER — Encounter: Payer: Self-pay | Admitting: *Deleted

## 2018-06-15 DIAGNOSIS — Z955 Presence of coronary angioplasty implant and graft: Secondary | ICD-10-CM

## 2018-06-15 DIAGNOSIS — I213 ST elevation (STEMI) myocardial infarction of unspecified site: Secondary | ICD-10-CM

## 2018-06-15 NOTE — Progress Notes (Signed)
Cardiac Individual Treatment Plan  Patient Details  Name: Justin [name] MRN: [id number] Date of Birth: Nov 11, 1940 Referring Provider:     Cardiac Rehab from 05/26/2018 in [facility] Cardiac and Pulmonary Rehab  Referring Provider  Paraschos      Initial Encounter Date:    Cardiac Rehab from 05/26/2018 in [facility] Cardiac and Pulmonary Rehab  Date  05/26/18      Visit Diagnosis: S/P coronary artery stent placement  ST elevation myocardial infarction (STEMI), unspecified artery ([place])  Patient's Home Medications on Admission:  Current Outpatient Medications:  .  aspirin 81 MG chewable tablet, Chew 1 tablet (81 mg total) by mouth daily., Disp: , Rfl:  .  atorvastatin (LIPITOR) 80 MG tablet, Take 1 tablet (80 mg total) by mouth daily at 6 PM., Disp: 30 tablet, Rfl: 0 .  Cranberry-Vitamin C-Vitamin E (TH CRANBERRY CONCENTRATE PO), Take 2 tablets by mouth daily. , Disp: , Rfl:  .  finasteride (PROSCAR) 5 MG tablet, Take 1 tablet (5 mg total) by mouth daily. pm (Patient not taking: Reported on 05/26/2018), Disp: 30 tablet, Rfl: 3 .  lisinopril (PRINIVIL,ZESTRIL) 20 MG tablet, Take 20 mg by mouth daily. , Disp: , Rfl:  .  metoprolol tartrate (LOPRESSOR) 25 MG tablet, Take 1 tablet (25 mg total) by mouth 2 (two) times daily., Disp: 60 tablet, Rfl: 0 .  OVER THE COUNTER MEDICATION, Take 1 tablet by mouth 2 (two) times daily. Prosta-Rye Plus, Disp: , Rfl:  .  tamsulosin (FLOMAX) 0.4 MG CAPS capsule, Take 1 capsule (0.4 mg total) by mouth daily., Disp: 30 capsule, Rfl: 3 .  ticagrelor (BRILINTA) 90 MG TABS tablet, Take 1 tablet (90 mg total) by mouth 2 (two) times daily., Disp: 60 tablet, Rfl: 0  Past Medical History: Past Medical History:  Diagnosis Date  . Arthritis   . Bell's palsy   . Bell's palsy   . BPH (benign prostatic hyperplasia)   . Cancer ([place])    bladder  . Deaf    right ear  . Dizziness   . History of urinary urgency   . Hydronephrosis, bilateral   . Hypertension   . Kidney stone    . Neuroma    acoustic  . 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 Urinary frequency   . Urinary tract infection     Tobacco Use: Social History   Tobacco Use  Smoking Status Former Smoker  . Packs/day: 2.00  . Types: Cigarettes  . Last attempt to quit: 01/17/1979  . Years since quitting: 39.4  Smokeless Tobacco Never Used    Labs: Recent Chemical engineer    Labs for ITP Cardiac and Pulmonary Rehab Latest Ref Rng & Units 05/15/2018   Hemoglobin A1c 4.8 - 5.6 % 6.2(H)       Exercise Target Goals: Exercise Program Goal: Individual exercise prescription set using results from initial 6 min walk test and THRR while considering  patient's activity barriers and safety.   Exercise Prescription Goal: Initial exercise prescription builds to 30-45 minutes a day of aerobic activity, 2-3 days per week.  Home exercise guidelines will be given to patient during program as part of exercise prescription that the participant will acknowledge.  Activity Barriers & Risk Stratification: Activity Barriers & Cardiac Risk Stratification - 05/26/18 1324      Activity Barriers & Cardiac Risk Stratification   Activity Barriers  Balance Concerns    Cardiac Risk Stratification  Moderate       6 Minute  Walk: 6 Minute Walk    Row Name 05/26/18 1243         6 Minute Walk   Phase  Initial     Distance  1045 feet     Walk Time  6 minutes     # of Rest Breaks  0     MPH  1.98     METS  1.94     RPE  11     Perceived Dyspnea   0     VO2 Peak  6.78     Symptoms  No     Resting HR  61 bpm     Resting BP  104/56     Resting Oxygen Saturation   96 %     Exercise Oxygen Saturation  during 6 min walk  97 %     Max Ex. HR  103 bpm     Max Ex. BP  134/60     2 Minute Post BP  100/58        Oxygen Initial Assessment:   Oxygen Re-Evaluation:   Oxygen Discharge (Final Oxygen Re-Evaluation):   Initial Exercise Prescription: Initial Exercise Prescription - 05/26/18 1200      Date  of Initial Exercise RX and Referring Provider   Date  05/26/18    Referring Provider  Paraschos      Treadmill   MPH  1.3    Grade  0    Minutes  15    METs  2      NuStep   Level  2    SPM  80    Minutes  15    METs  2      REL-XR   Level  2    Speed  50    Minutes  15    METs  2      Prescription Details   Frequency (times per week)  3    Duration  Progress to 45 minutes of aerobic exercise without signs/symptoms of physical distress      Intensity   THRR 40-80% of Max Heartrate  94-126    Ratings of Perceived Exertion  11-13    Perceived Dyspnea  0-4      Resistance Training   Training Prescription  Yes    Weight  3 lb    Reps  10-15       Perform Capillary Blood Glucose checks as needed.  Exercise Prescription Changes: Exercise Prescription Changes    Row Name 05/26/18 1200 06/06/18 1500 06/14/18 1000         Response to Exercise   Blood Pressure (Admit)  104/56  112/66  -     Blood Pressure (Exercise)  134/60  134/62  -     Blood Pressure (Exit)  100/58  104/56  -     Heart Rate (Admit)  67 bpm  80 bpm  -     Heart Rate (Exercise)  103 bpm  100 bpm  -     Heart Rate (Exit)  59 bpm  65 bpm  -     Oxygen Saturation (Admit)  96 %  -  -     Oxygen Saturation (Exercise)  97 %  -  -     Oxygen Saturation (Exit)  98 %  -  -     Rating of Perceived Exertion (Exercise)  11  17  -     Symptoms  -  fatigue  -  Duration  -  Continue with 30 min of aerobic exercise without signs/symptoms of physical distress.  -     Intensity  -  THRR unchanged  -       Progression   Progression  -  Continue to progress workloads to maintain intensity without signs/symptoms of physical distress.  -     Average METs  -  2.4  -       Resistance Training   Training Prescription  -  Yes  -     Weight  -  3 lb  -     Reps  -  10-15  -       Interval Training   Interval Training  -  No  -       Treadmill   MPH  -  1.3  -     Grade  -  0  -     Minutes  -  15  -      METs  -  1.99  -       NuStep   Level  -  2  -     Minutes  -  15  -     METs  -  2.2  -       REL-XR   Level  -  2  -     Minutes  -  15  -     METs  -  3  -       Home Exercise Plan   Plans to continue exercise at  -  -  Home (comment) [place] track [place] home     Frequency  -  -  Add 2 additional days to program exercise sessions.     Initial Home Exercises Provided  -  -  06/14/18        Exercise Comments: Exercise Comments    Row Name 05/30/18 0804           Exercise Comments  First full day of exercise!  Patient was oriented to gym and equipment including functions, settings, policies, and procedures.  Patient's individual exercise prescription and treatment plan were reviewed.  All starting workloads were established based on the results of the 6 minute walk test done at initial orientation visit.  The plan for exercise progression was also introduced and progression will be customized based on patient's performance and goals.          Exercise Goals and Review: Exercise Goals    Row Name 05/26/18 1242             Exercise Goals   Increase Physical Activity  Yes       Intervention  Provide advice, education, support and counseling about physical activity/exercise needs.;Develop an individualized exercise prescription for aerobic and resistive training based on initial evaluation findings, risk stratification, comorbidities and participant's personal goals.       Expected Outcomes  Short Term: Attend rehab on a regular basis to increase amount of physical activity.;Long Term: Add in home exercise to make exercise part of routine and to increase amount of physical activity.;Long Term: Exercising regularly at least 3-5 days a week.       Increase Strength and Stamina  Yes       Intervention  Provide advice, education, support and counseling about physical activity/exercise needs.;Develop an individualized exercise prescription for aerobic and resistive training based on  initial evaluation findings, risk stratification, comorbidities and participant's personal goals.  Expected Outcomes  Short Term: Increase workloads from initial exercise prescription for resistance, speed, and METs.;Short Term: Perform resistance training exercises routinely during rehab and add in resistance training at home;Long Term: Improve cardiorespiratory fitness, muscular endurance and strength as measured by increased METs and functional capacity (6MWT)       Able to understand and use rate of perceived exertion (RPE) scale  Yes       Intervention  Provide education and explanation on how to use RPE scale       Expected Outcomes  Short Term: Able to use RPE daily in rehab to express subjective intensity level;Long Term:  Able to use RPE to guide intensity level when exercising independently       Able to understand and use Dyspnea scale  Yes       Intervention  Provide education and explanation on how to use Dyspnea scale       Expected Outcomes  Short Term: Able to use Dyspnea scale daily in rehab to express subjective sense of shortness of breath during exertion;Long Term: Able to use Dyspnea scale to guide intensity level when exercising independently       Knowledge and understanding of Target Heart Rate Range (THRR)  Yes       Intervention  Provide education and explanation of THRR including how the numbers were predicted and where they are located for reference       Expected Outcomes  Short Term: Able to state/look up THRR;Short Term: Able to use daily as guideline for intensity in rehab;Long Term: Able to use THRR to govern intensity when exercising independently       Able to check pulse independently  Yes       Intervention  Provide education and demonstration on how to check pulse in carotid and radial arteries.;Review the importance of being able to check your own pulse for safety during independent exercise       Expected Outcomes  Short Term: Able to explain why pulse  checking is important during independent exercise;Long Term: Able to check pulse independently and accurately       Understanding of Exercise Prescription  Yes       Intervention  Provide education, explanation, and written materials on patient's individual exercise prescription       Expected Outcomes  Short Term: Able to explain program exercise prescription;Long Term: Able to explain home exercise prescription to exercise independently          Exercise Goals Re-Evaluation : Exercise Goals Re-Evaluation    Row Name 05/30/18 0804 06/06/18 1450 06/14/18 1043         Exercise Goal Re-Evaluation   Exercise Goals Review  Increase Physical Activity;Increase Strength and Stamina;Able to understand and use rate of perceived exertion (RPE) scale;Knowledge and understanding of Target Heart Rate Range (THRR);Understanding of Exercise Prescription  Increase Physical Activity;Increase Strength and Stamina;Understanding of Exercise Prescription  Increase Physical Activity;Able to understand and use rate of perceived exertion (RPE) scale;Knowledge and understanding of Target Heart Rate Range (THRR);Increase Strength and Stamina     Comments  Reviewed RPE scale, THR and program prescription with pt today.  Pt voiced understanding and was given a copy of goals to take home.   Derren is off to a good start in rehab.  He is switching to the 9am class as he is struggling to get up early enough.  We will continue to monitor his progress.   Reviewed home exercise with pt today.  Pt plans  to walk for exercise.  Reviewed THR, pulse, RPE, sign and symptoms, NTG use, and when to call 911 or MD.  Also discussed weather considerations and indoor options.  Pt voiced understanding.     Expected Outcomes  Short: Use RPE daily to regulate intensity. Long: Follow program prescription in THR.  Short: Continue to attend new class regularly.  Long: Continue to increase strength and stamina.   Short - walk at least twice per week  outside program sessions Long - maintain exercise on his own        Discharge Exercise Prescription (Final Exercise Prescription Changes): Exercise Prescription Changes - 06/14/18 1000      Home Exercise Plan   Plans to continue exercise at  Home (comment)   [place] near home   Frequency  Add 2 additional days to program exercise sessions.    Initial Home Exercises Provided  06/14/18       Nutrition:  Target Goals: Understanding of nutrition guidelines, daily intake of sodium <1565m, cholesterol <2040m calories 30% from fat and 7% or less from saturated fats, daily to have 5 or more servings of fruits and vegetables.  Biometrics: Pre Biometrics - 05/26/18 1241      Pre Biometrics   Height  5' 7"  (1.702 m)    Weight  195 lb 12.8 oz (88.8 kg)    Waist Circumference  40.5 inches    Hip Circumference  43 inches    Waist to Hip Ratio  0.94 %    BMI (Calculated)  30.66        Nutrition Therapy Plan and Nutrition Goals: Nutrition Therapy & Goals - 06/01/18 1449      Nutrition Therapy   Diet  TLC    Drug/Food Interactions  Statins/Certain Fruits    Protein (specify units)  12oz    Fiber  30 grams    Whole Grain Foods  3 servings   eats whole grains regularly   Saturated Fats  16 max. grams    Fruits and Vegetables  6 servings/day   8 ideal; eats multiple servings of fruits and vegetables daily   Sodium  1500 grams      Personal Nutrition Goals   Nutrition Goal  Continue to reduce intake of processed snack foods and added sugars, and to limit sodium intake, great job    Personal Goal #2  Work with your housemates to [employer] wider variety of vegetables each week    Comments  He and his wife live with another couple who are not as health conscious, and the families share cooking responsibilites, so eating a diet they prefer is sometimes challenging. They choose whole grain products, eat a variety of fruits/ vegetables, and choose lower sugar dessert items. They recently  purchased a dehydrator and are in the process of making their own dried fruit and trail mix without added sugar. They choose SF beverages and eat at home 99% of the time. They choose low sodium options when they are able      Intervention Plan   Intervention  Prescribe, educate and counsel regarding individualized specific dietary modifications aiming towards targeted core components such as weight, hypertension, lipid management, diabetes, heart failure and other comorbidities.;Nutrition handout(s) given to patient.   no-salt seasoning blends handout; low sodium nutrition therapy handout   Expected Outcomes  Short Term Goal: Understand basic principles of dietary content, such as calories, fat, sodium, cholesterol and nutrients.;Short Term Goal: A plan has been developed with personal nutrition goals  set during dietitian appointment.;Long Term Goal: Adherence to prescribed nutrition plan.       Nutrition Assessments: Nutrition Assessments - 05/26/18 1308      MEDFICTS Scores   Pre Score  56       Nutrition Goals Re-Evaluation: Nutrition Goals Re-Evaluation    [place] Name 06/01/18 1457             Goals   Nutrition Goal  Continue to reduce intake of processed snack foods and added sugars, and to limit sodium intake, great job       Comment  He and his wife have made significant changes to their diet in recent years and try to eat minimal processed foods other than the occasional snack food.       Expected Outcome  He will continue current diet practices and make the best decisions for his health food-wise, even if others in the household are not making choices that are as nutrient-dense         Personal Goal #2 Re-Evaluation   Personal Goal #2  Work with your housemates to cook a wider variety of vegetables each week          Nutrition Goals Discharge (Final Nutrition Goals Re-Evaluation): Nutrition Goals Re-Evaluation - 06/01/18 1457      Goals   Nutrition Goal  Continue to  reduce intake of processed snack foods and added sugars, and to limit sodium intake, great job    Comment  He and his wife have made significant changes to their diet in recent years and try to eat minimal processed foods other than the occasional snack food.    Expected Outcome  He will continue current diet practices and make the best decisions for his health food-wise, even if others in the household are not making choices that are as nutrient-dense      Personal Goal #2 Re-Evaluation   Personal Goal #2  Work with your housemates to cook a wider variety of vegetables each week       Psychosocial: Target Goals: Acknowledge presence or absence of significant depression and/or stress, maximize coping skills, provide positive support system. Participant is able to verbalize types and ability to use techniques and skills needed for reducing stress and depression.   Initial Review & Psychosocial Screening: Initial Psych Review & Screening - 05/26/18 1305      Initial Review   Current issues with  Current Sleep Concerns;Current Stress Concerns    Source of Stress Concerns  Unable to perform yard/household activities    Comments  Printice has had different surgeries, Bells Palsy (right side), and now a MI. He says he handles it well and doesn't feel too stressed out. He feels lucky to be here and wants to make sure this MI doesn't happen again. His wife and he are very supportive of each other and make a "great team"      [place]?  Yes   wife     Barriers   Psychosocial barriers to participate in program  There are no identifiable barriers or psychosocial needs.;The patient should benefit from training in stress management and relaxation.      Screening Interventions   Interventions  Encouraged to exercise;Program counselor consult;To provide support and resources with identified psychosocial needs;Provide feedback about the scores to participant    Expected  Outcomes  Short Term goal: Utilizing psychosocial counselor, staff and physician to assist with identification of specific Stressors or current issues interfering  with healing process. Setting desired goal for each stressor or current issue identified.;Long Term Goal: Stressors or current issues are controlled or eliminated.;Short Term goal: Identification and review with participant of any Quality of Life or Depression concerns found by scoring the questionnaire.;Long Term goal: The participant improves quality of Life and PHQ9 Scores as seen by post scores and/or verbalization of changes       Quality of Life Scores:  Quality of Life - 05/26/18 1307      Quality of Life   Select  Quality of Life      Quality of Life Scores   Health/Function Pre  14.96 %    Socioeconomic Pre  12.6 %    Psych/Spiritual Pre  11 %    Family Pre  14.6 %    GLOBAL Pre  14.13 %      Scores of 19 and below usually indicate a poorer quality of life in these areas.  A difference of  2-3 points is a clinically meaningful difference.  A difference of 2-3 points in the total score of the Quality of Life Index has been associated with significant improvement in overall quality of life, self-image, physical symptoms, and general health in studies assessing change in quality of life.  PHQ-9: Recent Review Flowsheet Data    Depression screen [facility] 2/9 05/26/2018   Decreased Interest 2   Down, Depressed, Hopeless 0   PHQ - 2 Score 2   Altered sleeping 0   Tired, decreased energy 1   Change in appetite 1   Feeling bad or failure about yourself  1   Trouble concentrating 2   Moving slowly or fidgety/restless 0   Suicidal thoughts 0   PHQ-9 Score 7   Difficult doing work/chores Not difficult at all     Interpretation of Total Score  Total Score Depression Severity:  1-4 = Minimal depression, 5-9 = Mild depression, 10-14 = Moderate depression, 15-19 = Moderately severe depression, 20-27 = Severe depression    Psychosocial Evaluation and Intervention: Psychosocial Evaluation - 06/07/18 1049      Psychosocial Evaluation & Interventions   Interventions  Encouraged to exercise with the program and follow exercise prescription;Stress management education    Comments  Counselor met with Mr. Dygert Kahre) today for initial psychosocial evaluation.  He is a 77 year old who had a heart attack and stent inserted on 9/7.  He has some kidney problems and a history of a brain tumor and hearing/sight problems in addition to his cardiac condition.  Zaidyn has a spouse of 59 years and some friends that he lives with for his support system.  He has (2) children who live out of state.  Wyeth reports sleeping "lousy" with maybe 3-4 hours per night.  He has a good appetite. Tylor denies a history of depression or anxiety or any current symptoms and states he is generally in a good mood.  However, Emmit was very tearful in speaking with this counselor and has had some significant losses with the death of all of his immediate family members and a best friend who committed suicide years ago - but still impactful.  His primary stressors are his health and finances.  Christobal's goals are to "survive" this class and be healthier in general.  He will be followed by staff    Expected Outcomes  Short:  Arlon will exercise for his health and mental health and hopefully improve his sleep as well.  Long:  Caleb will develop healthier  lifestyle routines for his health and mental health.    Continue Psychosocial Services   Follow up required by staff       Psychosocial Re-Evaluation:   Psychosocial Discharge (Final Psychosocial Re-Evaluation):   Vocational Rehabilitation: Provide vocational rehab assistance to qualifying candidates.   Vocational Rehab Evaluation & Intervention: Vocational Rehab - 05/26/18 1305      Initial Vocational Rehab Evaluation & Intervention   Assessment shows need for Vocational Rehabilitation  No        Education: Education Goals: Education classes will be provided on a variety of topics geared toward better understanding of heart health and risk factor modification. Participant will state understanding/return demonstration of topics presented as noted by education test scores.  Learning Barriers/Preferences: Learning Barriers/Preferences - 05/26/18 1302      Learning Barriers/Preferences   Learning Barriers  Hearing    Learning Preferences  Verbal Instruction       Education Topics:  AED/CPR: - Group verbal and written instruction with the use of models to demonstrate the basic use of the AED with the basic ABC's of resuscitation.   General Nutrition Guidelines/Fats and Fiber: -Group instruction provided by verbal, written material, models and posters to present the general guidelines for heart healthy nutrition. Gives an explanation and review of dietary fats and fiber.   Controlling Sodium/Reading Food Labels: -Group verbal and written material supporting the discussion of sodium use in heart healthy nutrition. Review and explanation with models, verbal and written materials for utilization of the food label.   Exercise Physiology & General Exercise Guidelines: - Group verbal and written instruction with models to review the exercise physiology of the cardiovascular system and associated critical values. Provides general exercise guidelines with specific guidelines to those with heart or lung disease.    Cardiac Rehab from 06/14/2018 in [facility] Cardiac and Pulmonary Rehab  Date  06/01/18  Educator  [facility]  Instruction Review Code  1- Verbalizes Understanding      Aerobic Exercise & Resistance Training: - Gives group verbal and written instruction on the various components of exercise. Focuses on aerobic and resistive training programs and the benefits of this training and how to safely progress through these programs..   Cardiac Rehab from 06/14/2018 in [facility] Cardiac and  Pulmonary Rehab  Date  06/09/18  Educator  [facility]  Instruction Review Code  1- Verbalizes Understanding      Flexibility, Balance, Mind/Body Relaxation: Provides group verbal/written instruction on the benefits of flexibility and balance training, including mind/body exercise modes such as yoga, pilates and tai chi.  Demonstration and skill practice provided.   Cardiac Rehab from 06/14/2018 in [facility] Cardiac and Pulmonary Rehab  Date  06/14/18  Educator  [facility]  Instruction Review Code  1- Verbalizes Understanding      Stress and Anxiety: - Provides group verbal and written instruction about the health risks of elevated stress and causes of high stress.  Discuss the correlation between heart/lung disease and anxiety and treatment options. Review healthy ways to manage with stress and anxiety.   Depression: - Provides group verbal and written instruction on the correlation between heart/lung disease and depressed mood, treatment options, and the stigmas associated with seeking treatment.   Cardiac Rehab from 06/14/2018 in [facility] Cardiac and Pulmonary Rehab  Date  06/07/18  Educator  [facility]  Instruction Review Code  1- Verbalizes Understanding      Anatomy & Physiology of the Heart: - Group verbal and written instruction and models provide basic cardiac anatomy and  physiology, with the coronary electrical and arterial systems. Review of Valvular disease and Heart Failure   Cardiac Procedures: - Group verbal and written instruction to review commonly prescribed medications for heart disease. Reviews the medication, class of the drug, and side effects. Includes the steps to properly store meds and maintain the prescription regimen. (beta blockers and nitrates)   Cardiac Medications I: - Group verbal and written instruction to review commonly prescribed medications for heart disease. Reviews the medication, class of the drug, and side effects. Includes the steps to properly store meds and maintain the  prescription regimen.   Cardiac Medications II: -Group verbal and written instruction to review commonly prescribed medications for heart disease. Reviews the medication, class of the drug, and side effects. (all other drug classes)    Go Sex-Intimacy & Heart Disease, Get SMART - Goal Setting: - Group verbal and written instruction through game format to discuss heart disease and the return to sexual intimacy. Provides group verbal and written material to discuss and apply goal setting through the application of the S.M.A.R.T. Method.   Other Matters of the Heart: - Provides group verbal, written materials and models to describe Stable Angina and Peripheral Artery. Includes description of the disease process and treatment options available to the cardiac patient.   Exercise & Equipment Safety: - Individual verbal instruction and demonstration of equipment use and safety with use of the equipment.   Cardiac Rehab from 06/14/2018 in [facility] Cardiac and Pulmonary Rehab  Date  05/26/18  Educator  [facility]  Instruction Review Code  1- Verbalizes Understanding      Infection Prevention: - Provides verbal and written material to individual with discussion of infection control including proper hand washing and proper equipment cleaning during exercise session.   Cardiac Rehab from 06/14/2018 in [facility] Cardiac and Pulmonary Rehab  Date  05/26/18  Educator  [facility]  Instruction Review Code  1- Verbalizes Understanding      Falls Prevention: - Provides verbal and written material to individual with discussion of falls prevention and safety.   Cardiac Rehab from 06/14/2018 in [facility] Cardiac and Pulmonary Rehab  Date  05/26/18  Educator  [facility]  Instruction Review Code  1- Verbalizes Understanding      Diabetes: - Individual verbal and written instruction to review signs/symptoms of diabetes, desired ranges of glucose level fasting, after meals and with exercise. Acknowledge that pre and post exercise glucose  checks will be done for 3 sessions at entry of program.   Know Your Numbers and Risk Factors: -Group verbal and written instruction about important numbers in your health.  Discussion of what are risk factors and how they play a role in the disease process.  Review of Cholesterol, Blood Pressure, Diabetes, and BMI and the role they play in your overall health.   Sleep Hygiene: -Provides group verbal and written instruction about how sleep can affect your health.  Define sleep hygiene, discuss sleep cycles and impact of sleep habits. Review good sleep hygiene tips.    Other: -Provides group and verbal instruction on various topics (see comments)   Knowledge Questionnaire Score: Knowledge Questionnaire Score - 05/26/18 1303      Knowledge Questionnaire Score   Pre Score  17/26   correct answers reviewed with [place], focus on nutrition, exercise, a&p      Core Components/Risk Factors/Patient Goals at Admission: Personal Goals and Risk Factors at Admission - 05/26/18 1300      Core Components/Risk Factors/Patient Goals on Admission  Weight Management  Yes;Weight Loss    Intervention  Weight Management: Develop a combined nutrition and exercise program designed to reach desired caloric intake, while maintaining appropriate intake of nutrient and fiber, sodium and fats, and appropriate energy expenditure required for the weight goal.;Weight Management: Provide education and appropriate resources to help participant work on and attain dietary goals.;Weight Management/Obesity: Establish reasonable short term and long term weight goals.    Admit Weight  195 lb 12.8 oz (88.8 kg)    Goal Weight: Short Term  190 lb (86.2 kg)    Goal Weight: Long Term  190 lb (86.2 kg)    Expected Outcomes  Short Term: Continue to assess and modify interventions until short term weight is achieved;Long Term: Adherence to nutrition and physical activity/exercise program aimed toward attainment of established  weight goal;Weight Loss: Understanding of general recommendations for a balanced deficit meal plan, which promotes 1-2 lb weight loss per week and includes a negative energy balance of [phone] kcal/d;Understanding recommendations for meals to include 15-35% energy as protein, 25-35% energy from fat, 35-60% energy from carbohydrates, less than 247m of dietary cholesterol, 20-35 gm of total fiber daily;Understanding of distribution of calorie intake throughout the day with the consumption of 4-5 meals/snacks    Hypertension  Yes    Intervention  Provide education on lifestyle modifcations including regular physical activity/exercise, weight management, moderate sodium restriction and increased consumption of fresh fruit, vegetables, and low fat dairy, alcohol moderation, and smoking cessation.;Monitor prescription use compliance.    Expected Outcomes  Short Term: Continued assessment and intervention until BP is < 140/951mHG in hypertensive participants. < 130/8032mG in hypertensive participants with diabetes, heart failure or chronic kidney disease.;Long Term: Maintenance of blood pressure at goal levels.    Lipids  Yes    Intervention  Provide education and support for participant on nutrition & aerobic/resistive exercise along with prescribed medications to achieve LDL <60m66mDL >40mg42m Expected Outcomes  Short Term: Participant states understanding of desired cholesterol values and is compliant with medications prescribed. Participant is following exercise prescription and nutrition guidelines.;Long Term: Cholesterol controlled with medications as prescribed, with individualized exercise RX and with personalized nutrition plan. Value goals: LDL < 60mg,75m > 40 mg.       Core Components/Risk Factors/Patient Goals Review:    Core Components/Risk Factors/Patient Goals at Discharge (Final Review):    ITP Comments: ITP Comments    Row Name 05/26/18 1251 06/15/18 0643         ITP Comments   Med Review completed. Initial ITP created. Diagnosis can be found in [facility] day review.  Continue with ITP unless directed changes per Medical Director review.         Comments:

## 2018-06-16 ENCOUNTER — Encounter: Payer: Medicare Other | Admitting: *Deleted

## 2018-06-16 DIAGNOSIS — Z955 Presence of coronary angioplasty implant and graft: Secondary | ICD-10-CM | POA: Diagnosis not present

## 2018-06-16 DIAGNOSIS — I213 ST elevation (STEMI) myocardial infarction of unspecified site: Secondary | ICD-10-CM | POA: Diagnosis not present

## 2018-06-16 DIAGNOSIS — Z7982 Long term (current) use of aspirin: Secondary | ICD-10-CM | POA: Diagnosis not present

## 2018-06-16 DIAGNOSIS — Z7902 Long term (current) use of antithrombotics/antiplatelets: Secondary | ICD-10-CM | POA: Diagnosis not present

## 2018-06-16 DIAGNOSIS — N400 Enlarged prostate without lower urinary tract symptoms: Secondary | ICD-10-CM | POA: Diagnosis not present

## 2018-06-16 DIAGNOSIS — Z79899 Other long term (current) drug therapy: Secondary | ICD-10-CM | POA: Diagnosis not present

## 2018-06-16 NOTE — Progress Notes (Signed)
Daily Session Note  Patient Details  Name: Justin [name] MRN: [id number] Date of Birth: 08/12/1941 Referring Provider:     Cardiac Rehab from 05/26/2018 in [facility] Cardiac and Pulmonary Rehab  Referring Provider  Paraschos      Encounter Date: 06/16/2018  Check In: Session Check In - 06/16/18 0919      Check-In   Supervising physician immediately available to respond to emergencies  See telemetry face sheet for immediately available ER MD    Location  [facility]-Cardiac & Pulmonary Rehab    Staff Present  Alberteen Sam, MA, RCEP, CCRP, Exercise Physiologist;Joseph [place];Vida Rigger RN, BSN;Carroll Enterkin, RN, BSN    Medication changes reported      No    Fall or balance concerns reported     No    Warm-up and Cool-down  Performed on first and last piece of Teacher, music Performed  Yes    VAD Patient?  No    PAD/SET Patient?  No      Pain Assessment   Currently in Pain?  No/denies          Social History   Tobacco Use  Smoking Status Former Smoker  . Packs/day: 2.00  . Types: Cigarettes  . Last attempt to quit: 01/17/1979  . Years since quitting: 39.4  Smokeless Tobacco Never Used    Goals Met:  Independence with exercise equipment Exercise tolerated well No report of cardiac concerns or symptoms Strength training completed today  Goals Unmet:  Not Applicable  Comments: Pt able to follow exercise prescription today without complaint.  Will continue to monitor for progression.    Dr. Emily [name] is Medical Director for [place] and [facility].

## 2018-06-21 ENCOUNTER — Encounter: Payer: Medicare Other | Admitting: *Deleted

## 2018-06-21 DIAGNOSIS — N400 Enlarged prostate without lower urinary tract symptoms: Secondary | ICD-10-CM | POA: Diagnosis not present

## 2018-06-21 DIAGNOSIS — Z79899 Other long term (current) drug therapy: Secondary | ICD-10-CM | POA: Diagnosis not present

## 2018-06-21 DIAGNOSIS — I213 ST elevation (STEMI) myocardial infarction of unspecified site: Secondary | ICD-10-CM

## 2018-06-21 DIAGNOSIS — Z7982 Long term (current) use of aspirin: Secondary | ICD-10-CM | POA: Diagnosis not present

## 2018-06-21 DIAGNOSIS — Z955 Presence of coronary angioplasty implant and graft: Secondary | ICD-10-CM

## 2018-06-21 DIAGNOSIS — Z7902 Long term (current) use of antithrombotics/antiplatelets: Secondary | ICD-10-CM | POA: Diagnosis not present

## 2018-06-21 NOTE — Progress Notes (Signed)
Daily Session Note  Patient Details  Name: Justin [name] MRN: [id number] Date of Birth: 1940/12/04 Referring Provider:     Cardiac Rehab from 05/26/2018 in [facility] Cardiac and Pulmonary Rehab  Referring Provider  Paraschos      Encounter Date: 06/21/2018  Check In: Session Check In - 06/21/18 0932      Check-In   Supervising physician immediately available to respond to emergencies  See telemetry face sheet for immediately available ER MD    Location  [facility]-Cardiac & Pulmonary Rehab    Staff Present  Nyoka Cowden, RN, BSN, Willette Pa, MA, RCEP, CCRP, Exercise Physiologist;Krista Frederico Hamman, RN BSN    Medication changes reported      No    Fall or balance concerns reported     No    Warm-up and Cool-down  Performed on first and last piece of equipment    Resistance Training Performed  Yes    VAD Patient?  No    PAD/SET Patient?  No      Pain Assessment   Currently in Pain?  No/denies          Social History   Tobacco Use  Smoking Status Former Smoker  . Packs/day: 2.00  . Types: Cigarettes  . Last attempt to quit: 01/17/1979  . Years since quitting: 39.4  Smokeless Tobacco Never Used    Goals Met:  Independence with exercise equipment Exercise tolerated well No report of cardiac concerns or symptoms Strength training completed today  Goals Unmet:  Not Applicable  Comments: Pt able to follow exercise prescription today without complaint.  Will continue to monitor for progression.   Dr. Emily Filbert is Medical Director for [place] and [facility].

## 2018-06-23 ENCOUNTER — Encounter: Payer: Medicare Other | Admitting: *Deleted

## 2018-06-23 DIAGNOSIS — N400 Enlarged prostate without lower urinary tract symptoms: Secondary | ICD-10-CM | POA: Diagnosis not present

## 2018-06-23 DIAGNOSIS — Z7982 Long term (current) use of aspirin: Secondary | ICD-10-CM | POA: Diagnosis not present

## 2018-06-23 DIAGNOSIS — Z955 Presence of coronary angioplasty implant and graft: Secondary | ICD-10-CM | POA: Diagnosis not present

## 2018-06-23 DIAGNOSIS — I213 ST elevation (STEMI) myocardial infarction of unspecified site: Secondary | ICD-10-CM

## 2018-06-23 DIAGNOSIS — Z7902 Long term (current) use of antithrombotics/antiplatelets: Secondary | ICD-10-CM | POA: Diagnosis not present

## 2018-06-23 DIAGNOSIS — Z79899 Other long term (current) drug therapy: Secondary | ICD-10-CM | POA: Diagnosis not present

## 2018-06-23 NOTE — Progress Notes (Signed)
Daily Session Note  Patient Details  Name: Justin [name] MRN: [id number] Date of Birth: 08/31/1941 Referring Provider:     Cardiac Rehab from 05/26/2018 in [facility] Cardiac and Pulmonary Rehab  Referring Provider  Paraschos      Encounter Date: 06/23/2018  Check In: Session Check In - 06/23/18 0937      Check-In   Supervising physician immediately available to respond to emergencies  See telemetry face sheet for immediately available ER MD    Location  [facility]-Cardiac & Pulmonary Rehab    Staff Present  Alberteen Sam, MA, RCEP, CCRP, Exercise Physiologist;Joseph Hood RCP,RRT,BSRT;Carroll Enterkin, Therapist, sports, BSN    Medication changes reported      No    Fall or balance concerns reported     No    Warm-up and Cool-down  Performed on first and last piece of equipment    Resistance Training Performed  Yes    VAD Patient?  No    PAD/SET Patient?  No      Pain Assessment   Currently in Pain?  No/denies    Multiple Pain Sites  No          Social History   Tobacco Use  Smoking Status Former Smoker  . Packs/day: 2.00  . Types: Cigarettes  . Last attempt to quit: 01/17/1979  . Years since quitting: 39.4  Smokeless Tobacco Never Used    Goals Met:  Independence with exercise equipment Exercise tolerated well No report of cardiac concerns or symptoms Strength training completed today  Goals Unmet:  Not Applicable  Comments: Pt able to follow exercise prescription today without complaint.  Will continue to monitor for progression.    Dr. Emily Filbert is Medical Director for [place] and [facility].

## 2018-06-28 DIAGNOSIS — I213 ST elevation (STEMI) myocardial infarction of unspecified site: Secondary | ICD-10-CM | POA: Diagnosis not present

## 2018-06-28 DIAGNOSIS — Z955 Presence of coronary angioplasty implant and graft: Secondary | ICD-10-CM | POA: Diagnosis not present

## 2018-06-28 DIAGNOSIS — Z7902 Long term (current) use of antithrombotics/antiplatelets: Secondary | ICD-10-CM | POA: Diagnosis not present

## 2018-06-28 DIAGNOSIS — Z79899 Other long term (current) drug therapy: Secondary | ICD-10-CM | POA: Diagnosis not present

## 2018-06-28 DIAGNOSIS — Z7982 Long term (current) use of aspirin: Secondary | ICD-10-CM | POA: Diagnosis not present

## 2018-06-28 DIAGNOSIS — N400 Enlarged prostate without lower urinary tract symptoms: Secondary | ICD-10-CM | POA: Diagnosis not present

## 2018-06-28 NOTE — Progress Notes (Signed)
Daily Session Note  Patient Details  Name: Justin [name] MRN: [id number] Date of Birth: Oct 10, 1940 Referring Provider:     Cardiac Rehab from 05/26/2018 in [facility] Cardiac and Pulmonary Rehab  Referring Provider  Paraschos      Encounter Date: 06/28/2018  Check In: Session Check In - 06/28/18 0949      Check-In   Supervising physician immediately available to respond to emergencies  See telemetry face sheet for immediately available ER MD    Location  [facility]-Cardiac & Pulmonary Rehab    Staff Present  Heath Lark, RN, BSN, CCRP;Laureen Owens Shark, BS, RRT, Respiratory Lennie Hummer, MA, RCEP, CCRP, Exercise Physiologist    Medication changes reported      No    Fall or balance concerns reported     No    Tobacco Cessation  No Change    Warm-up and Cool-down  Performed on first and last piece of equipment    Resistance Training Performed  Yes    VAD Patient?  No    PAD/SET Patient?  No      Pain Assessment   Currently in Pain?  No/denies          Social History   Tobacco Use  Smoking Status Former Smoker  . Packs/day: 2.00  . Types: Cigarettes  . Last attempt to quit: 01/17/1979  . Years since quitting: 39.4  Smokeless Tobacco Never Used    Goals Met:  Independence with exercise equipment Exercise tolerated well No report of cardiac concerns or symptoms Strength training completed today  Goals Unmet:  Not Applicable  Comments: Pt able to follow exercise prescription today without complaint.  Will continue to monitor for progression.   Dr. Emily Filbert is Medical Director for [place] and [facility].

## 2018-06-30 DIAGNOSIS — Z955 Presence of coronary angioplasty implant and graft: Secondary | ICD-10-CM

## 2018-06-30 DIAGNOSIS — Z7902 Long term (current) use of antithrombotics/antiplatelets: Secondary | ICD-10-CM | POA: Diagnosis not present

## 2018-06-30 DIAGNOSIS — Z79899 Other long term (current) drug therapy: Secondary | ICD-10-CM | POA: Diagnosis not present

## 2018-06-30 DIAGNOSIS — Z7982 Long term (current) use of aspirin: Secondary | ICD-10-CM | POA: Diagnosis not present

## 2018-06-30 DIAGNOSIS — I213 ST elevation (STEMI) myocardial infarction of unspecified site: Secondary | ICD-10-CM

## 2018-06-30 DIAGNOSIS — N400 Enlarged prostate without lower urinary tract symptoms: Secondary | ICD-10-CM | POA: Diagnosis not present

## 2018-06-30 NOTE — Progress Notes (Signed)
Daily Session Note  Patient Details  Name: GOLDMAN BIRCHALL MRN: [id number] Date of Birth: 07-Nov-1940 Referring Provider:     Cardiac Rehab from 05/26/2018 in [facility] Cardiac and Pulmonary Rehab  Referring Provider  Paraschos      Encounter Date: 06/30/2018  Check In: Session Check In - 06/30/18 0954      Check-In   Supervising physician immediately available to respond to emergencies  See telemetry face sheet for immediately available ER MD    Location  [facility]-Cardiac & Pulmonary Rehab    Staff Present  Justin Mend RCP,RRT,BSRT;Carroll Enterkin, RN, BSN;Laureen Owens Shark, BS, RRT, Respiratory Lennie Hummer, MA, RCEP, CCRP, Exercise Physiologist    Medication changes reported      No    Fall or balance concerns reported     No    Tobacco Cessation  No Change    Warm-up and Cool-down  Performed on first and last piece of equipment    Resistance Training Performed  Yes    VAD Patient?  No    PAD/SET Patient?  No      Pain Assessment   Currently in Pain?  No/denies          Social History   Tobacco Use  Smoking Status Former Smoker  . Packs/day: 2.00  . Types: Cigarettes  . Last attempt to quit: 01/17/1979  . Years since quitting: 39.4  Smokeless Tobacco Never Used    Goals Met:  Independence with exercise equipment Exercise tolerated well No report of cardiac concerns or symptoms Strength training completed today  Goals Unmet:  Not Applicable  Comments: Pt able to follow exercise prescription today without complaint.  Will continue to monitor for progression.   Dr. Emily Filbert is Medical Director for [place] and [facility].

## 2018-07-05 ENCOUNTER — Encounter: Payer: Medicare Other | Admitting: *Deleted

## 2018-07-05 DIAGNOSIS — Z79899 Other long term (current) drug therapy: Secondary | ICD-10-CM | POA: Diagnosis not present

## 2018-07-05 DIAGNOSIS — I213 ST elevation (STEMI) myocardial infarction of unspecified site: Secondary | ICD-10-CM | POA: Diagnosis not present

## 2018-07-05 DIAGNOSIS — N400 Enlarged prostate without lower urinary tract symptoms: Secondary | ICD-10-CM | POA: Diagnosis not present

## 2018-07-05 DIAGNOSIS — Z7902 Long term (current) use of antithrombotics/antiplatelets: Secondary | ICD-10-CM | POA: Diagnosis not present

## 2018-07-05 DIAGNOSIS — Z955 Presence of coronary angioplasty implant and graft: Secondary | ICD-10-CM

## 2018-07-05 DIAGNOSIS — Z7982 Long term (current) use of aspirin: Secondary | ICD-10-CM | POA: Diagnosis not present

## 2018-07-05 NOTE — Progress Notes (Signed)
Daily Session Note  Patient Details  Name: Justin [name] MRN: [id number] Date of Birth: 10/04/1940 Referring Provider:     Cardiac Rehab from 05/26/2018 in [facility] Cardiac and Pulmonary Rehab  Referring Provider  Paraschos      Encounter Date: 07/05/2018  Check In: Session Check In - 07/05/18 0920      Check-In   Supervising physician immediately available to respond to emergencies  See telemetry face sheet for immediately available ER MD    Location  [facility]-Cardiac & Pulmonary Rehab    Staff Present  Krista Spencer, RN BSN;Amanda Sommer, BA, ACSM CEP, Exercise Physiologist;Jessica Hawkins, MA, RCEP, CCRP, Exercise Physiologist    Medication changes reported      No    Fall or balance concerns reported     No    Warm-up and Cool-down  Performed on first and last piece of equipment    Resistance Training Performed  Yes    VAD Patient?  No    PAD/SET Patient?  No      Pain Assessment   Currently in Pain?  No/denies          Social History   Tobacco Use  Smoking Status Former Smoker  . Packs/day: 2.00  . Types: Cigarettes  . Last attempt to quit: 01/17/1979  . Years since quitting: 39.4  Smokeless Tobacco Never Used    Goals Met:  Independence with exercise equipment Exercise tolerated well No report of cardiac concerns or symptoms Strength training completed today  Goals Unmet:  Not Applicable  Comments: Pt able to follow exercise prescription today without complaint.  Will continue to monitor for progression.    Dr. Mark Miller is Medical Director for [facility] and [facility]. 

## 2018-07-07 DIAGNOSIS — Z955 Presence of coronary angioplasty implant and graft: Secondary | ICD-10-CM

## 2018-07-07 DIAGNOSIS — I213 ST elevation (STEMI) myocardial infarction of unspecified site: Secondary | ICD-10-CM | POA: Diagnosis not present

## 2018-07-07 DIAGNOSIS — Z7982 Long term (current) use of aspirin: Secondary | ICD-10-CM | POA: Diagnosis not present

## 2018-07-07 DIAGNOSIS — N400 Enlarged prostate without lower urinary tract symptoms: Secondary | ICD-10-CM | POA: Diagnosis not present

## 2018-07-07 DIAGNOSIS — Z7902 Long term (current) use of antithrombotics/antiplatelets: Secondary | ICD-10-CM | POA: Diagnosis not present

## 2018-07-07 DIAGNOSIS — Z79899 Other long term (current) drug therapy: Secondary | ICD-10-CM | POA: Diagnosis not present

## 2018-07-07 NOTE — Progress Notes (Signed)
Daily Session Note  Patient Details  Name: LERRY CORDREY MRN: [id number] Date of Birth: 09-14-1940 Referring Provider:     Cardiac Rehab from 05/26/2018 in [facility] Cardiac and Pulmonary Rehab  Referring Provider  Paraschos      Encounter Date: 07/07/2018  Check In: Session Check In - 07/07/18 3014      Check-In   Supervising physician immediately available to respond to emergencies  See telemetry face sheet for immediately available ER MD    Location  [facility]-Cardiac & Pulmonary Rehab    Staff Present  Nyoka Cowden, RN, BSN, MA;Joseph [address] [place], [place], RCEP, CCRP, Exercise Physiologist    Medication changes reported      No    Fall or balance concerns reported     No    Tobacco Cessation  No Change    Warm-up and Cool-down  Performed on first and last piece of equipment    Resistance Training Performed  Yes    VAD Patient?  No      Pain Assessment   Currently in Pain?  No/denies    Multiple Pain Sites  No          Social History   Tobacco Use  Smoking Status Former Smoker  . Packs/day: 2.00  . Types: Cigarettes  . Last attempt to quit: 01/17/1979  . Years since quitting: 39.4  Smokeless Tobacco Never Used    Goals Met:  Independence with exercise equipment Exercise tolerated well No report of cardiac concerns or symptoms Strength training completed today  Goals Unmet:  Not Applicable  Comments: Pt able to follow exercise prescription today without complaint.  Will continue to monitor for progression.   Dr. Emily Filbert is Medical Director for [place] and [facility].

## 2018-07-12 ENCOUNTER — Encounter: Payer: Medicare Other | Attending: Cardiology | Admitting: *Deleted

## 2018-07-12 DIAGNOSIS — N400 Enlarged prostate without lower urinary tract symptoms: Secondary | ICD-10-CM | POA: Diagnosis not present

## 2018-07-12 DIAGNOSIS — Z8551 Personal history of malignant neoplasm of bladder: Secondary | ICD-10-CM | POA: Diagnosis not present

## 2018-07-12 DIAGNOSIS — Z7902 Long term (current) use of antithrombotics/antiplatelets: Secondary | ICD-10-CM | POA: Insufficient documentation

## 2018-07-12 DIAGNOSIS — Z87442 Personal history of urinary calculi: Secondary | ICD-10-CM | POA: Diagnosis not present

## 2018-07-12 DIAGNOSIS — I213 ST elevation (STEMI) myocardial infarction of unspecified site: Secondary | ICD-10-CM

## 2018-07-12 DIAGNOSIS — Z955 Presence of coronary angioplasty implant and graft: Secondary | ICD-10-CM | POA: Insufficient documentation

## 2018-07-12 DIAGNOSIS — Z79899 Other long term (current) drug therapy: Secondary | ICD-10-CM | POA: Insufficient documentation

## 2018-07-12 DIAGNOSIS — Z7982 Long term (current) use of aspirin: Secondary | ICD-10-CM | POA: Insufficient documentation

## 2018-07-12 DIAGNOSIS — H9191 Unspecified hearing loss, right ear: Secondary | ICD-10-CM | POA: Diagnosis not present

## 2018-07-12 DIAGNOSIS — Z87891 Personal history of nicotine dependence: Secondary | ICD-10-CM | POA: Diagnosis not present

## 2018-07-12 DIAGNOSIS — M1990 Unspecified osteoarthritis, unspecified site: Secondary | ICD-10-CM | POA: Diagnosis not present

## 2018-07-12 DIAGNOSIS — I10 Essential (primary) hypertension: Secondary | ICD-10-CM | POA: Diagnosis not present

## 2018-07-12 NOTE — Progress Notes (Signed)
Daily Session Note  Patient Details  Name: Justin [name] MRN: [id number] Date of Birth: 08-19-1941 Referring Provider:     Cardiac Rehab from 05/26/2018 in [facility] Cardiac and Pulmonary Rehab  Referring Provider  Paraschos      Encounter Date: 07/12/2018  Check In: Session Check In - 07/12/18 0933      Check-In   Supervising physician immediately available to respond to emergencies  See telemetry face sheet for immediately available ER MD    Location  [facility]-Cardiac & Pulmonary Rehab    Staff Present  Alberteen Sam, MA, RCEP, CCRP, Exercise Physiologist;Amanda Oletta Darter, BA, ACSM CEP, Exercise Physiologist;Susanne Bice, RN, BSN, CCRP;Other   Pryor Montes Durrell   Medication changes reported      No    Fall or balance concerns reported     No    Warm-up and Cool-down  Performed on first and last piece of equipment    Resistance Training Performed  Yes    VAD Patient?  No    PAD/SET Patient?  No      Pain Assessment   Currently in Pain?  No/denies          Social History   Tobacco Use  Smoking Status Former Smoker  . Packs/day: 2.00  . Types: Cigarettes  . Last attempt to quit: 01/17/1979  . Years since quitting: 39.5  Smokeless Tobacco Never Used    Goals Met:  Independence with exercise equipment Exercise tolerated well No report of cardiac concerns or symptoms Strength training completed today  Goals Unmet:  Not Applicable  Comments: Pt able to follow exercise prescription today without complaint.  Will continue to monitor for progression.    Dr. Emily Filbert is Medical Director for [place] and [facility].

## 2018-07-13 ENCOUNTER — Encounter: Payer: Self-pay | Admitting: *Deleted

## 2018-07-13 DIAGNOSIS — Z955 Presence of coronary angioplasty implant and graft: Secondary | ICD-10-CM

## 2018-07-13 DIAGNOSIS — I213 ST elevation (STEMI) myocardial infarction of unspecified site: Secondary | ICD-10-CM

## 2018-07-13 NOTE — Progress Notes (Signed)
Cardiac Individual Treatment Plan  Patient Details  Name: Justin [name] MRN: [id number] Date of Birth: 09-Aug-1941 Referring Provider:     Cardiac Rehab from 05/26/2018 in [facility] Cardiac and Pulmonary Rehab  Referring Provider  Paraschos      Initial Encounter Date:    Cardiac Rehab from 05/26/2018 in [facility] Cardiac and Pulmonary Rehab  Date  05/26/18      Visit Diagnosis: ST elevation myocardial infarction (STEMI), unspecified artery ([place])  S/P coronary artery stent placement  Patient's Home Medications on Admission:  Current Outpatient Medications:  .  aspirin 81 MG chewable tablet, Chew 1 tablet (81 mg total) by mouth daily., Disp: , Rfl:  .  atorvastatin (LIPITOR) 80 MG tablet, Take 1 tablet (80 mg total) by mouth daily at 6 PM., Disp: 30 tablet, Rfl: 0 .  Cranberry-Vitamin C-Vitamin E (TH CRANBERRY CONCENTRATE PO), Take 2 tablets by mouth daily. , Disp: , Rfl:  .  finasteride (PROSCAR) 5 MG tablet, Take 1 tablet (5 mg total) by mouth daily. pm (Patient not taking: Reported on 05/26/2018), Disp: 30 tablet, Rfl: 3 .  lisinopril (PRINIVIL,ZESTRIL) 20 MG tablet, Take 20 mg by mouth daily. , Disp: , Rfl:  .  metoprolol tartrate (LOPRESSOR) 25 MG tablet, Take 1 tablet (25 mg total) by mouth 2 (two) times daily., Disp: 60 tablet, Rfl: 0 .  OVER THE COUNTER MEDICATION, Take 1 tablet by mouth 2 (two) times daily. Prosta-Rye Plus, Disp: , Rfl:  .  tamsulosin (FLOMAX) 0.4 MG CAPS capsule, Take 1 capsule (0.4 mg total) by mouth daily., Disp: 30 capsule, Rfl: 3 .  ticagrelor (BRILINTA) 90 MG TABS tablet, Take 1 tablet (90 mg total) by mouth 2 (two) times daily., Disp: 60 tablet, Rfl: 0  Past Medical History: Past Medical History:  Diagnosis Date  . Arthritis   . Bell's palsy   . Bell's palsy   . BPH (benign prostatic hyperplasia)   . Cancer ([place])    bladder  . Deaf    right ear  . Dizziness   . History of urinary urgency   . Hydronephrosis, bilateral   . Hypertension   . Kidney stone    . Neuroma    acoustic  . 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 Urinary frequency   . Urinary tract infection     Tobacco Use: Social History   Tobacco Use  Smoking Status Former Smoker  . Packs/day: 2.00  . Types: Cigarettes  . Last attempt to quit: 01/17/1979  . Years since quitting: 39.5  Smokeless Tobacco Never Used    Labs: Recent Chemical engineer    Labs for ITP Cardiac and Pulmonary Rehab Latest Ref Rng & Units 05/15/2018   Hemoglobin A1c 4.8 - 5.6 % 6.2(H)       Exercise Target Goals: Exercise Program Goal: Individual exercise prescription set using results from initial 6 min walk test and THRR while considering  patient's activity barriers and safety.   Exercise Prescription Goal: Initial exercise prescription builds to 30-45 minutes a day of aerobic activity, 2-3 days per week.  Home exercise guidelines will be given to patient during program as part of exercise prescription that the participant will acknowledge.  Activity Barriers & Risk Stratification: Activity Barriers & Cardiac Risk Stratification - 05/26/18 1324      Activity Barriers & Cardiac Risk Stratification   Activity Barriers  Balance Concerns    Cardiac Risk Stratification  Moderate       6 Minute  Walk: 6 Minute Walk    Row Name 05/26/18 1243         6 Minute Walk   Phase  Initial     Distance  1045 feet     Walk Time  6 minutes     # of Rest Breaks  0     MPH  1.98     METS  1.94     RPE  11     Perceived Dyspnea   0     VO2 Peak  6.78     Symptoms  No     Resting HR  61 bpm     Resting BP  104/56     Resting Oxygen Saturation   96 %     Exercise Oxygen Saturation  during 6 min walk  97 %     Max Ex. HR  103 bpm     Max Ex. BP  134/60     2 Minute Post BP  100/58        Oxygen Initial Assessment:   Oxygen Re-Evaluation:   Oxygen Discharge (Final Oxygen Re-Evaluation):   Initial Exercise Prescription: Initial Exercise Prescription - 05/26/18 1200      Date  of Initial Exercise RX and Referring Provider   Date  05/26/18    Referring Provider  Paraschos      Treadmill   MPH  1.3    Grade  0    Minutes  15    METs  2      NuStep   Level  2    SPM  80    Minutes  15    METs  2      REL-XR   Level  2    Speed  50    Minutes  15    METs  2      Prescription Details   Frequency (times per week)  3    Duration  Progress to 45 minutes of aerobic exercise without signs/symptoms of physical distress      Intensity   THRR 40-80% of Max Heartrate  94-126    Ratings of Perceived Exertion  11-13    Perceived Dyspnea  0-4      Resistance Training   Training Prescription  Yes    Weight  3 lb    Reps  10-15       Perform Capillary Blood Glucose checks as needed.  Exercise Prescription Changes: Exercise Prescription Changes    Row Name 05/26/18 1200 06/06/18 1500 06/14/18 1000 06/21/18 1100 07/05/18 1500     Response to Exercise   Blood Pressure (Admit)  104/56  112/66  -  112/64  124/64   Blood Pressure (Exercise)  134/60  134/62  -  140/80  140/62   Blood Pressure (Exit)  100/58  104/56  -  118/60  100/58   Heart Rate (Admit)  67 bpm  80 bpm  -  85 bpm  66 bpm   Heart Rate (Exercise)  103 bpm  100 bpm  -  106 bpm  86 bpm   Heart Rate (Exit)  59 bpm  65 bpm  -  84 bpm  63 bpm   Oxygen Saturation (Admit)  96 %  -  -  -  -   Oxygen Saturation (Exercise)  97 %  -  -  -  -   Oxygen Saturation (Exit)  98 %  -  -  -  -   Rating  of Perceived Exertion (Exercise)  11  17  -  15  13   Symptoms  -  fatigue  -  none  none   Duration  -  Continue with 30 min of aerobic exercise without signs/symptoms of physical distress.  -  Continue with 30 min of aerobic exercise without signs/symptoms of physical distress.  Continue with 30 min of aerobic exercise without signs/symptoms of physical distress.   Intensity  -  THRR unchanged  -  THRR unchanged  THRR unchanged     Progression   Progression  -  Continue to progress workloads to maintain  intensity without signs/symptoms of physical distress.  -  Continue to progress workloads to maintain intensity without signs/symptoms of physical distress.  Continue to progress workloads to maintain intensity without signs/symptoms of physical distress.   Average METs  -  2.4  -  2.67  2.72     Resistance Training   Training Prescription  -  Yes  -  Yes  Yes   Weight  -  3 lb  -  3 lb  3 lbs   Reps  -  10-15  -  10-15  10-15     Interval Training   Interval Training  -  No  -  No  No     Treadmill   MPH  -  1.3  -  1.3  1.3   Grade  -  0  -  0  1   Minutes  -  15  -  15  15   METs  -  1.99  -  2  2.17     NuStep   Level  -  2  -  3  3   Minutes  -  15  -  15  15   METs  -  2.2  -  3.1  2.9     REL-XR   Level  -  2  -  5  5   Minutes  -  15  -  15  15   METs  -  3  -  2.9  3.1     Home Exercise Plan   Plans to continue exercise at  -  -  [place] (comment[place]  [place]comment[place]  [place] (comment[place]   Frequency  -  -  Add 2 additional days to program exercise sessions.  Add 2 additional days to program exercise sessions.  Add 2 additional days to program exercise sessions.   Initial Home Exercises Provided  -  -  06/14/18  06/14/18  06/14/18      Exercise Comments: Exercise Comments    Row Name 05/30/18 0804 06/28/18 1048         Exercise Comments  First full day of exercise!  Patient was oriented to gym and equipment including functions, settings, policies, and procedures.  Patient's individual exercise prescription and treatment plan were reviewed.  All starting workloads were established based on the results of the 6 minute walk test done at initial orientation visit.  The plan for exercise progression was also introduced and progression will be customized based on patient's performance and goals.  Justin [name] reported passing blood after exercising too hard last session. He will be mindful of this during exercise and  with increasing workloads. He had bladder cancer and reports that this happens when he does too much.  Exercise Goals and Review: Exercise Goals    Row Name 05/26/18 1242             Exercise Goals   Increase Physical Activity  Yes       Intervention  Provide advice, education, support and counseling about physical activity/exercise needs.;Develop an individualized exercise prescription for aerobic and resistive training based on initial evaluation findings, risk stratification, comorbidities and participant's personal goals.       Expected Outcomes  Short Term: Attend rehab on a regular basis to increase amount of physical activity.;Long Term: Add in home exercise to make exercise part of routine and to increase amount of physical activity.;Long Term: Exercising regularly at least 3-5 days a week.       Increase Strength and Stamina  Yes       Intervention  Provide advice, education, support and counseling about physical activity/exercise needs.;Develop an individualized exercise prescription for aerobic and resistive training based on initial evaluation findings, risk stratification, comorbidities and participant's personal goals.       Expected Outcomes  Short Term: Increase workloads from initial exercise prescription for resistance, speed, and METs.;Short Term: Perform resistance training exercises routinely during rehab and add in resistance training at home;Long Term: Improve cardiorespiratory fitness, muscular endurance and strength as measured by increased METs and functional capacity (6MWT)       Able to understand and use rate of perceived exertion (RPE) scale  Yes       Intervention  Provide education and explanation on how to use RPE scale       Expected Outcomes  Short Term: Able to use RPE daily in rehab to express subjective intensity level;Long Term:  Able to use RPE to guide intensity level when exercising independently       Able to understand and use Dyspnea scale   Yes       Intervention  Provide education and explanation on how to use Dyspnea scale       Expected Outcomes  Short Term: Able to use Dyspnea scale daily in rehab to express subjective sense of shortness of breath during exertion;Long Term: Able to use Dyspnea scale to guide intensity level when exercising independently       Knowledge and understanding of Target Heart Rate Range (THRR)  Yes       Intervention  Provide education and explanation of THRR including how the numbers were predicted and where they are located for reference       Expected Outcomes  Short Term: Able to state/look up THRR;Short Term: Able to use daily as guideline for intensity in rehab;Long Term: Able to use THRR to govern intensity when exercising independently       Able to check pulse independently  Yes       Intervention  Provide education and demonstration on how to check pulse in carotid and radial arteries.;Review the importance of being able to check your own pulse for safety during independent exercise       Expected Outcomes  Short Term: Able to explain why pulse checking is important during independent exercise;Long Term: Able to check pulse independently and accurately       Understanding of Exercise Prescription  Yes       Intervention  Provide education, explanation, and written materials on patient's individual exercise prescription       Expected Outcomes  Short Term: Able to explain program exercise prescription;Long Term: Able to explain home exercise prescription to exercise independently  Exercise Goals Re-Evaluation : Exercise Goals Re-Evaluation    Row Name 05/30/18 0804 06/06/18 1450 06/14/18 1043 06/21/18 1148 06/28/18 1018     Exercise Goal Re-Evaluation   Exercise Goals Review  Increase Physical Activity;Increase Strength and Stamina;Able to understand and use rate of perceived exertion (RPE) scale;Knowledge and understanding of Target Heart Rate Range (THRR);Understanding of Exercise  Prescription  Increase Physical Activity;Increase Strength and Stamina;Understanding of Exercise Prescription  Increase Physical Activity;Able to understand and use rate of perceived exertion (RPE) scale;Knowledge and understanding of Target Heart Rate Range (THRR);Increase Strength and Stamina  Increase Physical Activity;Increase Strength and Stamina;Understanding of Exercise Prescription  Increase Physical Activity;Increase Strength and Stamina;Understanding of Exercise Prescription   Comments  Reviewed RPE scale, THR and program prescription with pt today.  Pt voiced understanding and was given a copy of goals to take home.   Justin [name] is off to a good start in rehab.  He is switching to the 9am class as he is struggling to get up early enough.  We will continue to monitor his progress.   Reviewed home exercise with pt today.  Pt plans to walk for exercise.  Reviewed THR, pulse, RPE, sign and symptoms, NTG use, and when to call 911 or MD.  Also discussed weather considerations and indoor options.  Pt voiced understanding.  Justin [name] is doing well in rehab.  He is now up to level 5 on the XR and level 3 on the NuStep.  We will try to start to increase his treadmill and continue to monitor his progression.   Justin [name] is doing well in rehab. He enjoys the XR machine most. He was scared to walk on the treadmill and has overcome that fear. We will remind him to try to relax on the treadmill. We will continue to monitor his progression.   Expected Outcomes  Short: Use RPE daily to regulate intensity. Long: Follow program prescription in THR.  Short: Continue to attend new class regularly.  Long: Continue to increase strength and stamina.   Short - walk at least twice per week outside program sessions Long - maintain exercise on his own  Short: Increase treadmill.  Long: Continue to increase walking at home.   Short: Increase treadmill. Long: Continue to increase walking at home. Living situation interferes with walking more but  he will try.   Justin [name] Name 06/30/18 1025 07/05/18 1549           Exercise Goal Re-Evaluation   Exercise Goals Review  Increase Physical Activity;Increase Strength and Stamina;Understanding of Exercise Prescription  Increase Physical Activity;Increase Strength and Stamina;Understanding of Exercise Prescription      Comments  Justin [name] is walking around a quarter of a mile track for 20 minutes on the days he is not here. Justin [name] will steadily increase to 30 minutes. He will go to the [facility] in [place] when the weather does not permit his walking outside.   Justin [name] continues to do well in rehab.  He is now up to level 3 on the NuStep and added a 1% grade on the treadmill.  We will continue to monitor his progression.       Expected Outcomes  Short: Increase walk time to 30 minutes 3 additional days per week. Long: Exercise independently and continue to increase and vary exercises at the [facility] in [place] (bike, treadmill, stepper).  Short: Increase speed on treadmill.  Long: Continue to increase strength and stamina.          Discharge Exercise Prescription (  Final Exercise Prescription Changes): Exercise Prescription Changes - 07/05/18 1500      Response to Exercise   Blood Pressure (Admit)  124/64    Blood Pressure (Exercise)  140/62    Blood Pressure (Exit)  100/58    Heart Rate (Admit)  66 bpm    Heart Rate (Exercise)  86 bpm    Heart Rate (Exit)  63 bpm    Rating of Perceived Exertion (Exercise)  13    Symptoms  none    Duration  Continue with 30 min of aerobic exercise without signs/symptoms of physical distress.    Intensity  THRR unchanged      Progression   Progression  Continue to progress workloads to maintain intensity without signs/symptoms of physical distress.    Average METs  2.72      Resistance Training   Training Prescription  Yes    Weight  3 lbs    Reps  10-15      Interval Training   Interval Training  No      Treadmill   MPH  1.3    Grade  1    Minutes   15    METs  2.17      NuStep   Level  3    Minutes  15    METs  2.9      REL-XR   Level  5    Minutes  15    METs  3.1      Home Exercise Plan   Plans to continue exercise at  Home (comment)   [place]   Frequency  Add 2 additional days to program exercise sessions.    Initial Home Exercises Provided  06/14/18       Nutrition:  Target Goals: Understanding of nutrition guidelines, daily intake of sodium '1500mg'$ , cholesterol '200mg'$ , calories 30% from fat and 7% or less from saturated fats, daily to have 5 or more servings of fruits and vegetables.  Biometrics: Pre Biometrics - 05/26/18 1241      Pre Biometrics   Height  '5\' 7"'$  (1.702 m)    Weight  195 lb 12.8 oz (88.8 kg)    Waist Circumference  40.5 inches    Hip Circumference  43 inches    Waist to Hip Ratio  0.94 %    BMI (Calculated)  30.66        Nutrition Therapy Plan and Nutrition Goals: Nutrition Therapy & Goals - 06/01/18 1449      Nutrition Therapy   Diet  TLC    Drug/Food Interactions  Statins/Certain Fruits    Protein (specify units)  12oz    Fiber  30 grams    Whole Grain Foods  3 servings   eats whole grains regularly   Saturated Fats  16 max. grams    Fruits and Vegetables  6 servings/day   8 ideal; eats multiple servings of fruits and vegetables daily   Sodium  1500 grams      Personal Nutrition Goals   Nutrition Goal  Continue to reduce intake of processed snack foods and added sugars, and to limit sodium intake, great job    Personal Goal #2  Work with your housemates to [employer] a wider variety of vegetables each week    Comments  He and his wife live with another couple who are not as health conscious, and the families share cooking responsibilites, so eating a diet they prefer is sometimes challenging. They choose whole grain  products, eat a variety of fruits/ vegetables, and choose lower sugar dessert items. They recently purchased a dehydrator and are in the process of making  their own dried fruit and trail mix without added sugar. They choose SF beverages and eat at home 99% of the time. They choose low sodium options when they are able      Intervention Plan   Intervention  Prescribe, educate and counsel regarding individualized specific dietary modifications aiming towards targeted core components such as weight, hypertension, lipid management, diabetes, heart failure and other comorbidities.;Nutrition handout(s) given to patient.   no-salt seasoning blends handout; low sodium nutrition therapy handout   Expected Outcomes  Short Term Goal: Understand basic principles of dietary content, such as calories, fat, sodium, cholesterol and nutrients.;Short Term Goal: A plan has been developed with personal nutrition goals set during dietitian appointment.;Long Term Goal: Adherence to prescribed nutrition plan.       Nutrition Assessments: Nutrition Assessments - 07/12/18 1110      MEDFICTS Scores   Pre Score  56    Post Score  --   declined nutrition survery      Nutrition Goals Re-Evaluation: Nutrition Goals Re-Evaluation    [place] Name 06/01/18 1457 06/16/18 1040           Goals   Nutrition Goal  Continue to reduce intake of processed snack foods and added sugars, and to limit sodium intake, great job  Continue to reduce intake of processed snack foods and added sugars and to limit sodium; work with your housemates to [employer] a wider variety of vegetables each week      Comment  He and his wife have made significant changes to their diet in recent years and try to eat minimal processed foods other than the occasional snack food.  He reports the living situation with his housemates to be declining, and he and his wife are looking for another place to live. This has put stress on him and limits ability to cook the foods he desires but he is still making an effort to eat minimally processed foods low in salt and sugar. He is working to lose weight and would like some  recipe ideas Eating Well [place]      Expected Outcome  He will continue current diet practices and make the best decisions for his health food-wise, even if others in the household are not making choices that are as nutrient-dense  He will continue to control the variables that he is able to control with his current living situation until he is able to move. Continue to eat a variety of foods that are minimally processed and lower in sugar and salt. He and his wife will utilize recipe ideas provided to help them make different heart healthy recipes        Personal Goal #2 Re-Evaluation   Personal Goal #2  Work with your housemates to cook a wider variety of vegetables each week  -         Nutrition Goals Discharge (Final Nutrition Goals Re-Evaluation): Nutrition Goals Re-Evaluation - 06/16/18 1040      Goals   Nutrition Goal  Continue to reduce intake of processed snack foods and added sugars and to limit sodium; work with your housemates to cook a wider variety of vegetables each week    Comment  He reports the living situation with his housemates to be declining, and he and his wife are looking for another place to  live. This has put stress on him and limits ability to cook the foods he desires but he is still making an effort to eat minimally processed foods low in salt and sugar. He is working to lose weight and would like some recipe ideas   Eating Well [place]   Expected Outcome  He will continue to control the variables that he is able to control with his current living situation until he is able to move. Continue to eat a variety of foods that are minimally processed and lower in sugar and salt. He and his wife will utilize recipe ideas provided to help them make different heart healthy recipes       Psychosocial: Target Goals: Acknowledge presence or absence of significant depression and/or stress, maximize coping  skills, provide positive support system. Participant is able to verbalize types and ability to use techniques and skills needed for reducing stress and depression.   Initial Review & Psychosocial Screening: Initial Psych Review & Screening - 06/28/18 1027      Initial Review   Current issues with  --    Source of Stress Concerns  --    Comments  --      Family Dynamics   Good Support System?  Yes      Barriers   Psychosocial barriers to participate in program  --      Screening Interventions   Interventions  --       Quality of Life Scores:  Quality of Life - 07/12/18 1113      Quality of Life   Select  Quality of Life      Quality of Life Scores   Health/Function Pre  14.96 %    Health/Function Post  13 %    Health/Function % Change  -13.1 %    Socioeconomic Pre  12.6 %    Socioeconomic Post  11.33 %    Socioeconomic % Change   -10.08 %    Psych/Spiritual Pre  11 %    Psych/Spiritual Post  13.71 %    Psych/Spiritual % Change  24.64 %    Family Pre  14.6 %    Family Post  15.4 %    Family % Change  5.48 %    GLOBAL Pre  14.13 %    GLOBAL Post  13.21 %    GLOBAL % Change  -6.51 %      Scores of 19 and below usually indicate a poorer quality of life in these areas.  A difference of  2-3 points is a clinically meaningful difference.  A difference of 2-3 points in the total score of the Quality of Life Index has been associated with significant improvement in overall quality of life, self-image, physical symptoms, and general health in studies assessing change in quality of life.  PHQ-9: Recent Review Flowsheet Data    Depression screen [facility] 2/9 07/12/2018 05/26/2018   Decreased Interest 0 2   Down, Depressed, Hopeless 1 0   PHQ - 2 Score 1 2   Altered sleeping 0 0   Tired, decreased energy 0 1   Change in appetite 0 1   Feeling bad or failure about yourself  0 1   Trouble concentrating 0 2   Moving slowly or fidgety/restless 0 0   Suicidal thoughts 0 0   PHQ-9  Score 1 7   Difficult doing work/chores Not difficult at all Not difficult at all     Interpretation  of Total Score  Total Score Depression Severity:  1-4 = Minimal depression, 5-9 = Mild depression, 10-14 = Moderate depression, 15-19 = Moderately severe depression, 20-27 = Severe depression   Psychosocial Evaluation and Intervention: Psychosocial Evaluation - 06/07/18 1049      Psychosocial Evaluation & Interventions   Interventions  Encouraged to exercise with the program and follow exercise prescription;Stress management education    Comments  Counselor met with Mr. Carrington Goodpasture) today for initial psychosocial evaluation.  He is a 77 year old who had a heart attack and stent inserted on 9/7.  He has some kidney problems and a history of a brain tumor and hearing/sight problems in addition to his cardiac condition.  Tilden has a spouse of 86 years and some friends that he lives with for his support system.  He has (2) children who live out of state.  Nicholad reports sleeping "lousy" with maybe 3-4 hours per night.  He has a good appetite. Rahmel denies a history of depression or anxiety or any current symptoms and states he is generally in a good mood.  However, Justin [name] was very tearful in speaking with this counselor and has had some significant losses with the death of all of his immediate family members and a best friend who committed suicide years ago - but still impactful.  His primary stressors are his health and finances.  Andra's goals are to "survive" this class and be healthier in general.  He will be followed by staff    Expected Outcomes  Short:  Alamin will exercise for his health and mental health and hopefully improve his sleep as well.  Long:  Dashel will develop healthier lifestyle routines for his health and mental health.    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 He says the only way that will get better is if he moves.   Counselor follow up today with Justin [name] reporting stressful living conditions with friends the past 5-6 years.  He reports the male owner of the home "despises me."  Counselor processed other options and explored ways to cope with Justin [name] today.  He attended the stress management education last week and counselor encouraged him to read over the materials.  Nester was able to identify positive ways to deal with his stress.  He continues to have difficulty sleeping and reports refusal of a CPAP as he is suspicious of medical providers at times.  Counselor encouraged possibly finding a therapist and he agreed to discuss this further as to whether he is willing to do so.  He denies SI/HI at this time - and was willing to process feelings well today.  Counselor will follow.     Expected Outcomes  Short: Recommend talking with psychosocial counselor next class. Long: Continue exercising for stress reduction and improved sleep.  Short: Recommend talking with psychosocial counselor next class. Long: Continue exercising for stress reduction and improved sleep.  Short:  Anel will read and practice positive coping strategies and determine what he can and cannot control.  He will exercise for his health and  mental health.  Long:   Makana may consider counseling and reconsider a CPAP to improve his quality of life overall.       Interventions  -  Encouraged to attend Cardiac Rehabilitation for the exercise;Relaxation education;Stress management education  -     Continue Psychosocial Services   -  Follow up required by counselor  -        Psychosocial Discharge (Final Psychosocial  Re-Evaluation): Psychosocial Re-Evaluation - 06/30/18 1050      Psychosocial Re-Evaluation   Current issues with  Current Sleep Concerns;History of Depression;Current Depression;Current Stress Concerns    Comments  Counselor follow up today with Justin [name] reporting stressful living conditions with friends the past 5-6 years.  He reports the male owner of the home "despises me."  Counselor processed other options and explored ways to cope with Justin [name] today.  He attended the stress management education last week and counselor encouraged him to read over the materials.  Justin [name] was able to identify positive ways to deal with his stress.  He continues to have difficulty sleeping and reports refusal of a CPAP as he is suspicious of medical providers at times.  Counselor encouraged possibly finding a therapist and he agreed to discuss this further as to whether he is willing to do so.  He denies SI/HI at this time - and was willing to process feelings well today.  Counselor will follow.    Expected Outcomes  Short:  Justin [name] will read and practice positive coping strategies and determine what he can and cannot control.  He will exercise for his health and mental health.  Long:   Justin [name] may consider counseling and reconsider a CPAP to improve his quality of life overall.         Vocational Rehabilitation: Provide vocational rehab assistance to qualifying candidates.   Vocational Rehab Evaluation & Intervention: Vocational Rehab - 05/26/18 1305      Initial Vocational Rehab Evaluation & Intervention   Assessment shows need for Vocational Rehabilitation  No       Education: Education Goals: Education classes will be provided on a variety of topics geared toward better understanding of heart health and risk factor modification. Participant will state understanding/return demonstration of topics presented as noted by education test scores.  Learning Barriers/Preferences: Learning Barriers/Preferences - 05/26/18  1302      Learning Barriers/Preferences   Learning Barriers  Hearing    Learning Preferences  Verbal Instruction       Education Topics:  AED/CPR: - Group verbal and written instruction with the use of models to demonstrate the basic use of the AED with the basic ABC's of resuscitation.   Cardiac Rehab from 07/12/2018 in [facility] Cardiac and Pulmonary Rehab  Date  07/07/18  Educator  SB  Instruction Review Code  1- Verbalizes Understanding      General Nutrition Guidelines/Fats and Fiber: -Group instruction provided by verbal, written material, models and posters to present the general guidelines for heart healthy nutrition. Gives an explanation and review of dietary fats and fiber.   Cardiac Rehab from 07/12/2018 in [facility] Cardiac and Pulmonary Rehab  Date  07/12/18  Educator  LB  Instruction Review Code  1- Verbalizes Understanding      Controlling Sodium/Reading Food Labels: -Group verbal and written material supporting the discussion of sodium use in heart healthy nutrition. Review and explanation with models, verbal and written materials for utilization of the food label.   Exercise Physiology & General Exercise Guidelines: - Group verbal and  written instruction with models to review the exercise physiology of the cardiovascular system and associated critical values. Provides general exercise guidelines with specific guidelines to those with heart or lung disease.    Cardiac Rehab from 07/12/2018 in [facility] Cardiac and Pulmonary Rehab  Date  06/01/18  Educator  [facility]  Instruction Review Code  1- Verbalizes Understanding      Aerobic Exercise & Resistance Training: - Gives group verbal and written instruction on the various components of exercise. Focuses on aerobic and resistive training programs and the benefits of this training and how to safely progress through these programs..   Cardiac Rehab from 07/12/2018 in [facility] Cardiac and Pulmonary Rehab  Date  06/09/18  Educator  [facility]   Instruction Review Code  1- Verbalizes Understanding      Flexibility, Balance, Mind/Body Relaxation: Provides group verbal/written instruction on the benefits of flexibility and balance training, including mind/body exercise modes such as yoga, pilates and tai chi.  Demonstration and skill practice provided.   Cardiac Rehab from 07/12/2018 in [facility] Cardiac and Pulmonary Rehab  Date  06/14/18  Educator  AS  Instruction Review Code  1- Verbalizes Understanding      Stress and Anxiety: - Provides group verbal and written instruction about the health risks of elevated stress and causes of high stress.  Discuss the correlation between heart/lung disease and anxiety and treatment options. Review healthy ways to manage with stress and anxiety.   Cardiac Rehab from 07/12/2018 in [facility] Cardiac and Pulmonary Rehab  Date  06/21/18  Educator  kc  Instruction Review Code  1- Verbalizes Understanding      Depression: - Provides group verbal and written instruction on the correlation between heart/lung disease and depressed mood, treatment options, and the stigmas associated with seeking treatment.   Cardiac Rehab from 07/12/2018 in [facility] Cardiac and Pulmonary Rehab  Date  06/07/18  Educator  [facility]  Instruction Review Code  1- Verbalizes Understanding      Anatomy & Physiology of the Heart: - Group verbal and written instruction and models provide basic cardiac anatomy and physiology, with the coronary electrical and arterial systems. Review of Valvular disease and Heart Failure   Cardiac Rehab from 07/12/2018 in [facility] Cardiac and Pulmonary Rehab  Date  06/23/18  Educator  CE  Instruction Review Code  1- Verbalizes Understanding      Cardiac Procedures: - Group verbal and written instruction to review commonly prescribed medications for heart disease. Reviews the medication, class of the drug, and side effects. Includes the steps to properly store meds and maintain the prescription regimen. (beta  blockers and nitrates)   Cardiac Rehab from 07/12/2018 in [facility] Cardiac and Pulmonary Rehab  Date  07/05/18  Educator  KS  Instruction Review Code  1- Verbalizes Understanding      Cardiac Medications I: - Group verbal and written instruction to review commonly prescribed medications for heart disease. Reviews the medication, class of the drug, and side effects. Includes the steps to properly store meds and maintain the prescription regimen.   Cardiac Rehab from 07/12/2018 in [facility] Cardiac and Pulmonary Rehab  Date  06/28/18  Educator  SB  Instruction Review Code  1- Verbalizes Understanding      Cardiac Medications II: -Group verbal and written instruction to review commonly prescribed medications for heart disease. Reviews the medication, class of the drug, and side effects. (all other drug classes)   Cardiac Rehab from 07/12/2018 in [facility] Cardiac and Pulmonary Rehab  Date  06/16/18  Educator  CE  Instruction Review Code  1- Verbalizes Understanding       Go Sex-Intimacy & Heart Disease, Get SMART - Goal Setting: - Group verbal and written instruction through game format to discuss heart disease and the return to sexual intimacy. Provides group verbal and written material to discuss and apply goal setting through the application of the S.M.A.R.T. Method.   Cardiac Rehab from 07/12/2018 in [facility] Cardiac and Pulmonary Rehab  Date  07/05/18  Educator  KS  Instruction Review Code  1- Verbalizes Understanding      Other Matters of the Heart: - Provides group verbal, written materials and models to describe Stable Angina and Peripheral Artery. Includes description of the disease process and treatment options available to the cardiac patient.   Cardiac Rehab from 07/12/2018 in [facility] Cardiac and Pulmonary Rehab  Date  06/23/18  Educator  CE  Instruction Review Code  1- Verbalizes Understanding      Exercise & Equipment Safety: - Individual verbal instruction and demonstration of  equipment use and safety with use of the equipment.   Cardiac Rehab from 07/12/2018 in [facility] Cardiac and Pulmonary Rehab  Date  05/26/18  Educator  [facility]  Instruction Review Code  1- Verbalizes Understanding      Infection Prevention: - Provides verbal and written material to individual with discussion of infection control including proper hand washing and proper equipment cleaning during exercise session.   Cardiac Rehab from 07/12/2018 in [facility] Cardiac and Pulmonary Rehab  Date  05/26/18  Educator  [facility]  Instruction Review Code  1- Verbalizes Understanding      Falls Prevention: - Provides verbal and written material to individual with discussion of falls prevention and safety.   Cardiac Rehab from 07/12/2018 in [facility] Cardiac and Pulmonary Rehab  Date  05/26/18  Educator  [facility]  Instruction Review Code  1- Verbalizes Understanding      Diabetes: - Individual verbal and written instruction to review signs/symptoms of diabetes, desired ranges of glucose level fasting, after meals and with exercise. Acknowledge that pre and post exercise glucose checks will be done for 3 sessions at entry of program.   Know Your Numbers and Risk Factors: -Group verbal and written instruction about important numbers in your health.  Discussion of what are risk factors and how they play a role in the disease process.  Review of Cholesterol, Blood Pressure, Diabetes, and BMI and the role they play in your overall health.   Cardiac Rehab from 07/12/2018 in [facility] Cardiac and Pulmonary Rehab  Date  06/16/18  Educator  CE  Instruction Review Code  1- Verbalizes Understanding      Sleep Hygiene: -Provides group verbal and written instruction about how sleep can affect your health.  Define sleep hygiene, discuss sleep cycles and impact of sleep habits. Review good sleep hygiene tips.    Other: -Provides group and verbal instruction on various topics (see comments)   Knowledge Questionnaire Score: Knowledge  Questionnaire Score - 07/12/18 1110      Knowledge Questionnaire Score   Pre Score  17/26    Post Score  21/26   reviewed with pt today      Core Components/Risk Factors/Patient Goals at Admission: Personal Goals and Risk Factors at Admission - 06/28/18 1039      Core Components/Risk Factors/Patient Goals on Admission   Intervention  Monitor prescription use compliance.       Core Components/Risk Factors/Patient Goals Review:  Goals and Risk Factor Review  Justin [name] Name 06/28/18 1041             Core Components/Risk Factors/Patient Goals Review   Personal Goals Review  Weight Management/Obesity;Lipids;Hypertension       Review  Justin [name] has lost weight down to 190lbs. He attributes this to nutrition changes.  No bloodwork recently for lipids. He continues to take medications. He has not been taking his blood pressure at home but will start. He does have a blood pressure cuff.        Expected Outcomes  Short: Justin [name] is going to start taking blood pressure at home. Long: Continue taking meds as prescribed, check bp at home, walk outside of class, and try to find ways to relax when stressed.          Core Components/Risk Factors/Patient Goals at Discharge (Final Review):  Goals and Risk Factor Review - 06/28/18 1041      Core Components/Risk Factors/Patient Goals Review   Personal Goals Review  Weight Management/Obesity;Lipids;Hypertension    Review  Justin [name] has lost weight down to 190lbs. He attributes this to nutrition changes.  No bloodwork recently for lipids. He continues to take medications. He has not been taking his blood pressure at home but will start. He does have a blood pressure cuff.     Expected Outcomes  Short: Justin [name] is going to start taking blood pressure at home. Long: Continue taking meds as prescribed, check bp at home, walk outside of class, and try to find ways to relax when stressed.       ITP Comments: ITP Comments    Row Name 05/26/18 1251 06/15/18 0643  07/13/18 0607       ITP Comments  Med Review completed. Initial ITP created. Diagnosis can be found in [facility] 9/7  30 day review.  Continue with ITP unless directed changes per Medical Director review.  30 day review. Continue with ITP unless direccted changes per Medical Director Chart Review.        Comments:

## 2018-07-14 VITALS — Ht 67.0 in | Wt 190.0 lb

## 2018-07-14 DIAGNOSIS — Z7902 Long term (current) use of antithrombotics/antiplatelets: Secondary | ICD-10-CM | POA: Diagnosis not present

## 2018-07-14 DIAGNOSIS — Z955 Presence of coronary angioplasty implant and graft: Secondary | ICD-10-CM

## 2018-07-14 DIAGNOSIS — I213 ST elevation (STEMI) myocardial infarction of unspecified site: Secondary | ICD-10-CM

## 2018-07-14 DIAGNOSIS — Z79899 Other long term (current) drug therapy: Secondary | ICD-10-CM | POA: Diagnosis not present

## 2018-07-14 DIAGNOSIS — N400 Enlarged prostate without lower urinary tract symptoms: Secondary | ICD-10-CM | POA: Diagnosis not present

## 2018-07-14 DIAGNOSIS — Z7982 Long term (current) use of aspirin: Secondary | ICD-10-CM | POA: Diagnosis not present

## 2018-07-14 NOTE — Progress Notes (Signed)
Daily Session Note  Patient Details  Name: Justin [name] MRN: [id number] Date of Birth: Mar 21, 1941 Referring Provider:     Cardiac Rehab from 05/26/2018 in [facility] Cardiac and Pulmonary Rehab  Referring Provider  Paraschos      Encounter Date: 07/14/2018  Check In: Session Check In - 07/14/18 1001      Check-In   Supervising physician immediately available to respond to emergencies  See telemetry face sheet for immediately available ER MD    Location  [facility]-Cardiac & Pulmonary Rehab    Staff Present  Justin Mend Lorre Nick, MA, RCEP, CCRP, Exercise Physiologist;Carroll Enterkin, RN, BSN    Medication changes reported      No    Fall or balance concerns reported     No    Tobacco Cessation  No Change    Warm-up and Cool-down  Performed on first and last piece of equipment    Resistance Training Performed  Yes    VAD Patient?  No    PAD/SET Patient?  No      Pain Assessment   Currently in Pain?  No/denies    Multiple Pain Sites  No          Social History   Tobacco Use  Smoking Status Former Smoker  . Packs/day: 2.00  . Types: Cigarettes  . Last attempt to quit: 01/17/1979  . Years since quitting: 39.5  Smokeless Tobacco Never Used    Goals Met:  Independence with exercise equipment Exercise tolerated well No report of cardiac concerns or symptoms Strength training completed today  Goals Unmet:  Not Applicable  Comments: Pt able to follow exercise prescription today without complaint.  Will continue to monitor for progression.  [place] Name 05/26/18 1243 07/14/18 1004       6 Minute Walk   Phase  Initial  Discharge    Distance  1045 feet  1190 feet    Distance % Change  -  13.8 %    Distance Feet Change  -  145 ft    Walk Time  6 minutes  6 minutes    # of Rest Breaks  0  0    MPH  1.98  2.25    METS  1.94  2.19    RPE  11  13    Perceived Dyspnea   0  0    VO2 Peak  6.78  7.69    Symptoms  No  No    Resting HR  61 bpm   67 bpm    Resting BP  104/56  110/62    Resting Oxygen Saturation   96 %  -    Exercise Oxygen Saturation  during 6 min walk  97 %  -    Max Ex. HR  103 bpm  102 bpm    Max Ex. BP  134/60  136/68    2 Minute Post BP  100/58  -       Dr. Emily Filbert is Medical Director for [place] and [facility].

## 2018-07-19 DIAGNOSIS — Z7902 Long term (current) use of antithrombotics/antiplatelets: Secondary | ICD-10-CM | POA: Diagnosis not present

## 2018-07-19 DIAGNOSIS — Z955 Presence of coronary angioplasty implant and graft: Secondary | ICD-10-CM

## 2018-07-19 DIAGNOSIS — I213 ST elevation (STEMI) myocardial infarction of unspecified site: Secondary | ICD-10-CM

## 2018-07-19 DIAGNOSIS — Z79899 Other long term (current) drug therapy: Secondary | ICD-10-CM | POA: Diagnosis not present

## 2018-07-19 DIAGNOSIS — N400 Enlarged prostate without lower urinary tract symptoms: Secondary | ICD-10-CM | POA: Diagnosis not present

## 2018-07-19 DIAGNOSIS — Z7982 Long term (current) use of aspirin: Secondary | ICD-10-CM | POA: Diagnosis not present

## 2018-07-19 NOTE — Progress Notes (Signed)
Daily Session Note  Patient Details  Name: Justin [name] MRN: [id number] Date of Birth: [birth year] Referring Provider:     Cardiac Rehab from 05/26/2018 in [facility] Cardiac and Pulmonary Rehab  Referring Provider  Paraschos      Encounter Date: 07/19/2018  Check In: Session Check In - 07/19/18 0942      Check-In   Supervising physician immediately available to respond to emergencies  See telemetry face sheet for immediately available ER MD    Location  [facility]-Cardiac & Pulmonary Rehab    Staff Present  Alberteen Sam, MA, RCEP, CCRP, Exercise Physiologist;Joseph [place];Heath Lark, RN, BSN, CCRP    Medication changes reported      No    Fall or balance concerns reported     No    Tobacco Cessation  No Change    Warm-up and Cool-down  Performed on first and last piece of equipment    Resistance Training Performed  Yes    VAD Patient?  No    PAD/SET Patient?  No      Pain Assessment   Currently in Pain?  No/denies          Social History   Tobacco Use  Smoking Status Former Smoker  . Packs/day: 2.00  . Types: Cigarettes  . Last attempt to quit: 01/17/1979  . Years since quitting: 39.5  Smokeless Tobacco Never Used    Goals Met:  Independence with exercise equipment Exercise tolerated well No report of cardiac concerns or symptoms Strength training completed today  Goals Unmet:  Not Applicable  Comments: Pt able to follow exercise prescription today without complaint.  Will continue to monitor for progression.   Dr. Emily Filbert is Medical Director for [place] and [facility].

## 2018-07-21 ENCOUNTER — Encounter: Payer: Medicare Other | Admitting: *Deleted

## 2018-07-21 DIAGNOSIS — Z7982 Long term (current) use of aspirin: Secondary | ICD-10-CM | POA: Diagnosis not present

## 2018-07-21 DIAGNOSIS — Z955 Presence of coronary angioplasty implant and graft: Secondary | ICD-10-CM | POA: Diagnosis not present

## 2018-07-21 DIAGNOSIS — N400 Enlarged prostate without lower urinary tract symptoms: Secondary | ICD-10-CM | POA: Diagnosis not present

## 2018-07-21 DIAGNOSIS — Z79899 Other long term (current) drug therapy: Secondary | ICD-10-CM | POA: Diagnosis not present

## 2018-07-21 DIAGNOSIS — I213 ST elevation (STEMI) myocardial infarction of unspecified site: Secondary | ICD-10-CM

## 2018-07-21 DIAGNOSIS — Z7902 Long term (current) use of antithrombotics/antiplatelets: Secondary | ICD-10-CM | POA: Diagnosis not present

## 2018-07-21 NOTE — Progress Notes (Signed)
Daily Session Note  Patient Details  Name: Justin [name] MRN: [id number] Date of Birth: 25-Mar-1941 Referring Provider:     Cardiac Rehab from 05/26/2018 in [facility] Cardiac and Pulmonary Rehab  Referring Provider  Paraschos      Encounter Date: 07/21/2018  Check In: Session Check In - 07/21/18 0928      Check-In   Supervising physician immediately available to respond to emergencies  See telemetry face sheet for immediately available ER MD    Location  [facility]-Cardiac & Pulmonary Rehab    Staff Present  Gerlene Burdock, RN, BSN;Braeden Kennan Luan Pulling, MA, RCEP, CCRP, Exercise Physiologist;Joseph Tessie Fass RCP,RRT,BSRT    Medication changes reported      No    Fall or balance concerns reported     No    Warm-up and Cool-down  Performed on first and last piece of equipment    Resistance Training Performed  Yes    VAD Patient?  No    PAD/SET Patient?  No      Pain Assessment   Currently in Pain?  No/denies          Social History   Tobacco Use  Smoking Status Former Smoker  . Packs/day: 2.00  . Types: Cigarettes  . Last attempt to quit: 01/17/1979  . Years since quitting: 39.5  Smokeless Tobacco Never Used    Goals Met:  Independence with exercise equipment Exercise tolerated well No report of cardiac concerns or symptoms Strength training completed today  Goals Unmet:  Not Applicable  Comments: Pt able to follow exercise prescription today without complaint.  Will continue to monitor for progression.    Dr. Emily Filbert is Medical Director for [place] and [facility].

## 2018-07-21 NOTE — Patient Instructions (Signed)
Discharge Patient Instructions  Patient Details  Name: Justin [name] MRN: [id number] Date of Birth: 1941/01/10 Referring Provider:  Isaias Cowman, MD   Number of Visits: 64  Reason for Discharge:  Patient reached a stable level of exercise. Patient independent in their exercise. Patient has met program and personal goals.  Smoking History:  Social History   Tobacco Use  Smoking Status Former Smoker  . Packs/day: 2.00  . Types: Cigarettes  . Last attempt to quit: 01/17/1979  . Years since quitting: 39.5  Smokeless Tobacco Never Used    Diagnosis:  ST elevation myocardial infarction (STEMI), unspecified artery (HCC)  S/P coronary artery stent placement  Initial Exercise Prescription: Initial Exercise Prescription - 05/26/18 1200      Date of Initial Exercise RX and Referring Provider   Date  05/26/18    Referring Provider  Justin [name]      Treadmill   MPH  1.3    Grade  0    Minutes  15    METs  2      NuStep   Level  2    SPM  80    Minutes  15    METs  2      REL-XR   Level  2    Speed  50    Minutes  15    METs  2      Prescription Details   Frequency (times per week)  3    Duration  Progress to 45 minutes of aerobic exercise without signs/symptoms of physical distress      Intensity   THRR 40-80% of Max Heartrate  94-126    Ratings of Perceived Exertion  11-13    Perceived Dyspnea  0-4      Resistance Training   Training Prescription  Yes    Weight  3 lb    Reps  10-15       Discharge Exercise Prescription (Final Exercise Prescription Changes): Exercise Prescription Changes - 07/19/18 1600      Response to Exercise   Blood Pressure (Admit)  126/70    Blood Pressure (Exercise)  148/70    Blood Pressure (Exit)  98/60    Heart Rate (Admit)  62 bpm    Heart Rate (Exercise)  91 bpm    Heart Rate (Exit)  95 bpm    Rating of Perceived Exertion (Exercise)  13    Symptoms  none    Duration  Continue with 30 min of aerobic exercise  without signs/symptoms of physical distress.    Intensity  THRR unchanged      Progression   Progression  Continue to progress workloads to maintain intensity without signs/symptoms of physical distress.    Average METs  2.72      Resistance Training   Training Prescription  Yes    Weight  3 lbs    Reps  10-15      Interval Training   Interval Training  No      Treadmill   MPH  1.3    Grade  1    Minutes  15    METs  2.17      NuStep   Level  3    Minutes  15    METs  2.9      REL-XR   Level  5    Minutes  15    METs  3.1      Home Exercise Plan   Plans to continue exercise at  Home (comment)   [place]   Frequency  Add 2 additional days to program exercise sessions.    Initial Home Exercises Provided  06/14/18       Functional Capacity: 6 Minute Walk    Row Name 05/26/18 1243 07/14/18 1004       6 Minute Walk   Phase  Initial  Discharge    Distance  1045 feet  1190 feet    Distance % Change  -  13.8 %    Distance Feet Change  -  145 ft    Walk Time  6 minutes  6 minutes    # of Rest Breaks  0  0    MPH  1.98  2.25    METS  1.94  2.19    RPE  11  13    Perceived Dyspnea   0  0    VO2 Peak  6.78  7.69    Symptoms  No  No    Resting HR  61 bpm  67 bpm    Resting BP  104/56  110/62    Resting Oxygen Saturation   96 %  -    Exercise Oxygen Saturation  during 6 min walk  97 %  -    Max Ex. HR  103 bpm  102 bpm    Max Ex. BP  134/60  136/68    2 Minute Post BP  100/58  -       Quality of Life: Quality of Life - 07/12/18 1113      Quality of Life   Select  Quality of Life      Quality of Life Scores   Health/Function Pre  14.96 %    Health/Function Post  13 %    Health/Function % Change  -13.1 %    Socioeconomic Pre  12.6 %    Socioeconomic Post  11.33 %    Socioeconomic % Change   -10.08 %    Psych/Spiritual Pre  11 %    Psych/Spiritual Post  13.71 %    Psych/Spiritual % Change  24.64 %    Family Pre  14.6 %    Family Post   15.4 %    Family % Change  5.48 %    GLOBAL Pre  14.13 %    GLOBAL Post  13.21 %    GLOBAL % Change  -6.51 %       Personal Goals: Goals established at orientation with interventions provided to work toward goal. Personal Goals and Risk Factors at Admission - 06/28/18 1039      Core Components/Risk Factors/Patient Goals on Admission   Intervention  Monitor prescription use compliance.        Personal Goals Discharge: Goals and Risk Factor Review - 07/19/18 1022      Core Components/Risk Factors/Patient Goals Review   Personal Goals Review  Other;Weight Management/Obesity;Hypertension;Stress;Lipids    Review  Justin [name] is taking all meds as directed.  He hasn't stretd taking BP at home yet.  We discussed checking it at least once per day at the same time.  He states he would like to move and needs more income to do so.  The stress from where he lives is dissipating as time goes on.  Staff gave info on [employer] and [place] as options to continue to exercise. He also expressed he doesn't sleep well.  Staff recommended he talk with his Dr about sleep concerns.    Expected  Outcomes  Short - take BP at home, follow up about joining [facility] Long - maintain exercise on his own       Exercise Goals and Review: Exercise Goals    Row Name 05/26/18 1242             Exercise Goals   Increase Physical Activity  Yes       Intervention  Provide advice, education, support and counseling about physical activity/exercise needs.;Develop an individualized exercise prescription for aerobic and resistive training based on initial evaluation findings, risk stratification, comorbidities and participant's personal goals.       Expected Outcomes  Short Term: Attend rehab on a regular basis to increase amount of physical activity.;Long Term: Add in home exercise to make exercise part of routine and to increase amount of physical activity.;Long Term: Exercising regularly at least 3-5 days a week.       Increase  Strength and Stamina  Yes       Intervention  Provide advice, education, support and counseling about physical activity/exercise needs.;Develop an individualized exercise prescription for aerobic and resistive training based on initial evaluation findings, risk stratification, comorbidities and participant's personal goals.       Expected Outcomes  Short Term: Increase workloads from initial exercise prescription for resistance, speed, and METs.;Short Term: Perform resistance training exercises routinely during rehab and add in resistance training at home;Long Term: Improve cardiorespiratory fitness, muscular endurance and strength as measured by increased METs and functional capacity (6MWT)       Able to understand and use rate of perceived exertion (RPE) scale  Yes       Intervention  Provide education and explanation on how to use RPE scale       Expected Outcomes  Short Term: Able to use RPE daily in rehab to express subjective intensity level;Long Term:  Able to use RPE to guide intensity level when exercising independently       Able to understand and use Dyspnea scale  Yes       Intervention  Provide education and explanation on how to use Dyspnea scale       Expected Outcomes  Short Term: Able to use Dyspnea scale daily in rehab to express subjective sense of shortness of breath during exertion;Long Term: Able to use Dyspnea scale to guide intensity level when exercising independently       Knowledge and understanding of Target Heart Rate Range (THRR)  Yes       Intervention  Provide education and explanation of THRR including how the numbers were predicted and where they are located for reference       Expected Outcomes  Short Term: Able to state/look up THRR;Short Term: Able to use daily as guideline for intensity in rehab;Long Term: Able to use THRR to govern intensity when exercising independently       Able to check pulse independently  Yes       Intervention  Provide education and  demonstration on how to check pulse in carotid and radial arteries.;Review the importance of being able to check your own pulse for safety during independent exercise       Expected Outcomes  Short Term: Able to explain why pulse checking is important during independent exercise;Long Term: Able to check pulse independently and accurately       Understanding of Exercise Prescription  Yes       Intervention  Provide education, explanation, and written materials on patient's individual exercise prescription  Expected Outcomes  Short Term: Able to explain program exercise prescription;Long Term: Able to explain home exercise prescription to exercise independently          Exercise Goals Re-Evaluation: Exercise Goals Re-Evaluation    Row Name 05/30/18 0804 06/06/18 1450 06/14/18 1043 06/21/18 1148 06/28/18 1018     Exercise Goal Re-Evaluation   Exercise Goals Review  Increase Physical Activity;Increase Strength and Stamina;Able to understand and use rate of perceived exertion (RPE) scale;Knowledge and understanding of Target Heart Rate Range (THRR);Understanding of Exercise Prescription  Increase Physical Activity;Increase Strength and Stamina;Understanding of Exercise Prescription  Increase Physical Activity;Able to understand and use rate of perceived exertion (RPE) scale;Knowledge and understanding of Target Heart Rate Range (THRR);Increase Strength and Stamina  Increase Physical Activity;Increase Strength and Stamina;Understanding of Exercise Prescription  Increase Physical Activity;Increase Strength and Stamina;Understanding of Exercise Prescription   Comments  Reviewed RPE scale, THR and program prescription with pt today.  Pt voiced understanding and was given a copy of goals to take home.   Izeyah is off to a good start in rehab.  He is switching to the 9am class as he is struggling to get up early enough.  We will continue to monitor his progress.   Reviewed home exercise with pt today.  Pt  plans to walk for exercise.  Reviewed THR, pulse, RPE, sign and symptoms, NTG use, and when to call 911 or MD.  Also discussed weather considerations and indoor options.  Pt voiced understanding.  Cane is doing well in rehab.  He is now up to level 5 on the XR and level 3 on the NuStep.  We will try to start to increase his treadmill and continue to monitor his progression.   Jaelin is doing well in rehab. He enjoys the XR machine most. He was scared to walk on the treadmill and has overcome that fear. We will remind him to try to relax on the treadmill. We will continue to monitor his progression.   Expected Outcomes  Short: Use RPE daily to regulate intensity. Long: Follow program prescription in THR.  Short: Continue to attend new class regularly.  Long: Continue to increase strength and stamina.   Short - walk at least twice per week outside program sessions Long - maintain exercise on his own  Short: Increase treadmill.  Long: Continue to increase walking at home.   Short: Increase treadmill. Long: Continue to increase walking at home. Living situation interferes with walking more but he will try.   Miller Name 06/30/18 1025 07/05/18 1549 07/19/18 1635         Exercise Goal Re-Evaluation   Exercise Goals Review  Increase Physical Activity;Increase Strength and Stamina;Understanding of Exercise Prescription  Increase Physical Activity;Increase Strength and Stamina;Understanding of Exercise Prescription  Increase Physical Activity;Increase Strength and Stamina;Understanding of Exercise Prescription     Comments  Donell is walking around a quarter of a mile track for 20 minutes on the days he is not here. Kayvon will steadily increase to 30 minutes. He will go to the [facility] in [place] when the weather does not permit his walking outside.   Brandis continues to do well in rehab.  He is now up to level 3 on the NuStep and added a 1% grade on the treadmill.  We will continue to monitor his progression.    Roan will be graduating next Tuesday!!  He is planning to continue to exercise by walking at home and going to [facility].  He  is toying with idea of [employer] as he is not sure if he will go consisently on his own.      Expected Outcomes  Short: Increase walk time to 30 minutes 3 additional days per week. Long: Exercise independently and continue to increase and vary exercises at the [facility] [place] (bike, treadmill, stepper).  Short: Increase speed on treadmill.  Long: Continue to increase strength and stamina.   Short: Graduate!!  Long: Continue to exercise independently.         Nutrition & Weight - Outcomes: Pre Biometrics - 05/26/18 1241      Pre Biometrics   Height  5' 7"  (1.702 m)    Weight  195 lb 12.8 oz (88.8 kg)    Waist Circumference  40.5 inches    Hip Circumference  43 inches    Waist to Hip Ratio  0.94 %    BMI (Calculated)  30.66      Post Biometrics - 07/14/18 1006       Post  Biometrics   Height  5' 7"  (1.702 m)    Weight  190 lb (86.2 kg)    Waist Circumference  40 inches    Hip Circumference  42 inches    Waist to Hip Ratio  0.95 %    BMI (Calculated)  29.75    Single Leg Stand  3.02 seconds       Nutrition: Nutrition Therapy & Goals - 06/01/18 1449      Nutrition Therapy   Diet  TLC    Drug/Food Interactions  Statins/Certain Fruits    Protein (specify units)  12oz    Fiber  30 grams    Whole Grain Foods  3 servings   eats whole grains regularly   Saturated Fats  16 max. grams    Fruits and Vegetables  6 servings/day   8 ideal; eats multiple servings of fruits and vegetables daily   Sodium  1500 grams      Personal Nutrition Goals   Nutrition Goal  Continue to reduce intake of processed snack foods and added sugars, and to limit sodium intake, great job    Personal Goal #2  Work with your housemates to [employer] a wider variety of vegetables each week    Comments  He and his wife live with another couple who are not as health  conscious, and the families share cooking responsibilites, so eating a diet they prefer is sometimes challenging. They choose whole grain products, eat a variety of fruits/ vegetables, and choose lower sugar dessert items. They recently purchased a dehydrator and are in the process of making their own dried fruit and trail mix without added sugar. They choose SF beverages and eat at home 99% of the time. They choose low sodium options when they are able      Intervention Plan   Intervention  Prescribe, educate and counsel regarding individualized specific dietary modifications aiming towards targeted core components such as weight, hypertension, lipid management, diabetes, heart failure and other comorbidities.;Nutrition handout(s) given to patient.   no-salt seasoning blends handout; low sodium nutrition therapy handout   Expected Outcomes  Short Term Goal: Understand basic principles of dietary content, such as calories, fat, sodium, cholesterol and nutrients.;Short Term Goal: A plan has been developed with personal nutrition goals set during dietitian appointment.;Long Term Goal: Adherence to prescribed nutrition plan.       Nutrition Discharge: Nutrition Assessments - 07/12/18 1110      MEDFICTS Scores  Pre Score  56    Post Score  --   declined nutrition survery      Education Questionnaire Score: Knowledge Questionnaire Score - 07/12/18 1110      Knowledge Questionnaire Score   Pre Score  17/26    Post Score  21/26   reviewed with pt today      Goals reviewed with patient; copy given to patient.

## 2018-07-26 ENCOUNTER — Encounter: Payer: Medicare Other | Admitting: *Deleted

## 2018-07-26 DIAGNOSIS — Z955 Presence of coronary angioplasty implant and graft: Secondary | ICD-10-CM

## 2018-07-26 DIAGNOSIS — N400 Enlarged prostate without lower urinary tract symptoms: Secondary | ICD-10-CM | POA: Diagnosis not present

## 2018-07-26 DIAGNOSIS — I213 ST elevation (STEMI) myocardial infarction of unspecified site: Secondary | ICD-10-CM

## 2018-07-26 DIAGNOSIS — Z79899 Other long term (current) drug therapy: Secondary | ICD-10-CM | POA: Diagnosis not present

## 2018-07-26 DIAGNOSIS — Z7982 Long term (current) use of aspirin: Secondary | ICD-10-CM | POA: Diagnosis not present

## 2018-07-26 DIAGNOSIS — Z7902 Long term (current) use of antithrombotics/antiplatelets: Secondary | ICD-10-CM | POA: Diagnosis not present

## 2018-07-26 NOTE — Progress Notes (Signed)
Daily Session Note  Patient Details  Name: Justin [name] MRN: [id number] Date of Birth: 21-Nov-1940 Referring Provider:     Cardiac Rehab from 05/26/2018 in [facility]  Referring Provider  Paraschos      Encounter Date: 07/26/2018  Check In: Session Check In - 07/26/18 1151      Check-In   Supervising physician immediately available to respond to emergencies  See telemetry face sheet for immediately available ER MD    Location  [facility]    Staff Present  Justin [name] BS, Exercise Physiologist;Justin Bice, RN, BSN, CCRP;Justin Chismar [place], MA, RCEP, CCRP, Exercise Physiologist    Medication changes reported      No    Fall or balance concerns reported     No    Warm-up and Cool-down  Performed on first and last piece of equipment    Resistance Training Performed  Yes    VAD Patient?  No    PAD/SET Patient?  No      Pain Assessment   Currently in Pain?  No/denies          Social History   Tobacco Use  Smoking Status Former Smoker  . Packs/day: 2.00  . Types: Cigarettes  . Last attempt to quit: 01/17/1979  . Years since quitting: 39.5  Smokeless Tobacco Never Used    Goals Met:  Independence with exercise equipment Exercise tolerated well Personal goals reviewed No report of cardiac concerns or symptoms Strength training completed today  Goals Unmet:  Not Applicable  Comments: Justin [name] graduated today from  rehab with 36 sessions completed.  Details of the patient's exercise prescription and what He needs to do in order to continue the prescription and progress were discussed with patient.  Patient was given a copy of prescription and goals.  Patient verbalized understanding.  Justin [name] plans to continue to exercise by walking at home.  Dr. Emily Filbert is Medical Director for [facility].

## 2018-07-26 NOTE — Progress Notes (Signed)
Cardiac Individual Treatment Plan  Patient Details  Name: Justin [name] MRN: [id number] Date of Birth: 09-Aug-1941 Referring Provider:     Cardiac Rehab from 05/26/2018 in [facility] Cardiac and Pulmonary Rehab  Referring Provider  Paraschos      Initial Encounter Date:    Cardiac Rehab from 05/26/2018 in [facility] Cardiac and Pulmonary Rehab  Date  05/26/18      Visit Diagnosis: ST elevation myocardial infarction (STEMI), unspecified artery ([place])  S/P coronary artery stent placement  Patient's Home Medications on Admission:  Current Outpatient Medications:  .  aspirin 81 MG chewable tablet, Chew 1 tablet (81 mg total) by mouth daily., Disp: , Rfl:  .  atorvastatin (LIPITOR) 80 MG tablet, Take 1 tablet (80 mg total) by mouth daily at 6 PM., Disp: 30 tablet, Rfl: 0 .  Cranberry-Vitamin C-Vitamin E (TH CRANBERRY CONCENTRATE PO), Take 2 tablets by mouth daily. , Disp: , Rfl:  .  finasteride (PROSCAR) 5 MG tablet, Take 1 tablet (5 mg total) by mouth daily. pm (Patient not taking: Reported on 05/26/2018), Disp: 30 tablet, Rfl: 3 .  lisinopril (PRINIVIL,ZESTRIL) 20 MG tablet, Take 20 mg by mouth daily. , Disp: , Rfl:  .  metoprolol tartrate (LOPRESSOR) 25 MG tablet, Take 1 tablet (25 mg total) by mouth 2 (two) times daily., Disp: 60 tablet, Rfl: 0 .  OVER THE COUNTER MEDICATION, Take 1 tablet by mouth 2 (two) times daily. Prosta-Rye Plus, Disp: , Rfl:  .  tamsulosin (FLOMAX) 0.4 MG CAPS capsule, Take 1 capsule (0.4 mg total) by mouth daily., Disp: 30 capsule, Rfl: 3 .  ticagrelor (BRILINTA) 90 MG TABS tablet, Take 1 tablet (90 mg total) by mouth 2 (two) times daily., Disp: 60 tablet, Rfl: 0  Past Medical History: Past Medical History:  Diagnosis Date  . Arthritis   . Bell's palsy   . Bell's palsy   . BPH (benign prostatic hyperplasia)   . Cancer ([place])    bladder  . Deaf    right ear  . Dizziness   . History of urinary urgency   . Hydronephrosis, bilateral   . Hypertension   . Kidney stone    . Neuroma    acoustic  . 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 Urinary frequency   . Urinary tract infection     Tobacco Use: Social History   Tobacco Use  Smoking Status Former Smoker  . Packs/day: 2.00  . Types: Cigarettes  . Last attempt to quit: 01/17/1979  . Years since quitting: 39.5  Smokeless Tobacco Never Used    Labs: Recent Chemical engineer    Labs for ITP Cardiac and Pulmonary Rehab Latest Ref Rng & Units 05/15/2018   Hemoglobin A1c 4.8 - 5.6 % 6.2(H)       Exercise Target Goals: Exercise Program Goal: Individual exercise prescription set using results from initial 6 min walk test and THRR while considering  patient's activity barriers and safety.   Exercise Prescription Goal: Initial exercise prescription builds to 30-45 minutes a day of aerobic activity, 2-3 days per week.  Home exercise guidelines will be given to patient during program as part of exercise prescription that the participant will acknowledge.  Activity Barriers & Risk Stratification: Activity Barriers & Cardiac Risk Stratification - 05/26/18 1324      Activity Barriers & Cardiac Risk Stratification   Activity Barriers  Balance Concerns    Cardiac Risk Stratification  Moderate       6 Minute  Walk: 6 Minute Walk    Row Name 05/26/18 1243 07/14/18 1004       6 Minute Walk   Phase  Initial  Discharge    Distance  1045 feet  1190 feet    Distance % Change  -  13.8 %    Distance Feet Change  -  145 ft    Walk Time  6 minutes  6 minutes    # of Rest Breaks  0  0    MPH  1.98  2.25    METS  1.94  2.19    RPE  11  13    Perceived Dyspnea   0  0    VO2 Peak  6.78  7.69    Symptoms  No  No    Resting HR  61 bpm  67 bpm    Resting BP  104/56  110/62    Resting Oxygen Saturation   96 %  -    Exercise Oxygen Saturation  during 6 min walk  97 %  -    Max Ex. HR  103 bpm  102 bpm    Max Ex. BP  134/60  136/68    2 Minute Post BP  100/58  -       Oxygen Initial  Assessment:   Oxygen Re-Evaluation:   Oxygen Discharge (Final Oxygen Re-Evaluation):   Initial Exercise Prescription: Initial Exercise Prescription - 05/26/18 1200      Date of Initial Exercise RX and Referring Provider   Date  05/26/18    Referring Provider  Paraschos      Treadmill   MPH  1.3    Grade  0    Minutes  15    METs  2      NuStep   Level  2    SPM  80    Minutes  15    METs  2      REL-XR   Level  2    Speed  50    Minutes  15    METs  2      Prescription Details   Frequency (times per week)  3    Duration  Progress to 45 minutes of aerobic exercise without signs/symptoms of physical distress      Intensity   THRR 40-80% of Max Heartrate  94-126    Ratings of Perceived Exertion  11-13    Perceived Dyspnea  0-4      Resistance Training   Training Prescription  Yes    Weight  3 lb    Reps  10-15       Perform Capillary Blood Glucose checks as needed.  Exercise Prescription Changes: Exercise Prescription Changes    Row Name 05/26/18 1200 06/06/18 1500 06/14/18 1000 06/21/18 1100 07/05/18 1500     Response to Exercise   Blood Pressure (Admit)  104/56  112/66  -  112/64  124/64   Blood Pressure (Exercise)  134/60  134/62  -  140/80  140/62   Blood Pressure (Exit)  100/58  104/56  -  118/60  100/58   Heart Rate (Admit)  67 bpm  80 bpm  -  85 bpm  66 bpm   Heart Rate (Exercise)  103 bpm  100 bpm  -  106 bpm  86 bpm   Heart Rate (Exit)  59 bpm  65 bpm  -  84 bpm  63 bpm   Oxygen Saturation (Admit)  96 %  -  -  -  -  Oxygen Saturation (Exercise)  97 %  -  -  -  -   Oxygen Saturation (Exit)  98 %  -  -  -  -   Rating of Perceived Exertion (Exercise)  11  17  -  15  13   Symptoms  -  fatigue  -  none  none   Duration  -  Continue with 30 min of aerobic exercise without signs/symptoms of physical distress.  -  Continue with 30 min of aerobic exercise without signs/symptoms of physical distress.  Continue with 30 min of aerobic exercise without  signs/symptoms of physical distress.   Intensity  -  THRR unchanged  -  THRR unchanged  THRR unchanged     Progression   Progression  -  Continue to progress workloads to maintain intensity without signs/symptoms of physical distress.  -  Continue to progress workloads to maintain intensity without signs/symptoms of physical distress.  Continue to progress workloads to maintain intensity without signs/symptoms of physical distress.   Average METs  -  2.4  -  2.67  2.72     Resistance Training   Training Prescription  -  Yes  -  Yes  Yes   Weight  -  3 lb  -  3 lb  3 lbs   Reps  -  10-15  -  10-15  10-15     Interval Training   Interval Training  -  No  -  No  No     Treadmill   MPH  -  1.3  -  1.3  1.3   Grade  -  0  -  0  1   Minutes  -  15  -  15  15   METs  -  1.99  -  2  2.17     NuStep   Level  -  2  -  3  3   Minutes  -  15  -  15  15   METs  -  2.2  -  3.1  2.9     REL-XR   Level  -  2  -  5  5   Minutes  -  15  -  15  15   METs  -  3  -  2.9  3.1     Home Exercise Plan   Plans to continue exercise at  -  -  [place]comment[place]  [place]  [place]   Frequency  -  -  Add 2 additional days to program exercise sessions.  Add 2 additional days to program exercise sessions.  Add 2 additional days to program exercise sessions.   Initial Home Exercises Provided  -  -  06/14/18  06/14/18  06/14/18   Row Name 07/19/18 1600             Response to Exercise   Blood Pressure (Admit)  126/70       Blood Pressure (Exercise)  148/70       Blood Pressure (Exit)  98/60       Heart Rate (Admit)  62 bpm       Heart Rate (Exercise)  91 bpm       Heart Rate (Exit)  95 bpm       Rating of Perceived Exertion (Exercise)  13       Symptoms  none  Duration  Continue with 30 min of aerobic exercise without signs/symptoms of physical distress.       Intensity  THRR unchanged         Progression    Progression  Continue to progress workloads to maintain intensity without signs/symptoms of physical distress.       Average METs  2.72         Resistance Training   Training Prescription  Yes       Weight  3 lbs       Reps  10-15         Interval Training   Interval Training  No         Treadmill   MPH  1.3       Grade  1       Minutes  15       METs  2.17         NuStep   Level  3       Minutes  15       METs  2.9         REL-XR   Level  5       Minutes  15       METs  3.1         Home Exercise Plan   Plans to continue exercise at  [place] (comment[place]       Frequency  Add 2 additional days to program exercise sessions.       Initial Home Exercises Provided  06/14/18          Exercise Comments: Exercise Comments    Row Name 05/30/18 0804 06/28/18 1048 07/26/18 1156       Exercise Comments  First full day of exercise!  Patient was oriented to gym and equipment including functions, settings, policies, and procedures.  Patient's individual exercise prescription and treatment plan were reviewed.  All starting workloads were established based on the results of the 6 minute walk test done at initial orientation visit.  The plan for exercise progression was also introduced and progression will be customized based on patient's performance and goals.  Justin [name] reported passing blood after exercising too hard last session. He will be mindful of this during exercise and with increasing workloads. He had bladder cancer and reports that this happens when he does too much.   Reviewed home exercise with pt today.  Pt plans to continue walking and use gym at [facility] for exercise.  Reviewed THR, pulse, RPE, sign and symptoms, and when to call 911 or MD.  Also discussed weather considerations and indoor options.  Pt voiced understanding.        Exercise Goals and Review: Exercise Goals    Row Name 05/26/18 1242             Exercise Goals   Increase Physical  Activity  Yes       Intervention  Provide advice, education, support and counseling about physical activity/exercise needs.;Develop an individualized exercise prescription for aerobic and resistive training based on initial evaluation findings, risk stratification, comorbidities and participant's personal goals.       Expected Outcomes  Short Term: Attend rehab on a regular basis to increase amount of physical activity.;Long Term: Add in home exercise to make exercise part of routine and to increase amount of physical activity.;Long Term: Exercising regularly at least 3-5 days a week.       Increase Strength and Stamina  Yes       Intervention  Provide advice, education, support and counseling about physical activity/exercise needs.;Develop an individualized exercise prescription for aerobic and resistive training based on initial evaluation findings, risk stratification, comorbidities and participant's personal goals.       Expected Outcomes  Short Term: Increase workloads from initial exercise prescription for resistance, speed, and METs.;Short Term: Perform resistance training exercises routinely during rehab and add in resistance training at home;Long Term: Improve cardiorespiratory fitness, muscular endurance and strength as measured by increased METs and functional capacity (6MWT)       Able to understand and use rate of perceived exertion (RPE) scale  Yes       Intervention  Provide education and explanation on how to use RPE scale       Expected Outcomes  Short Term: Able to use RPE daily in rehab to express subjective intensity level;Long Term:  Able to use RPE to guide intensity level when exercising independently       Able to understand and use Dyspnea scale  Yes       Intervention  Provide education and explanation on how to use Dyspnea scale       Expected Outcomes  Short Term: Able to use Dyspnea scale daily in rehab to express subjective sense of shortness of breath during exertion;Long  Term: Able to use Dyspnea scale to guide intensity level when exercising independently       Knowledge and understanding of Target Heart Rate Range (THRR)  Yes       Intervention  Provide education and explanation of THRR including how the numbers were predicted and where they are located for reference       Expected Outcomes  Short Term: Able to state/look up THRR;Short Term: Able to use daily as guideline for intensity in rehab;Long Term: Able to use THRR to govern intensity when exercising independently       Able to check pulse independently  Yes       Intervention  Provide education and demonstration on how to check pulse in carotid and radial arteries.;Review the importance of being able to check your own pulse for safety during independent exercise       Expected Outcomes  Short Term: Able to explain why pulse checking is important during independent exercise;Long Term: Able to check pulse independently and accurately       Understanding of Exercise Prescription  Yes       Intervention  Provide education, explanation, and written materials on patient's individual exercise prescription       Expected Outcomes  Short Term: Able to explain program exercise prescription;Long Term: Able to explain home exercise prescription to exercise independently          Exercise Goals Re-Evaluation : Exercise Goals Re-Evaluation    Row Name 05/30/18 0804 06/06/18 1450 06/14/18 1043 06/21/18 1148 06/28/18 1018     Exercise Goal Re-Evaluation   Exercise Goals Review  Increase Physical Activity;Increase Strength and Stamina;Able to understand and use rate of perceived exertion (RPE) scale;Knowledge and understanding of Target Heart Rate Range (THRR);Understanding of Exercise Prescription  Increase Physical Activity;Increase Strength and Stamina;Understanding of Exercise Prescription  Increase Physical Activity;Able to understand and use rate of perceived exertion (RPE) scale;Knowledge and understanding of  Target Heart Rate Range (THRR);Increase Strength and Stamina  Increase Physical Activity;Increase Strength and Stamina;Understanding of Exercise Prescription  Increase Physical Activity;Increase Strength and Stamina;Understanding of Exercise Prescription   Comments  Reviewed RPE scale, THR and program  prescription with pt today.  Pt voiced understanding and was given a copy of goals to take home.   Justin [name] is off to a good start in rehab.  He is switching to the 9am class as he is struggling to get up early enough.  We will continue to monitor his progress.   Reviewed home exercise with pt today.  Pt plans to walk for exercise.  Reviewed THR, pulse, RPE, sign and symptoms, NTG use, and when to call 911 or MD.  Also discussed weather considerations and indoor options.  Pt voiced understanding.  Justin [name] is doing well in rehab.  He is now up to level 5 on the XR and level 3 on the NuStep.  We will try to start to increase his treadmill and continue to monitor his progression.   Justin [name] is doing well in rehab. He enjoys the XR machine most. He was scared to walk on the treadmill and has overcome that fear. We will remind him to try to relax on the treadmill. We will continue to monitor his progression.   Expected Outcomes  Short: Use RPE daily to regulate intensity. Long: Follow program prescription in THR.  Short: Continue to attend new class regularly.  Long: Continue to increase strength and stamina.   Short - walk at least twice per week outside program sessions Long - maintain exercise on his own  Short: Increase treadmill.  Long: Continue to increase walking at home.   Short: Increase treadmill. Long: Continue to increase walking at home. Living situation interferes with walking more but he will try.   [place] Name 06/30/18 1025 07/05/18 1549 07/19/18 1635         Exercise Goal Re-Evaluation   Exercise Goals Review  Increase Physical Activity;Increase Strength and Stamina;Understanding of Exercise Prescription   Increase Physical Activity;Increase Strength and Stamina;Understanding of Exercise Prescription  Increase Physical Activity;Increase Strength and Stamina;Understanding of Exercise Prescription     Comments  Justin [name] is walking around a quarter of a mile track for 20 minutes on the days he is not here. Justin [name] will steadily increase to 30 minutes. He will go to the [facility] in [place] when the weather does not permit his walking outside.   Justin [name] continues to do well in rehab.  He is now up to level 3 on the NuStep and added a 1% grade on the treadmill.  We will continue to monitor his progression.   Justin [name] will be graduating next Tuesday!!  He is planning to continue to exercise by walking at home and going to [facility].  He is toying with idea of [employer] as he is not sure if he will go consisently on his own.      Expected Outcomes  Short: Increase walk time to 30 minutes 3 additional days per week. Long: Exercise independently and continue to increase and vary exercises at the [facility] in [place] (bike, treadmill, stepper).  Short: Increase speed on treadmill.  Long: Continue to increase strength and stamina.   Short: Graduate!!  Long: Continue to exercise independently.         Discharge Exercise Prescription (Final Exercise Prescription Changes): Exercise Prescription Changes - 07/19/18 1600      Response to Exercise   Blood Pressure (Admit)  126/70    Blood Pressure (Exercise)  148/70    Blood Pressure (Exit)  98/60    Heart Rate (Admit)  62 bpm    Heart Rate (Exercise)  91 bpm    Heart  Rate (Exit)  95 bpm    Rating of Perceived Exertion (Exercise)  13    Symptoms  none    Duration  Continue with 30 min of aerobic exercise without signs/symptoms of physical distress.    Intensity  THRR unchanged      Progression   Progression  Continue to progress workloads to maintain intensity without signs/symptoms of physical distress.    Average METs  2.72      Resistance  Training   Training Prescription  Yes    Weight  3 lbs    Reps  10-15      Interval Training   Interval Training  No      Treadmill   MPH  1.3    Grade  1    Minutes  15    METs  2.17      NuStep   Level  3    Minutes  15    METs  2.9      REL-XR   Level  5    Minutes  15    METs  3.1      Home Exercise Plan   Plans to continue exercise at  Home (comment)   [place]   Frequency  Add 2 additional days to program exercise sessions.    Initial Home Exercises Provided  06/14/18       Nutrition:  Target Goals: Understanding of nutrition guidelines, daily intake of sodium <1541m, cholesterol <2086m calories 30% from fat and 7% or less from saturated fats, daily to have 5 or more servings of fruits and vegetables.  Biometrics: Pre Biometrics - 05/26/18 1241      Pre Biometrics   Height  _0  (1.702 m)    Weight  195 lb 12.8 oz (88.8 kg)    Waist Circumference  40.5 inches    Hip Circumference  43 inches    Waist to Hip Ratio  0.94 %    BMI (Calculated)  30.66      Post Biometrics - 07/14/18 1006       Post  Biometrics   Height  _1  (1.702 m)    Weight  190 lb (86.2 kg)    Waist Circumference  40 inches    Hip Circumference  42 inches    Waist to Hip Ratio  0.95 %    BMI (Calculated)  29.75    Single Leg Stand  3.02 seconds       Nutrition Therapy Plan and Nutrition Goals: Nutrition Therapy & Goals - 06/01/18 1449      Nutrition Therapy   Diet  TLC    Drug/Food Interactions  Statins/Certain Fruits    Protein (specify units)  12oz    Fiber  30 grams    Whole Grain Foods  3 servings   eats whole grains regularly   Saturated Fats  16 max. grams    Fruits and Vegetables  6 servings/day   8 ideal; eats multiple servings of fruits and vegetables daily   Sodium  1500 grams      Personal Nutrition Goals   Nutrition Goal  Continue to reduce intake of processed snack foods and added sugars, and to limit sodium intake, great job     Personal Goal #2  Work with your housemates to cook a wider variety of vegetables each week    Comments  He and his wife live with another couple who are not as health conscious, and the families share  cooking responsibilites, so eating a diet they prefer is sometimes challenging. They choose whole grain products, eat a variety of fruits/ vegetables, and choose lower sugar dessert items. They recently purchased a dehydrator and are in the process of making their own dried fruit and trail mix without added sugar. They choose SF beverages and eat at home 99% of the time. They choose low sodium options when they are able      Intervention Plan   Intervention  Prescribe, educate and counsel regarding individualized specific dietary modifications aiming towards targeted core components such as weight, hypertension, lipid management, diabetes, heart failure and other comorbidities.;Nutrition handout(s) given to patient.   no-salt seasoning blends handout; low sodium nutrition therapy handout   Expected Outcomes  Short Term Goal: Understand basic principles of dietary content, such as calories, fat, sodium, cholesterol and nutrients.;Short Term Goal: A plan has been developed with personal nutrition goals set during dietitian appointment.;Long Term Goal: Adherence to prescribed nutrition plan.       Nutrition Assessments: Nutrition Assessments - 07/12/18 1110      MEDFICTS Scores   Pre Score  56    Post Score  --   declined nutrition survery      Nutrition Goals Re-Evaluation: Nutrition Goals Re-Evaluation    Justin [name] Name 06/01/18 1457 06/16/18 1040           Goals   Nutrition Goal  Continue to reduce intake of processed snack foods and added sugars, and to limit sodium intake, great job  Continue to reduce intake of processed snack foods and added sugars and to limit sodium; work with your housemates to [employer] a wider variety of vegetables each week      Comment  He and his wife have made  significant changes to their diet in recent years and try to eat minimal processed foods other than the occasional snack food.  He reports the living situation with his housemates to be declining, and he and his wife are looking for another place to live. This has put stress on him and limits ability to cook the foods he desires but he is still making an effort to eat minimally processed foods low in salt and sugar. He is working to lose weight and would like some recipe ideas Eating Well [place]      Expected Outcome  He will continue current diet practices and make the best decisions for his health food-wise, even if others in the household are not making choices that are as nutrient-dense  He will continue to control the variables that he is able to control with his current living situation until he is able to move. Continue to eat a variety of foods that are minimally processed and lower in sugar and salt. He and his wife will utilize recipe ideas provided to help them make different heart healthy recipes        Personal Goal #2 Re-Evaluation   Personal Goal #2  Work with your housemates to cook a wider variety of vegetables each week  -         Nutrition Goals Discharge (Final Nutrition Goals Re-Evaluation): Nutrition Goals Re-Evaluation - 06/16/18 1040      Goals   Nutrition Goal  Continue to reduce intake of processed snack foods and added sugars and to limit sodium; work with your housemates to cook a wider variety of vegetables each week    Comment  He reports the living situation with his housemates  to be declining, and he and his wife are looking for another place to live. This has put stress on him and limits ability to cook the foods he desires but he is still making an effort to eat minimally processed foods low in salt and sugar. He is working to lose weight and would like some recipe ideas   Eating Well [place]    Expected Outcome  He will continue to control the variables that he is able to control with his current living situation until he is able to move. Continue to eat a variety of foods that are minimally processed and lower in sugar and salt. He and his wife will utilize recipe ideas provided to help them make different heart healthy recipes       Psychosocial: Target Goals: Acknowledge presence or absence of significant depression and/or stress, maximize coping skills, provide positive support system. Participant is able to verbalize types and ability to use techniques and skills needed for reducing stress and depression.   Initial Review & Psychosocial Screening: Initial Psych Review & Screening - 06/28/18 1027      Initial Review   Current issues with  --    Source of Stress Concerns  --    Comments  --      Family Dynamics   Good Support System?  Yes      Barriers   Psychosocial barriers to participate in program  --      Screening Interventions   Interventions  --       Quality of Life Scores:  Quality of Life - 07/12/18 1113      Quality of Life   Select  Quality of Life      Quality of Life Scores   Health/Function Pre  14.96 %    Health/Function Post  13 %    Health/Function % Change  -13.1 %    Socioeconomic Pre  12.6 %    Socioeconomic Post  11.33 %    Socioeconomic % Change   -10.08 %    Psych/Spiritual Pre  11 %    Psych/Spiritual Post  13.71 %    Psych/Spiritual % Change  24.64 %    Family Pre  14.6 %    Family Post  15.4 %    Family % Change  5.48 %    GLOBAL Pre  14.13 %    GLOBAL Post  13.21 %    GLOBAL % Change  -6.51 %      Scores of 19 and below usually indicate a poorer quality of life in these areas.  A difference of  2-3 points is a clinically meaningful difference.  A difference of 2-3 points in the total score of the Quality of Life Index has been associated with significant improvement in overall quality of life, self-image, physical symptoms,  and general health in studies assessing change in quality of life.  PHQ-9: Recent Review Flowsheet Data    Depression screen [facility] 2/9 07/12/2018 05/26/2018   Decreased Interest 0 2   Down, Depressed, Hopeless 1 0   PHQ - 2 Score 1 2   Altered sleeping 0 0   Tired, decreased energy 0 1   Change in appetite 0 1   Feeling bad or failure about yourself  0 1   Trouble concentrating 0 2   Moving slowly or fidgety/restless 0 0   Suicidal thoughts 0 0   PHQ-9 Score 1 7   Difficult doing  work/chores Not difficult at all Not difficult at all     Interpretation of Total Score  Total Score Depression Severity:  1-4 = Minimal depression, 5-9 = Mild depression, 10-14 = Moderate depression, 15-19 = Moderately severe depression, 20-27 = Severe depression   Psychosocial Evaluation and Intervention: Psychosocial Evaluation - 06/07/18 1049      Psychosocial Evaluation & Interventions   Interventions  Encouraged to exercise with the program and follow exercise prescription;Stress management education    Comments  Counselor met with Justin [name]) today for initial psychosocial evaluation.  He is a 77 year old who had a heart attack and stent inserted on 9/7.  He has some kidney problems and a history of a brain tumor and hearing/sight problems in addition to his cardiac condition.  Justin [name] has a spouse of 74 years and some friends that he lives with for his support system.  He has (2) children who live out of state.  Justin [name] reports sleeping "lousy" with maybe 3-4 hours per night.  He has a good appetite. [place] denies a history of depression or anxiety or any current symptoms and states he is generally in a good mood.  However, Arrian was very tearful in speaking with this counselor and has had some significant losses with the death of all of his immediate family members and a best friend who committed suicide years ago - but still impactful.  His primary stressors are his health and finances.  Yuya's goals are  to "survive" this class and be healthier in general.  He will be followed by staff    Expected Outcomes  Short:  Melchizedek will exercise for his health and mental health and hopefully improve his sleep as well.  Long:  Lora will develop healthier lifestyle routines for his health and mental health.    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 He says the only way that will get better is if he moves.   Counselor follow up today with Justin [name] reporting stressful living conditions with friends the past 5-6 years.  He reports the male owner of the home "despises me."  Counselor processed other options and explored ways to cope with [place] today.  He attended the stress management education last week and counselor encouraged him to read over the materials.  Justin [name] was able to identify positive ways to deal with his stress.  He continues to have difficulty sleeping and reports refusal of a CPAP as he is suspicious of medical providers at times.  Counselor encouraged possibly finding a therapist and he agreed to discuss this further as to whether he is willing to do so.  He denies SI/HI at this time - and was willing to process feelings well today.  Counselor will follow.  Justin [name] still states his stress would be lower if he moves.  He does feel the issue is dissipating as time goes on.  He feels he deals with stress in a healthy manner.    Expected Outcomes  Short: Recommend talking with psychosocial counselor next class. Long: Continue exercising  for stress reduction and improved sleep.  Short:  Recommend talking with psychosocial counselor next class. Long: Continue exercising for stress reduction and improved sleep.  Short:  Justin [name] will read and practice positive coping strategies and determine what he can and cannot control.  He will exercise for his health and mental health.  Long:   Gabrial may consider counseling and reconsider a CPAP to improve his quality of life overall.    Short - complete HT Long - continue to exercrise and manage stress on his own    Interventions  -  Encouraged to attend Cardiac Rehabilitation for the exercise;Relaxation education;Stress management education  -  -    Continue Psychosocial Services   -  Follow up required by counselor  -  -       Psychosocial Discharge (Final Psychosocial Re-Evaluation): Psychosocial Re-Evaluation - 07/19/18 1026      Psychosocial Re-Evaluation   Current issues with  Current Stress Concerns    Comments  Justin [name] still states his stress would be lower if he moves.  He does feel the issue is dissipating as time goes on.  He feels he deals with stress in a healthy manner.    Expected Outcomes  Short - complete HT Long - continue to exercrise and manage stress on his own       Vocational Rehabilitation: Provide vocational rehab assistance to qualifying candidates.   Vocational Rehab Evaluation & Intervention: Vocational Rehab - 05/26/18 1305      Initial Vocational Rehab Evaluation & Intervention   Assessment shows need for Vocational Rehabilitation  No       Education: Education Goals: Education classes will be provided on a variety of topics geared toward better understanding of heart health and risk factor modification. Participant will state understanding/return demonstration of topics presented as noted by education test scores.  Learning Barriers/Preferences: Learning Barriers/Preferences - 05/26/18 1302      Learning Barriers/Preferences   Learning Barriers  Hearing    Learning Preferences  Verbal Instruction        Education Topics:  AED/CPR: - Group verbal and written instruction with the use of models to demonstrate the basic use of the AED with the basic ABC's of resuscitation.   Cardiac Rehab from 07/26/2018 in [facility] Cardiac and Pulmonary Rehab  Date  07/07/18  Educator  SB  Instruction Review Code  1- Verbalizes Understanding      General Nutrition Guidelines/Fats and Fiber: -Group instruction provided by verbal, written material, models and posters to present the general guidelines for heart healthy nutrition. Gives an explanation and review of dietary fats and fiber.   Cardiac Rehab from 07/26/2018 in [facility] Cardiac and Pulmonary Rehab  Date  07/12/18  Educator  LB  Instruction Review Code  1- Verbalizes Understanding      Controlling Sodium/Reading Food Labels: -Group verbal and written material supporting the discussion of sodium use in heart healthy nutrition. Review and explanation with models, verbal and written materials for utilization of the food label.   Cardiac Rehab from 07/26/2018 in [facility] Cardiac and Pulmonary Rehab  Date  07/14/18  Educator  LB  Instruction Review Code  1- Verbalizes Understanding      Exercise Physiology & General Exercise Guidelines: - Group verbal and written instruction with models to review the exercise physiology of the cardiovascular system and associated critical values. Provides general exercise guidelines with specific guidelines to those with heart or lung disease.    Cardiac Rehab from 07/26/2018 in [facility] Cardiac and  Pulmonary Rehab  Date  07/26/18  Educator  [facility]  Instruction Review Code  1- Verbalizes Understanding      Aerobic Exercise & Resistance Training: - Gives group verbal and written instruction on the various components of exercise. Focuses on aerobic and resistive training programs and the benefits of this training and how to safely progress through these programs..   Cardiac Rehab from 07/26/2018 in [facility] Cardiac and Pulmonary  Rehab  Date  06/09/18  Educator  [facility]  Instruction Review Code  1- Verbalizes Understanding      Flexibility, Balance, Mind/Body Relaxation: Provides group verbal/written instruction on the benefits of flexibility and balance training, including mind/body exercise modes such as yoga, pilates and tai chi.  Demonstration and skill practice provided.   Cardiac Rehab from 07/26/2018 in [facility] Cardiac and Pulmonary Rehab  Date  06/14/18  Educator  AS  Instruction Review Code  1- Verbalizes Understanding      Stress and Anxiety: - Provides group verbal and written instruction about the health risks of elevated stress and causes of high stress.  Discuss the correlation between heart/lung disease and anxiety and treatment options. Review healthy ways to manage with stress and anxiety.   Cardiac Rehab from 07/26/2018 in [facility] Cardiac and Pulmonary Rehab  Date  06/21/18  Educator  kc  Instruction Review Code  1- Verbalizes Understanding      Depression: - Provides group verbal and written instruction on the correlation between heart/lung disease and depressed mood, treatment options, and the stigmas associated with seeking treatment.   Cardiac Rehab from 07/26/2018 in [facility] Cardiac and Pulmonary Rehab  Date  06/07/18  Educator  [facility]  Instruction Review Code  1- Verbalizes Understanding      Anatomy & Physiology of the Heart: - Group verbal and written instruction and models provide basic cardiac anatomy and physiology, with the coronary electrical and arterial systems. Review of Valvular disease and Heart Failure   Cardiac Rehab from 07/26/2018 in [facility] Cardiac and Pulmonary Rehab  Date  06/23/18  Educator  CE  Instruction Review Code  1- Verbalizes Understanding      Cardiac Procedures: - Group verbal and written instruction to review commonly prescribed medications for heart disease. Reviews the medication, class of the drug, and side effects. Includes the steps to properly store meds and  maintain the prescription regimen. (beta blockers and nitrates)   Cardiac Rehab from 07/26/2018 in [facility] Cardiac and Pulmonary Rehab  Date  07/05/18  Educator  KS  Instruction Review Code  1- Verbalizes Understanding      Cardiac Medications I: - Group verbal and written instruction to review commonly prescribed medications for heart disease. Reviews the medication, class of the drug, and side effects. Includes the steps to properly store meds and maintain the prescription regimen.   Cardiac Rehab from 07/26/2018 in [facility] Cardiac and Pulmonary Rehab  Date  06/28/18  Educator  SB  Instruction Review Code  1- Verbalizes Understanding      Cardiac Medications II: -Group verbal and written instruction to review commonly prescribed medications for heart disease. Reviews the medication, class of the drug, and side effects. (all other drug classes)   Cardiac Rehab from 07/26/2018 in [facility] Cardiac and Pulmonary Rehab  Date  06/16/18  Educator  CE  Instruction Review Code  1- Verbalizes Understanding       Go Sex-Intimacy & Heart Disease, Get SMART - Goal Setting: - Group verbal and written instruction through game format to discuss heart disease and  the return to sexual intimacy. Provides group verbal and written material to discuss and apply goal setting through the application of the S.M.A.R.T. Method.   Cardiac Rehab from 07/26/2018 in [facility] Cardiac and Pulmonary Rehab  Date  07/05/18  Educator  [place]  Instruction Review Code  1- Verbalizes Understanding      Other Matters of the Heart: - Provides group verbal, written materials and models to describe Stable Angina and Peripheral Artery. Includes description of the disease process and treatment options available to the cardiac patient.   Cardiac Rehab from 07/26/2018 in [facility] Cardiac and Pulmonary Rehab  Date  06/23/18  Educator  CE  Instruction Review Code  1- Verbalizes Understanding      Exercise & Equipment Safety: - Individual  verbal instruction and demonstration of equipment use and safety with use of the equipment.   Cardiac Rehab from 07/26/2018 in [facility] Cardiac and Pulmonary Rehab  Date  05/26/18  Educator  [facility]  Instruction Review Code  1- Verbalizes Understanding      Infection Prevention: - Provides verbal and written material to individual with discussion of infection control including proper hand washing and proper equipment cleaning during exercise session.   Cardiac Rehab from 07/26/2018 in [facility] Cardiac and Pulmonary Rehab  Date  05/26/18  Educator  [facility]  Instruction Review Code  1- Verbalizes Understanding      Falls Prevention: - Provides verbal and written material to individual with discussion of falls prevention and safety.   Cardiac Rehab from 07/26/2018 in [facility] Cardiac and Pulmonary Rehab  Date  05/26/18  Educator  [facility]  Instruction Review Code  1- Verbalizes Understanding      Diabetes: - Individual verbal and written instruction to review signs/symptoms of diabetes, desired ranges of glucose level fasting, after meals and with exercise. Acknowledge that pre and post exercise glucose checks will be done for 3 sessions at entry of program.   Know Your Numbers and Risk Factors: -Group verbal and written instruction about important numbers in your health.  Discussion of what are risk factors and how they play a role in the disease process.  Review of Cholesterol, Blood Pressure, Diabetes, and BMI and the role they play in your overall health.   Cardiac Rehab from 07/26/2018 in [facility] Cardiac and Pulmonary Rehab  Date  06/16/18  Educator  CE  Instruction Review Code  1- Verbalizes Understanding      Sleep Hygiene: -Provides group verbal and written instruction about how sleep can affect your health.  Define sleep hygiene, discuss sleep cycles and impact of sleep habits. Review good sleep hygiene tips.    Cardiac Rehab from 07/26/2018 in [facility] Cardiac and Pulmonary Rehab  Date  07/19/18   Educator  [facility]  Instruction Review Code  1- Verbalizes Understanding      Other: -Provides group and verbal instruction on various topics (see comments)   Knowledge Questionnaire Score: Knowledge Questionnaire Score - 07/12/18 1110      Knowledge Questionnaire Score   Pre Score  17/26    Post Score  21/26   reviewed with pt today      Core Components/Risk Factors/Patient Goals at Admission: Personal Goals and Risk Factors at Admission - 06/28/18 1039      Core Components/Risk Factors/Patient Goals on Admission   Intervention  Monitor prescription use compliance.       Core Components/Risk Factors/Patient Goals Review:  Goals and Risk Factor Review    Row Name 06/28/18 1041 07/19/18 1022  Core Components/Risk Factors/Patient Goals Review   Personal Goals Review  Weight Management/Obesity;Lipids;Hypertension  Other;Weight Management/Obesity;Hypertension;Stress;Lipids      Review  Justin [name] has lost weight down to 190lbs. He attributes this to nutrition changes.  No bloodwork recently for lipids. He continues to take medications. He has not been taking his blood pressure at home but will start. He does have a blood pressure cuff.   Justin [name] is taking all meds as directed.  He hasn't stretd taking BP at home yet.  We discussed checking it at least once per day at the same time.  He states he would like to move and needs more income to do so.  The stress from where he lives is dissipating as time goes on.  Staff gave info on [employer] and [place] as options to continue to exercise. He also expressed he doesn't sleep well.  Staff recommended he talk with his Dr about sleep concerns.      Expected Outcomes  Short: Justin [name] is going to start taking blood pressure at home. Long: Continue taking meds as prescribed, check bp at home, walk outside of class, and try to find ways to relax when stressed.  Short - take BP at home, follow up about joining [facility] - maintain exercise on his own          Core Components/Risk Factors/Patient Goals at Discharge (Final Review):  Goals and Risk Factor Review - 07/19/18 1022      Core Components/Risk Factors/Patient Goals Review   Personal Goals Review  Other;Weight Management/Obesity;Hypertension;Stress;Lipids    Review  Justin [name] is taking all meds as directed.  He hasn't stretd taking BP at home yet.  We discussed checking it at least once per day at the same time.  He states he would like to move and needs more income to do so.  The stress from where he lives is dissipating as time goes on.  Staff gave info on [employer] and [place] as options to continue to exercise. He also expressed he doesn't sleep well.  Staff recommended he talk with his Dr about sleep concerns.    Expected Outcomes  Short - take BP at home, follow up about joining [facility] - maintain exercise on his own       ITP Comments: ITP Comments    Row Name 05/26/18 1251 06/15/18 0643 07/13/18 0607 07/26/18 1156     ITP Comments  Med Review completed. Initial ITP created. Diagnosis can be found in [facility] 9/7  30 day review.  Continue with ITP unless directed changes per Medical Director review.  30 day review. Continue with ITP unless direccted changes per Medical Director Chart Review.  Discharge ITP sent and signed by Dr. Sabra Heck.  Discharge Summary routed to PCP and cardiologist.       Comments: Discharge ITP

## 2018-07-26 NOTE — Progress Notes (Signed)
Discharge Progress Report  Patient Details  Name: Justin [name] MRN: [id number] Date of Birth: [birth year] Referring Provider:     Cardiac Rehab from 05/26/2018 in [facility]  Referring Provider  Paraschos       Number of Visits: 25  Reason for Discharge:  Patient reached a stable level of exercise. Patient independent in their exercise. Patient has met program and personal goals.  Smoking History:  Social History   Tobacco Use  Smoking Status Former Smoker  . Packs/day: 2.00  . Types: Cigarettes  . Last attempt to quit: 01/17/1979  . Years since quitting: 39.5  Smokeless Tobacco Never Used    Diagnosis:  ST elevation myocardial infarction (STEMI), unspecified artery (HCC)  S/P coronary artery stent placement  ADL UCSD:   Initial Exercise Prescription: Initial Exercise Prescription - 05/26/18 1200      Date of Initial Exercise RX and Referring Provider   Date  05/26/18    Referring Provider  Paraschos      Treadmill   MPH  1.3    Grade  0    Minutes  15    METs  2      NuStep   Level  2    SPM  80    Minutes  15    METs  2      REL-XR   Level  2    Speed  50    Minutes  15    METs  2      Prescription Details   Frequency (times per week)  3    Duration  Progress to 45 minutes of aerobic exercise without signs/symptoms of physical distress      Intensity   THRR 40-80% of Max Heartrate  94-126    Ratings of Perceived Exertion  11-13    Perceived Dyspnea  0-4      Resistance Training   Training Prescription  Yes    Weight  3 lb    Reps  10-15       Discharge Exercise Prescription (Final Exercise Prescription Changes): Exercise Prescription Changes - 07/19/18 1600      Response to Exercise   Blood Pressure (Admit)  126/70    Blood Pressure (Exercise)  148/70    Blood Pressure (Exit)  98/60    Heart Rate (Admit)  62 bpm    Heart Rate (Exercise)  91 bpm    Heart Rate (Exit)  95 bpm    Rating of Perceived Exertion  (Exercise)  13    Symptoms  none    Duration  Continue with 30 min of aerobic exercise without signs/symptoms of physical distress.    Intensity  THRR unchanged      Progression   Progression  Continue to progress workloads to maintain intensity without signs/symptoms of physical distress.    Average METs  2.72      Resistance Training   Training Prescription  Yes    Weight  3 lbs    Reps  10-15      Interval Training   Interval Training  No      Treadmill   MPH  1.3    Grade  1    Minutes  15    METs  2.17      NuStep   Level  3    Minutes  15    METs  2.9      REL-XR   Level  5    Minutes  15    METs  3.1      Home Exercise Plan   Plans to continue exercise at  Home (comment)   [place]   Frequency  Add 2 additional days to program exercise sessions.    Initial Home Exercises Provided  06/14/18       Functional Capacity: 6 Minute Walk    Row Name 05/26/18 1243 07/14/18 1004       6 Minute Walk   Phase  Initial  Discharge    Distance  1045 feet  1190 feet    Distance % Change  -  13.8 %    Distance Feet Change  -  145 ft    Walk Time  6 minutes  6 minutes    # of Rest Breaks  0  0    MPH  1.98  2.25    METS  1.94  2.19    RPE  11  13    Perceived Dyspnea   0  0    VO2 Peak  6.78  7.69    Symptoms  No  No    Resting HR  61 bpm  67 bpm    Resting BP  104/56  110/62    Resting Oxygen Saturation   96 %  -    Exercise Oxygen Saturation  during 6 min walk  97 %  -    Max Ex. HR  103 bpm  102 bpm    Max Ex. BP  134/60  136/68    2 Minute Post BP  100/58  -       Psychological, QOL, Others - Outcomes: PHQ 2/9: Depression screen [facility] 2/9 07/12/2018 05/26/2018  Decreased Interest 0 2  Down, Depressed, Hopeless 1 0  PHQ - 2 Score 1 2  Altered sleeping 0 0  Tired, decreased energy 0 1  Change in appetite 0 1  Feeling bad or failure about yourself  0 1  Trouble concentrating 0 2  Moving slowly or fidgety/restless 0 0  Suicidal thoughts  0 0  PHQ-9 Score 1 7  Difficult doing work/chores Not difficult at all Not difficult at all    Quality of Life: Quality of Life - 07/12/18 1113      Quality of Life   Select  Quality of Life      Quality of Life Scores   Health/Function Pre  14.96 %    Health/Function Post  13 %    Health/Function % Change  -13.1 %    Socioeconomic Pre  12.6 %    Socioeconomic Post  11.33 %    Socioeconomic % Change   -10.08 %    Psych/Spiritual Pre  11 %    Psych/Spiritual Post  13.71 %    Psych/Spiritual % Change  24.64 %    Family Pre  14.6 %    Family Post  15.4 %    Family % Change  5.48 %    GLOBAL Pre  14.13 %    GLOBAL Post  13.21 %    GLOBAL % Change  -6.51 %       Personal Goals: Goals established at orientation with interventions provided to work toward goal. Personal Goals and Risk Factors at Admission - 06/28/18 1039      Core Components/Risk Factors/Patient Goals on Admission   Intervention  Monitor prescription use compliance.        Personal Goals Discharge: Goals and Risk Factor Review  [place] Name 06/28/18 1041 07/19/18 1022           Core Components/Risk Factors/Patient Goals Review   Personal Goals Review  Weight Management/Obesity;Lipids;Hypertension  Other;Weight Management/Obesity;Hypertension;Stress;Lipids      Review  Justin [name] has lost weight down to 190lbs. He attributes this to nutrition changes.  No bloodwork recently for lipids. He continues to take medications. He has not been taking his blood pressure at home but will start. He does have a blood pressure cuff.   Justin [name] is taking all meds as directed.  He hasn't stretd taking BP at home yet.  We discussed checking it at least once per day at the same time.  He states he would like to move and needs more income to do so.  The stress from where he lives is dissipating as time goes on.  Staff gave info on [employer] and [place] as options to continue to exercise. He also expressed he doesn't sleep well.  Staff  recommended he talk with his Dr about sleep concerns.      Expected Outcomes  Short: Justin [name] is going to start taking blood pressure at home. Long: Continue taking meds as prescribed, check bp at home, walk outside of class, and try to find ways to relax when stressed.  Short - take BP at home, follow up about joining [facility] Long - maintain exercise on his own         Exercise Goals and Review: Exercise Goals    Row Name 05/26/18 1242             Exercise Goals   Increase Physical Activity  Yes       Intervention  Provide advice, education, support and counseling about physical activity/exercise needs.;Develop an individualized exercise prescription for aerobic and resistive training based on initial evaluation findings, risk stratification, comorbidities and participant's personal goals.       Expected Outcomes  Short Term: Attend rehab on a regular basis to increase amount of physical activity.;Long Term: Add in home exercise to make exercise part of routine and to increase amount of physical activity.;Long Term: Exercising regularly at least 3-5 days a week.       Increase Strength and Stamina  Yes       Intervention  Provide advice, education, support and counseling about physical activity/exercise needs.;Develop an individualized exercise prescription for aerobic and resistive training based on initial evaluation findings, risk stratification, comorbidities and participant's personal goals.       Expected Outcomes  Short Term: Increase workloads from initial exercise prescription for resistance, speed, and METs.;Short Term: Perform resistance training exercises routinely during rehab and add in resistance training at home;Long Term: Improve cardiorespiratory fitness, muscular endurance and strength as measured by increased METs and functional capacity (6MWT)       Able to understand and use rate of perceived exertion (RPE) scale  Yes       Intervention  Provide education and explanation on how to  use RPE scale       Expected Outcomes  Short Term: Able to use RPE daily in rehab to express subjective intensity level;Long Term:  Able to use RPE to guide intensity level when exercising independently       Able to understand and use Dyspnea scale  Yes       Intervention  Provide education and explanation on how to use Dyspnea scale       Expected Outcomes  Short Term: Able to use Dyspnea scale daily in rehab  to express subjective sense of shortness of breath during exertion;Long Term: Able to use Dyspnea scale to guide intensity level when exercising independently       Knowledge and understanding of Target Heart Rate Range (THRR)  Yes       Intervention  Provide education and explanation of THRR including how the numbers were predicted and where they are located for reference       Expected Outcomes  Short Term: Able to state/look up THRR;Short Term: Able to use daily as guideline for intensity in rehab;Long Term: Able to use THRR to govern intensity when exercising independently       Able to check pulse independently  Yes       Intervention  Provide education and demonstration on how to check pulse in carotid and radial arteries.;Review the importance of being able to check your own pulse for safety during independent exercise       Expected Outcomes  Short Term: Able to explain why pulse checking is important during independent exercise;Long Term: Able to check pulse independently and accurately       Understanding of Exercise Prescription  Yes       Intervention  Provide education, explanation, and written materials on patient's individual exercise prescription       Expected Outcomes  Short Term: Able to explain program exercise prescription;Long Term: Able to explain home exercise prescription to exercise independently          Exercise Goals Re-Evaluation: Exercise Goals Re-Evaluation    Row Name 05/30/18 0804 06/06/18 1450 06/14/18 1043 06/21/18 1148 06/28/18 1018     Exercise Goal  Re-Evaluation   Exercise Goals Review  Increase Physical Activity;Increase Strength and Stamina;Able to understand and use rate of perceived exertion (RPE) scale;Knowledge and understanding of Target Heart Rate Range (THRR);Understanding of Exercise Prescription  Increase Physical Activity;Increase Strength and Stamina;Understanding of Exercise Prescription  Increase Physical Activity;Able to understand and use rate of perceived exertion (RPE) scale;Knowledge and understanding of Target Heart Rate Range (THRR);Increase Strength and Stamina  Increase Physical Activity;Increase Strength and Stamina;Understanding of Exercise Prescription  Increase Physical Activity;Increase Strength and Stamina;Understanding of Exercise Prescription   Comments  Reviewed RPE scale, THR and program prescription with pt today.  Pt voiced understanding and was given a copy of goals to take home.   Justin [name] is off to a good start in rehab.  He is switching to the 9am class as he is struggling to get up early enough.  We will continue to monitor his progress.   Reviewed home exercise with pt today.  Pt plans to walk for exercise.  Reviewed THR, pulse, RPE, sign and symptoms, NTG use, and when to call 911 or MD.  Also discussed weather considerations and indoor options.  Pt voiced understanding.  Justin [name] is doing well in rehab.  He is now up to level 5 on the XR and level 3 on the NuStep.  We will try to start to increase his treadmill and continue to monitor his progression.   Justin [name] is doing well in rehab. He enjoys the XR machine most. He was scared to walk on the treadmill and has overcome that fear. We will remind him to try to relax on the treadmill. We will continue to monitor his progression.   Expected Outcomes  Short: Use RPE daily to regulate intensity. Long: Follow program prescription in THR.  Short: Continue to attend new class regularly.  Long: Continue to increase strength and stamina.   Short -  walk at least twice per week  outside program sessions Long - maintain exercise on his own  Short: Increase treadmill.  Long: Continue to increase walking at home.   Short: Increase treadmill. Long: Continue to increase walking at home. Living situation interferes with walking more but he will try.   [place] Name 06/30/18 1025 07/05/18 1549 07/19/18 1635         Exercise Goal Re-Evaluation   Exercise Goals Review  Increase Physical Activity;Increase Strength and Stamina;Understanding of Exercise Prescription  Increase Physical Activity;Increase Strength and Stamina;Understanding of Exercise Prescription  Increase Physical Activity;Increase Strength and Stamina;Understanding of Exercise Prescription     Comments  Justin [name] is walking around a quarter of a mile track for 20 minutes on the days he is not here. Justin [name] will steadily increase to 30 minutes. He will go to the [facility] in [place] when the weather does not permit his walking outside.   Justin [name] continues to do well in rehab.  He is now up to level 3 on the NuStep and added a 1% grade on the treadmill.  We will continue to monitor his progression.   Justin [name] will be graduating next Tuesday!!  He is planning to continue to exercise by walking at home and going to [facility].  He is toying with idea of [employer] as he is not sure if he will go consisently on his own.      Expected Outcomes  Short: Increase walk time to 30 minutes 3 additional days per week. Long: Exercise independently and continue to increase and vary exercises at the [facility] in [place] (bike, treadmill, stepper).  Short: Increase speed on treadmill.  Long: Continue to increase strength and stamina.   Short: Graduate!!  Long: Continue to exercise independently.         Nutrition & Weight - Outcomes: Pre Biometrics - 05/26/18 1241      Pre Biometrics   Height  _0  (1.702 m)    Weight  195 lb 12.8 oz (88.8 kg)    Waist Circumference  40.5 inches    Hip Circumference  43 inches    Waist to Hip  Ratio  0.94 %    BMI (Calculated)  30.66      Post Biometrics - 07/14/18 1006       Post  Biometrics   Height  _1  (1.702 m)    Weight  190 lb (86.2 kg)    Waist Circumference  40 inches    Hip Circumference  42 inches    Waist to Hip Ratio  0.95 %    BMI (Calculated)  29.75    Single Leg Stand  3.02 seconds       Nutrition: Nutrition Therapy & Goals - 06/01/18 1449      Nutrition Therapy   Diet  TLC    Drug/Food Interactions  Statins/Certain Fruits    Protein (specify units)  12oz    Fiber  30 grams    Whole Grain Foods  3 servings   eats whole grains regularly   Saturated Fats  16 max. grams    Fruits and Vegetables  6 servings/day   8 ideal; eats multiple servings of fruits and vegetables daily   Sodium  1500 grams      Personal Nutrition Goals   Nutrition Goal  Continue to reduce intake of processed snack foods and added sugars, and to limit sodium intake, great job    Personal Goal #2  Work with  your housemates to cook a wider variety of vegetables each week    Comments  He and his wife live with another couple who are not as health conscious, and the families share cooking responsibilites, so eating a diet they prefer is sometimes challenging. They choose whole grain products, eat a variety of fruits/ vegetables, and choose lower sugar dessert items. They recently purchased a dehydrator and are in the process of making their own dried fruit and trail mix without added sugar. They choose SF beverages and eat at home 99% of the time. They choose low sodium options when they are able      Intervention Plan   Intervention  Prescribe, educate and counsel regarding individualized specific dietary modifications aiming towards targeted core components such as weight, hypertension, lipid management, diabetes, heart failure and other comorbidities.;Nutrition handout(s) given to patient.   no-salt seasoning blends handout; low sodium nutrition therapy handout   Expected Outcomes   Short Term Goal: Understand basic principles of dietary content, such as calories, fat, sodium, cholesterol and nutrients.;Short Term Goal: A plan has been developed with personal nutrition goals set during dietitian appointment.;Long Term Goal: Adherence to prescribed nutrition plan.       Nutrition Discharge: Nutrition Assessments - 07/12/18 1110      MEDFICTS Scores   Pre Score  56    Post Score  --   declined nutrition survery      Education Questionnaire Score: Knowledge Questionnaire Score - 07/12/18 1110      Knowledge Questionnaire Score   Pre Score  17/26    Post Score  21/26   reviewed with pt today      Goals reviewed with patient; copy given to patient.

## 2018-07-29 ENCOUNTER — Ambulatory Visit: Payer: Self-pay | Admitting: Urology

## 2018-08-22 DIAGNOSIS — I2102 ST elevation (STEMI) myocardial infarction involving left anterior descending coronary artery: Secondary | ICD-10-CM | POA: Diagnosis not present

## 2018-08-22 DIAGNOSIS — Z955 Presence of coronary angioplasty implant and graft: Secondary | ICD-10-CM | POA: Diagnosis not present

## 2018-08-22 DIAGNOSIS — I10 Essential (primary) hypertension: Secondary | ICD-10-CM | POA: Diagnosis not present

## 2018-10-26 DIAGNOSIS — N390 Urinary tract infection, site not specified: Secondary | ICD-10-CM | POA: Diagnosis not present

## 2018-10-26 DIAGNOSIS — C672 Malignant neoplasm of lateral wall of bladder: Secondary | ICD-10-CM | POA: Diagnosis not present

## 2018-10-26 DIAGNOSIS — R351 Nocturia: Secondary | ICD-10-CM | POA: Diagnosis not present

## 2018-10-26 DIAGNOSIS — N401 Enlarged prostate with lower urinary tract symptoms: Secondary | ICD-10-CM | POA: Diagnosis not present

## 2018-10-26 DIAGNOSIS — R339 Retention of urine, unspecified: Secondary | ICD-10-CM | POA: Diagnosis not present

## 2018-11-07 DIAGNOSIS — J019 Acute sinusitis, unspecified: Secondary | ICD-10-CM | POA: Diagnosis not present

## 2018-11-07 DIAGNOSIS — Z6830 Body mass index (BMI) 30.0-30.9, adult: Secondary | ICD-10-CM | POA: Diagnosis not present

## 2018-11-22 DIAGNOSIS — I2510 Atherosclerotic heart disease of native coronary artery without angina pectoris: Secondary | ICD-10-CM | POA: Diagnosis not present

## 2018-11-22 DIAGNOSIS — I10 Essential (primary) hypertension: Secondary | ICD-10-CM | POA: Diagnosis not present

## 2018-11-22 DIAGNOSIS — N400 Enlarged prostate without lower urinary tract symptoms: Secondary | ICD-10-CM | POA: Diagnosis not present

## 2018-11-22 DIAGNOSIS — Z79899 Other long term (current) drug therapy: Secondary | ICD-10-CM | POA: Diagnosis not present

## 2018-11-22 DIAGNOSIS — R7303 Prediabetes: Secondary | ICD-10-CM | POA: Diagnosis not present

## 2018-11-23 DIAGNOSIS — R351 Nocturia: Secondary | ICD-10-CM | POA: Diagnosis not present

## 2018-11-23 DIAGNOSIS — R310 Gross hematuria: Secondary | ICD-10-CM | POA: Diagnosis not present

## 2018-11-23 DIAGNOSIS — R82998 Other abnormal findings in urine: Secondary | ICD-10-CM | POA: Diagnosis not present

## 2018-11-23 DIAGNOSIS — C672 Malignant neoplasm of lateral wall of bladder: Secondary | ICD-10-CM | POA: Diagnosis not present

## 2018-11-23 DIAGNOSIS — N401 Enlarged prostate with lower urinary tract symptoms: Secondary | ICD-10-CM | POA: Diagnosis not present

## 2018-11-28 DIAGNOSIS — N200 Calculus of kidney: Secondary | ICD-10-CM | POA: Diagnosis not present

## 2018-11-28 DIAGNOSIS — R310 Gross hematuria: Secondary | ICD-10-CM | POA: Diagnosis not present

## 2018-11-28 DIAGNOSIS — K869 Disease of pancreas, unspecified: Secondary | ICD-10-CM | POA: Diagnosis not present

## 2018-11-28 DIAGNOSIS — K769 Liver disease, unspecified: Secondary | ICD-10-CM | POA: Diagnosis not present

## 2018-11-28 DIAGNOSIS — C679 Malignant neoplasm of bladder, unspecified: Secondary | ICD-10-CM | POA: Diagnosis not present

## 2018-12-26 DIAGNOSIS — B029 Zoster without complications: Secondary | ICD-10-CM | POA: Diagnosis not present

## 2018-12-26 DIAGNOSIS — B351 Tinea unguium: Secondary | ICD-10-CM | POA: Diagnosis not present

## 2018-12-26 DIAGNOSIS — Z6831 Body mass index (BMI) 31.0-31.9, adult: Secondary | ICD-10-CM | POA: Diagnosis not present

## 2019-01-16 DIAGNOSIS — Z125 Encounter for screening for malignant neoplasm of prostate: Secondary | ICD-10-CM | POA: Diagnosis not present

## 2019-01-16 DIAGNOSIS — Z6830 Body mass index (BMI) 30.0-30.9, adult: Secondary | ICD-10-CM | POA: Diagnosis not present

## 2019-01-16 DIAGNOSIS — C672 Malignant neoplasm of lateral wall of bladder: Secondary | ICD-10-CM | POA: Diagnosis not present

## 2019-01-16 DIAGNOSIS — Z139 Encounter for screening, unspecified: Secondary | ICD-10-CM | POA: Diagnosis not present

## 2019-01-16 DIAGNOSIS — N401 Enlarged prostate with lower urinary tract symptoms: Secondary | ICD-10-CM | POA: Diagnosis not present

## 2019-01-16 DIAGNOSIS — Z9181 History of falling: Secondary | ICD-10-CM | POA: Diagnosis not present

## 2019-01-16 DIAGNOSIS — E785 Hyperlipidemia, unspecified: Secondary | ICD-10-CM | POA: Diagnosis not present

## 2019-01-16 DIAGNOSIS — Z0000 Encounter for general adult medical examination without abnormal findings: Secondary | ICD-10-CM | POA: Diagnosis not present

## 2019-01-16 DIAGNOSIS — Z1331 Encounter for screening for depression: Secondary | ICD-10-CM | POA: Diagnosis not present

## 2019-01-31 DIAGNOSIS — I2102 ST elevation (STEMI) myocardial infarction involving left anterior descending coronary artery: Secondary | ICD-10-CM | POA: Diagnosis not present

## 2019-01-31 DIAGNOSIS — I10 Essential (primary) hypertension: Secondary | ICD-10-CM | POA: Diagnosis not present

## 2019-01-31 DIAGNOSIS — Z955 Presence of coronary angioplasty implant and graft: Secondary | ICD-10-CM | POA: Diagnosis not present

## 2019-02-06 DIAGNOSIS — Z79899 Other long term (current) drug therapy: Secondary | ICD-10-CM | POA: Diagnosis not present

## 2019-03-20 DIAGNOSIS — Z8551 Personal history of malignant neoplasm of bladder: Secondary | ICD-10-CM | POA: Diagnosis not present

## 2019-03-20 DIAGNOSIS — N401 Enlarged prostate with lower urinary tract symptoms: Secondary | ICD-10-CM | POA: Diagnosis not present

## 2019-03-20 DIAGNOSIS — C672 Malignant neoplasm of lateral wall of bladder: Secondary | ICD-10-CM | POA: Diagnosis not present

## 2019-04-03 DIAGNOSIS — N492 Inflammatory disorders of scrotum: Secondary | ICD-10-CM | POA: Diagnosis not present

## 2019-04-06 DIAGNOSIS — Z6830 Body mass index (BMI) 30.0-30.9, adult: Secondary | ICD-10-CM | POA: Diagnosis not present

## 2019-04-06 DIAGNOSIS — N492 Inflammatory disorders of scrotum: Secondary | ICD-10-CM | POA: Diagnosis not present

## 2019-04-13 DIAGNOSIS — Z6830 Body mass index (BMI) 30.0-30.9, adult: Secondary | ICD-10-CM | POA: Diagnosis not present

## 2019-04-13 DIAGNOSIS — M25531 Pain in right wrist: Secondary | ICD-10-CM | POA: Diagnosis not present

## 2019-04-13 DIAGNOSIS — N492 Inflammatory disorders of scrotum: Secondary | ICD-10-CM | POA: Diagnosis not present

## 2019-05-25 DIAGNOSIS — Z23 Encounter for immunization: Secondary | ICD-10-CM | POA: Diagnosis not present

## 2019-05-25 DIAGNOSIS — I2510 Atherosclerotic heart disease of native coronary artery without angina pectoris: Secondary | ICD-10-CM | POA: Diagnosis not present

## 2019-05-25 DIAGNOSIS — R7303 Prediabetes: Secondary | ICD-10-CM | POA: Diagnosis not present

## 2019-05-25 DIAGNOSIS — Z79899 Other long term (current) drug therapy: Secondary | ICD-10-CM | POA: Diagnosis not present

## 2019-05-25 DIAGNOSIS — I10 Essential (primary) hypertension: Secondary | ICD-10-CM | POA: Diagnosis not present

## 2019-05-26 DIAGNOSIS — M7061 Trochanteric bursitis, right hip: Secondary | ICD-10-CM | POA: Diagnosis not present

## 2019-06-05 DIAGNOSIS — I2510 Atherosclerotic heart disease of native coronary artery without angina pectoris: Secondary | ICD-10-CM | POA: Diagnosis not present

## 2019-06-05 DIAGNOSIS — E7849 Other hyperlipidemia: Secondary | ICD-10-CM | POA: Diagnosis not present

## 2019-06-05 DIAGNOSIS — Z955 Presence of coronary angioplasty implant and graft: Secondary | ICD-10-CM | POA: Diagnosis not present

## 2019-06-05 DIAGNOSIS — I2102 ST elevation (STEMI) myocardial infarction involving left anterior descending coronary artery: Secondary | ICD-10-CM | POA: Diagnosis not present

## 2019-06-05 DIAGNOSIS — I10 Essential (primary) hypertension: Secondary | ICD-10-CM | POA: Diagnosis not present

## 2019-08-15 DIAGNOSIS — B351 Tinea unguium: Secondary | ICD-10-CM | POA: Diagnosis not present

## 2019-08-15 DIAGNOSIS — Z6830 Body mass index (BMI) 30.0-30.9, adult: Secondary | ICD-10-CM | POA: Diagnosis not present

## 2019-08-15 DIAGNOSIS — M7061 Trochanteric bursitis, right hip: Secondary | ICD-10-CM | POA: Diagnosis not present

## 2019-08-15 DIAGNOSIS — Q845 Enlarged and hypertrophic nails: Secondary | ICD-10-CM | POA: Diagnosis not present

## 2019-09-06 IMAGING — DX DG CHEST 1V PORT
1 series · 1 of 1 positions shown · non-contrast
Comparison: None.

CLINICAL DATA: Chest pain

EXAM:
PORTABLE CHEST 1 VIEW

[chest ap]
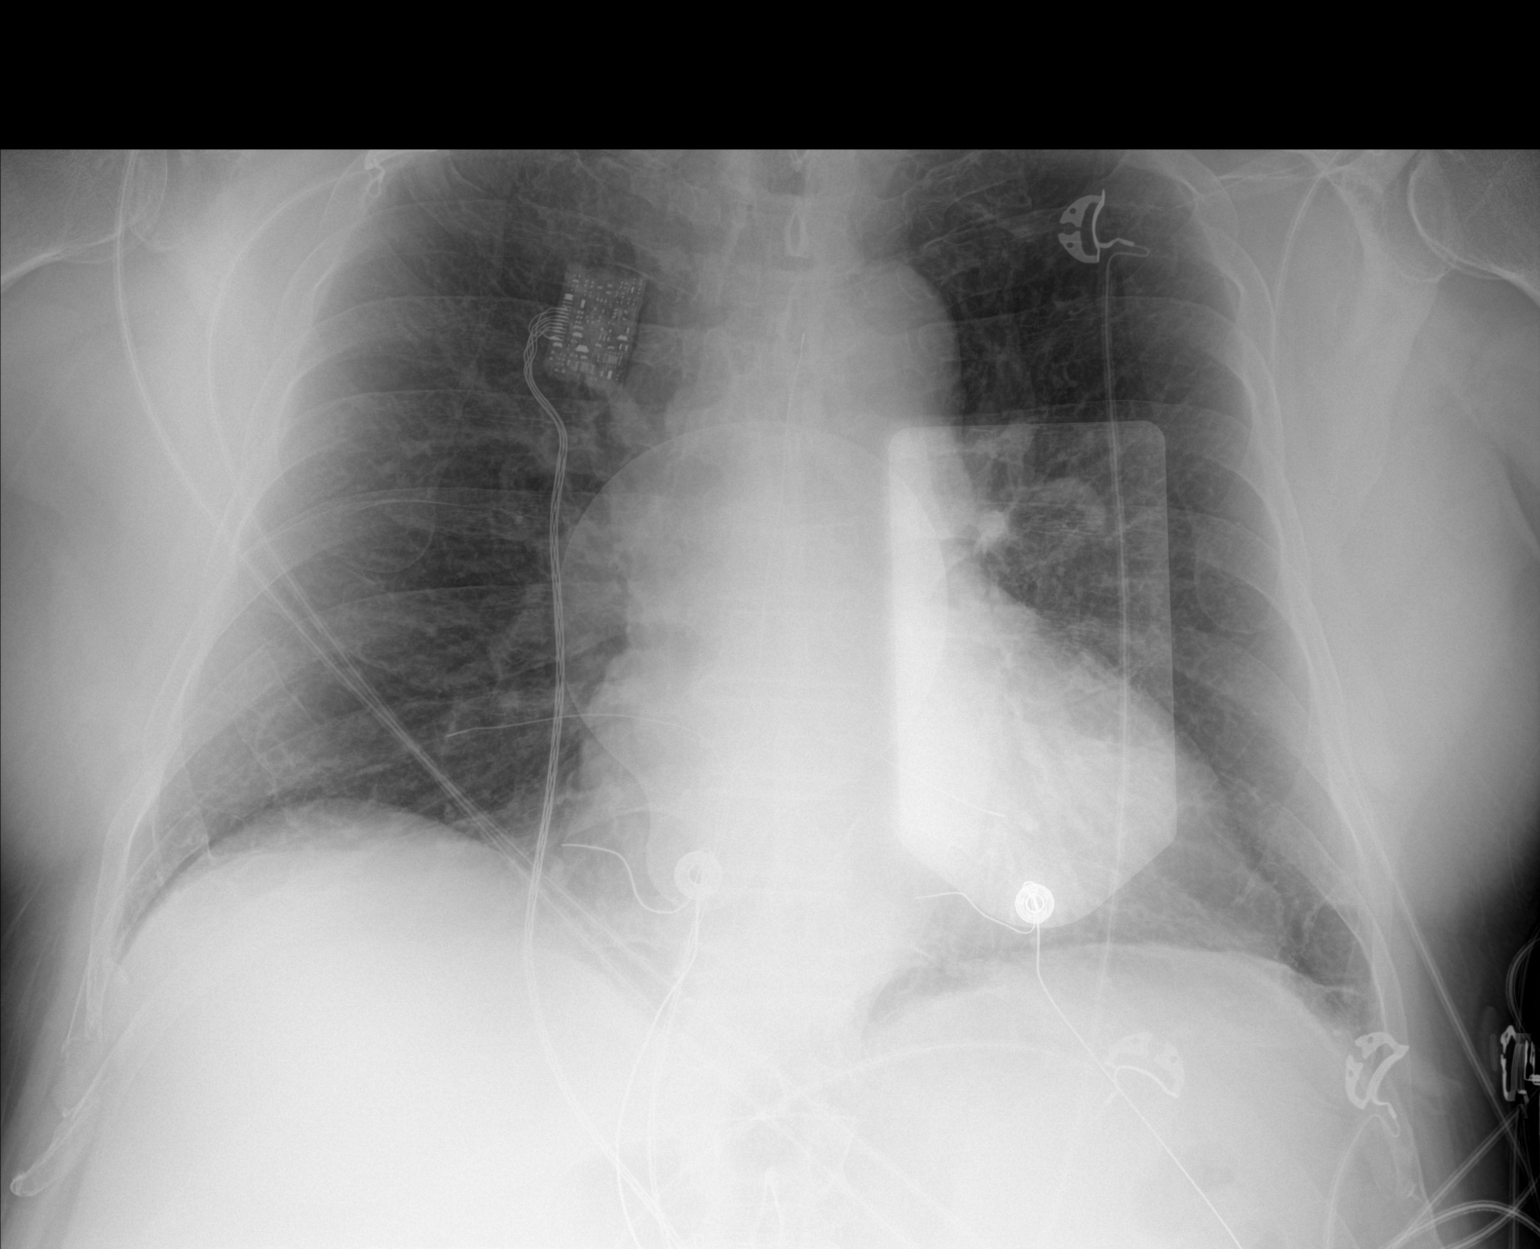

[1 of 1 positions shown; findings below may reference images not displayed]

FINDINGS: The lungs are well inflated. There is mild cardiomegaly.

There is no focal airspace consolidation or pulmonary edema. There
is no pleural effusion or pneumothorax.
IMPRESSION: No active disease.

## 2019-09-20 DIAGNOSIS — C672 Malignant neoplasm of lateral wall of bladder: Secondary | ICD-10-CM | POA: Diagnosis not present

## 2019-09-20 DIAGNOSIS — N401 Enlarged prostate with lower urinary tract symptoms: Secondary | ICD-10-CM | POA: Diagnosis not present

## 2019-10-06 DIAGNOSIS — I2102 ST elevation (STEMI) myocardial infarction involving left anterior descending coronary artery: Secondary | ICD-10-CM | POA: Diagnosis not present

## 2019-10-06 DIAGNOSIS — I10 Essential (primary) hypertension: Secondary | ICD-10-CM | POA: Diagnosis not present

## 2019-10-06 DIAGNOSIS — Z955 Presence of coronary angioplasty implant and graft: Secondary | ICD-10-CM | POA: Diagnosis not present

## 2019-11-02 DIAGNOSIS — M25562 Pain in left knee: Secondary | ICD-10-CM | POA: Diagnosis not present

## 2019-11-02 DIAGNOSIS — R42 Dizziness and giddiness: Secondary | ICD-10-CM | POA: Diagnosis not present

## 2019-11-02 DIAGNOSIS — L02412 Cutaneous abscess of left axilla: Secondary | ICD-10-CM | POA: Diagnosis not present

## 2019-11-02 DIAGNOSIS — M25561 Pain in right knee: Secondary | ICD-10-CM | POA: Diagnosis not present

## 2019-11-16 DIAGNOSIS — Z961 Presence of intraocular lens: Secondary | ICD-10-CM | POA: Diagnosis not present

## 2019-11-24 DIAGNOSIS — Z79899 Other long term (current) drug therapy: Secondary | ICD-10-CM | POA: Diagnosis not present

## 2019-11-24 DIAGNOSIS — I2510 Atherosclerotic heart disease of native coronary artery without angina pectoris: Secondary | ICD-10-CM | POA: Diagnosis not present

## 2019-11-24 DIAGNOSIS — R7303 Prediabetes: Secondary | ICD-10-CM | POA: Diagnosis not present

## 2019-11-24 DIAGNOSIS — N400 Enlarged prostate without lower urinary tract symptoms: Secondary | ICD-10-CM | POA: Diagnosis not present

## 2019-11-24 DIAGNOSIS — I10 Essential (primary) hypertension: Secondary | ICD-10-CM | POA: Diagnosis not present

## 2020-01-18 DIAGNOSIS — W57XXXA Bitten or stung by nonvenomous insect and other nonvenomous arthropods, initial encounter: Secondary | ICD-10-CM | POA: Diagnosis not present

## 2020-01-18 DIAGNOSIS — R21 Rash and other nonspecific skin eruption: Secondary | ICD-10-CM | POA: Diagnosis not present

## 2020-01-18 DIAGNOSIS — Z6833 Body mass index (BMI) 33.0-33.9, adult: Secondary | ICD-10-CM | POA: Diagnosis not present

## 2020-01-22 DIAGNOSIS — Z1331 Encounter for screening for depression: Secondary | ICD-10-CM | POA: Diagnosis not present

## 2020-01-22 DIAGNOSIS — Z6833 Body mass index (BMI) 33.0-33.9, adult: Secondary | ICD-10-CM | POA: Diagnosis not present

## 2020-01-22 DIAGNOSIS — Z125 Encounter for screening for malignant neoplasm of prostate: Secondary | ICD-10-CM | POA: Diagnosis not present

## 2020-01-22 DIAGNOSIS — Z0000 Encounter for general adult medical examination without abnormal findings: Secondary | ICD-10-CM | POA: Diagnosis not present

## 2020-01-22 DIAGNOSIS — E785 Hyperlipidemia, unspecified: Secondary | ICD-10-CM | POA: Diagnosis not present

## 2020-01-22 DIAGNOSIS — Z139 Encounter for screening, unspecified: Secondary | ICD-10-CM | POA: Diagnosis not present

## 2020-01-22 DIAGNOSIS — Z9181 History of falling: Secondary | ICD-10-CM | POA: Diagnosis not present

## 2020-03-06 ENCOUNTER — Other Ambulatory Visit: Payer: Self-pay | Admitting: Neurosurgery

## 2020-03-06 DIAGNOSIS — D333 Benign neoplasm of cranial nerves: Secondary | ICD-10-CM

## 2020-03-20 DIAGNOSIS — N401 Enlarged prostate with lower urinary tract symptoms: Secondary | ICD-10-CM | POA: Diagnosis not present

## 2020-04-16 DIAGNOSIS — I10 Essential (primary) hypertension: Secondary | ICD-10-CM | POA: Diagnosis not present

## 2020-04-16 DIAGNOSIS — R0602 Shortness of breath: Secondary | ICD-10-CM | POA: Diagnosis not present

## 2020-04-16 DIAGNOSIS — R062 Wheezing: Secondary | ICD-10-CM | POA: Diagnosis not present

## 2020-04-16 DIAGNOSIS — J90 Pleural effusion, not elsewhere classified: Secondary | ICD-10-CM | POA: Diagnosis not present

## 2020-04-16 DIAGNOSIS — Z955 Presence of coronary angioplasty implant and graft: Secondary | ICD-10-CM | POA: Diagnosis not present

## 2020-04-16 DIAGNOSIS — I2102 ST elevation (STEMI) myocardial infarction involving left anterior descending coronary artery: Secondary | ICD-10-CM | POA: Diagnosis not present

## 2020-04-16 DIAGNOSIS — I2510 Atherosclerotic heart disease of native coronary artery without angina pectoris: Secondary | ICD-10-CM | POA: Diagnosis not present

## 2020-04-19 DIAGNOSIS — Z6832 Body mass index (BMI) 32.0-32.9, adult: Secondary | ICD-10-CM | POA: Diagnosis not present

## 2020-04-19 DIAGNOSIS — I2510 Atherosclerotic heart disease of native coronary artery without angina pectoris: Secondary | ICD-10-CM | POA: Diagnosis not present

## 2020-04-19 DIAGNOSIS — M25561 Pain in right knee: Secondary | ICD-10-CM | POA: Diagnosis not present

## 2020-06-05 DIAGNOSIS — Z6832 Body mass index (BMI) 32.0-32.9, adult: Secondary | ICD-10-CM | POA: Diagnosis not present

## 2020-06-05 DIAGNOSIS — Z79899 Other long term (current) drug therapy: Secondary | ICD-10-CM | POA: Diagnosis not present

## 2020-06-05 DIAGNOSIS — I2510 Atherosclerotic heart disease of native coronary artery without angina pectoris: Secondary | ICD-10-CM | POA: Diagnosis not present

## 2020-06-05 DIAGNOSIS — I10 Essential (primary) hypertension: Secondary | ICD-10-CM | POA: Diagnosis not present

## 2020-06-05 DIAGNOSIS — N400 Enlarged prostate without lower urinary tract symptoms: Secondary | ICD-10-CM | POA: Diagnosis not present

## 2020-06-05 DIAGNOSIS — R7303 Prediabetes: Secondary | ICD-10-CM | POA: Diagnosis not present

## 2020-06-05 DIAGNOSIS — Z23 Encounter for immunization: Secondary | ICD-10-CM | POA: Diagnosis not present

## 2020-07-03 DIAGNOSIS — R339 Retention of urine, unspecified: Secondary | ICD-10-CM | POA: Diagnosis not present

## 2020-07-03 DIAGNOSIS — N401 Enlarged prostate with lower urinary tract symptoms: Secondary | ICD-10-CM | POA: Diagnosis not present

## 2020-07-10 DIAGNOSIS — Z1389 Encounter for screening for other disorder: Secondary | ICD-10-CM | POA: Diagnosis not present

## 2020-07-10 DIAGNOSIS — M25561 Pain in right knee: Secondary | ICD-10-CM | POA: Diagnosis not present

## 2020-07-10 DIAGNOSIS — G894 Chronic pain syndrome: Secondary | ICD-10-CM | POA: Diagnosis not present

## 2020-07-10 DIAGNOSIS — Z79899 Other long term (current) drug therapy: Secondary | ICD-10-CM | POA: Diagnosis not present

## 2020-08-14 DIAGNOSIS — I10 Essential (primary) hypertension: Secondary | ICD-10-CM | POA: Diagnosis not present

## 2020-08-14 DIAGNOSIS — I2102 ST elevation (STEMI) myocardial infarction involving left anterior descending coronary artery: Secondary | ICD-10-CM | POA: Diagnosis not present

## 2020-08-14 DIAGNOSIS — Z23 Encounter for immunization: Secondary | ICD-10-CM | POA: Diagnosis not present

## 2020-08-14 DIAGNOSIS — Z955 Presence of coronary angioplasty implant and graft: Secondary | ICD-10-CM | POA: Diagnosis not present

## 2020-08-14 DIAGNOSIS — I2510 Atherosclerotic heart disease of native coronary artery without angina pectoris: Secondary | ICD-10-CM | POA: Diagnosis not present
